# Patient Record
Sex: Male | Born: 1960 | Race: Black or African American | Hispanic: No | Marital: Single | Smoking: Never smoker
Health system: Southern US, Community
[De-identification: ages and names within clinical notes are randomized; demographics above are authoritative.]

## PROBLEM LIST (undated history)

## (undated) DIAGNOSIS — I4891 Unspecified atrial fibrillation: Secondary | ICD-10-CM

## (undated) DIAGNOSIS — I10 Essential (primary) hypertension: Secondary | ICD-10-CM

## (undated) DIAGNOSIS — I639 Cerebral infarction, unspecified: Secondary | ICD-10-CM

## (undated) DIAGNOSIS — D61818 Other pancytopenia: Secondary | ICD-10-CM

## (undated) DIAGNOSIS — E039 Hypothyroidism, unspecified: Secondary | ICD-10-CM

## (undated) DIAGNOSIS — D696 Thrombocytopenia, unspecified: Secondary | ICD-10-CM

## (undated) HISTORY — DX: Hypothyroidism, unspecified: E03.9

## (undated) HISTORY — DX: Unspecified atrial fibrillation: I48.91

## (undated) HISTORY — DX: Other pancytopenia: D61.818

## (undated) HISTORY — DX: Thrombocytopenia, unspecified: D69.6

## (undated) HISTORY — DX: Essential (primary) hypertension: I10

## (undated) HISTORY — PX: HERNIA REPAIR: SHX51

## (undated) HISTORY — DX: Cerebral infarction, unspecified: I63.9

---

## 2008-01-04 ENCOUNTER — Emergency Department (HOSPITAL_COMMUNITY): Admission: EM | Admit: 2008-01-04 | Discharge: 2008-01-04 | Payer: Self-pay | Admitting: Emergency Medicine

## 2008-01-05 ENCOUNTER — Ambulatory Visit: Payer: Self-pay | Admitting: Oncology

## 2008-01-10 LAB — COMPREHENSIVE METABOLIC PANEL
ALT: 9 U/L (ref 0–53)
AST: 14 U/L (ref 0–37)
Alkaline Phosphatase: 34 U/L — ABNORMAL LOW (ref 39–117)
CO2: 30 mEq/L (ref 19–32)
Sodium: 138 mEq/L (ref 135–145)
Total Bilirubin: 2.6 mg/dL — ABNORMAL HIGH (ref 0.3–1.2)
Total Protein: 7.4 g/dL (ref 6.0–8.3)

## 2008-01-10 LAB — CBC WITH DIFFERENTIAL/PLATELET
BASO%: 0.4 % (ref 0.0–2.0)
Basophils Absolute: 0 10*3/uL (ref 0.0–0.1)
EOS%: 4.3 % (ref 0.0–7.0)
HGB: 14.7 g/dL (ref 13.0–17.1)
MCH: 33 pg (ref 28.0–33.4)
RDW: 14.6 % (ref 11.2–14.6)
lymph#: 0.8 10*3/uL — ABNORMAL LOW (ref 0.9–3.3)

## 2008-01-10 LAB — TECHNOLOGIST REVIEW

## 2008-03-05 ENCOUNTER — Ambulatory Visit: Payer: Self-pay | Admitting: Oncology

## 2008-05-06 ENCOUNTER — Ambulatory Visit: Payer: Self-pay | Admitting: Oncology

## 2008-07-09 ENCOUNTER — Ambulatory Visit: Payer: Self-pay | Admitting: Oncology

## 2008-07-11 LAB — PROTIME-INR: INR: 2.2 (ref 2.00–3.50)

## 2008-07-11 LAB — CBC WITH DIFFERENTIAL/PLATELET
BASO%: 0.4 % (ref 0.0–2.0)
EOS%: 5.6 % (ref 0.0–7.0)
MCH: 31.4 pg (ref 27.2–33.4)
MCHC: 34.5 g/dL (ref 32.0–36.0)
RBC: 4.42 10*6/uL (ref 4.20–5.82)
RDW: 13.7 % (ref 11.0–14.6)
WBC: 2.5 10*3/uL — ABNORMAL LOW (ref 4.0–10.3)
lymph#: 1 10*3/uL (ref 0.9–3.3)
nRBC: 0 % (ref 0–0)

## 2008-07-11 LAB — CHCC SMEAR

## 2008-07-12 LAB — COMPREHENSIVE METABOLIC PANEL
ALT: 12 U/L (ref 0–53)
AST: 23 U/L (ref 0–37)
Albumin: 3.9 g/dL (ref 3.5–5.2)
Alkaline Phosphatase: 36 U/L — ABNORMAL LOW (ref 39–117)
BUN: 11 mg/dL (ref 6–23)
Potassium: 3.9 mEq/L (ref 3.5–5.3)
Sodium: 138 mEq/L (ref 135–145)
Total Protein: 7.7 g/dL (ref 6.0–8.3)

## 2008-07-12 LAB — ANTI-NUCLEAR AB-TITER (ANA TITER): ANA Titer 1: 1:40 {titer} — ABNORMAL HIGH

## 2008-07-12 LAB — ANA: Anti Nuclear Antibody(ANA): POSITIVE — AB

## 2008-10-04 ENCOUNTER — Ambulatory Visit (HOSPITAL_COMMUNITY): Admission: RE | Admit: 2008-10-04 | Discharge: 2008-10-04 | Payer: Self-pay | Admitting: Oncology

## 2008-10-08 ENCOUNTER — Ambulatory Visit: Payer: Self-pay | Admitting: Oncology

## 2008-10-10 LAB — CBC WITH DIFFERENTIAL/PLATELET
BASO%: 1.9 % (ref 0.0–2.0)
Basophils Absolute: 0 10*3/uL (ref 0.0–0.1)
EOS%: 6.6 % (ref 0.0–7.0)
HGB: 15.1 g/dL (ref 13.0–17.1)
MCH: 33.7 pg — ABNORMAL HIGH (ref 27.2–33.4)
MCHC: 35.3 g/dL (ref 32.0–36.0)
MCV: 95.5 fL (ref 79.3–98.0)
MONO%: 17.7 % — ABNORMAL HIGH (ref 0.0–14.0)
RBC: 4.47 10*6/uL (ref 4.20–5.82)
RDW: 14.9 % — ABNORMAL HIGH (ref 11.0–14.6)
lymph#: 0.9 10*3/uL (ref 0.9–3.3)

## 2008-10-14 LAB — COMPREHENSIVE METABOLIC PANEL
ALT: 12 U/L (ref 0–53)
AST: 18 U/L (ref 0–37)
Albumin: 3.9 g/dL (ref 3.5–5.2)
Alkaline Phosphatase: 35 U/L — ABNORMAL LOW (ref 39–117)
BUN: 14 mg/dL (ref 6–23)
Chloride: 103 mEq/L (ref 96–112)
Potassium: 3.7 mEq/L (ref 3.5–5.3)

## 2008-10-14 LAB — SPEP & IFE WITH QIG
Albumin ELP: 49.7 % — ABNORMAL LOW (ref 55.8–66.1)
Alpha-1-Globulin: 3.8 % (ref 2.9–4.9)
Alpha-2-Globulin: 8.9 % (ref 7.1–11.8)
IgM, Serum: 77 mg/dL (ref 60–263)

## 2008-11-22 ENCOUNTER — Ambulatory Visit: Payer: Self-pay | Admitting: Oncology

## 2008-11-25 ENCOUNTER — Encounter: Payer: Self-pay | Admitting: Oncology

## 2008-11-25 ENCOUNTER — Ambulatory Visit: Payer: Self-pay | Admitting: Oncology

## 2008-11-25 ENCOUNTER — Ambulatory Visit (HOSPITAL_COMMUNITY): Admission: RE | Admit: 2008-11-25 | Discharge: 2008-11-25 | Payer: Self-pay | Admitting: Oncology

## 2008-11-29 LAB — CBC WITH DIFFERENTIAL/PLATELET
BASO%: 1 % (ref 0.0–2.0)
Basophils Absolute: 0 10*3/uL (ref 0.0–0.1)
EOS%: 6.2 % (ref 0.0–7.0)
HCT: 42.6 % (ref 38.4–49.9)
HGB: 14.7 g/dL (ref 13.0–17.1)
LYMPH%: 32.6 % (ref 14.0–49.0)
MCH: 32.9 pg (ref 27.2–33.4)
MCHC: 34.4 g/dL (ref 32.0–36.0)
MCV: 95.7 fL (ref 79.3–98.0)
MONO%: 16.1 % — ABNORMAL HIGH (ref 0.0–14.0)
NEUT%: 44.1 % (ref 39.0–75.0)
Platelets: 56 10*3/uL — ABNORMAL LOW (ref 140–400)

## 2009-03-31 ENCOUNTER — Ambulatory Visit: Payer: Self-pay | Admitting: Oncology

## 2009-04-02 LAB — CBC WITH DIFFERENTIAL/PLATELET
BASO%: 0.4 % (ref 0.0–2.0)
EOS%: 5.5 % (ref 0.0–7.0)
LYMPH%: 37.9 % (ref 14.0–49.0)
MCH: 32.2 pg (ref 27.2–33.4)
MCHC: 33.9 g/dL (ref 32.0–36.0)
MONO#: 0.4 10*3/uL (ref 0.1–0.9)
NEUT%: 41.1 % (ref 39.0–75.0)
RBC: 4.04 10*6/uL — ABNORMAL LOW (ref 4.20–5.82)
WBC: 2.7 10*3/uL — ABNORMAL LOW (ref 4.0–10.3)
lymph#: 1 10*3/uL (ref 0.9–3.3)
nRBC: 0 % (ref 0–0)

## 2009-04-02 LAB — COMPREHENSIVE METABOLIC PANEL
ALT: 11 U/L (ref 0–53)
AST: 16 U/L (ref 0–37)
Alkaline Phosphatase: 40 U/L (ref 39–117)
BUN: 11 mg/dL (ref 6–23)
Calcium: 8.1 mg/dL — ABNORMAL LOW (ref 8.4–10.5)
Chloride: 101 mEq/L (ref 96–112)
Creatinine, Ser: 0.94 mg/dL (ref 0.40–1.50)

## 2009-07-30 ENCOUNTER — Ambulatory Visit: Payer: Self-pay | Admitting: Oncology

## 2009-09-04 ENCOUNTER — Ambulatory Visit: Payer: Self-pay | Admitting: Oncology

## 2009-09-05 LAB — CBC WITH DIFFERENTIAL/PLATELET
BASO%: 0.3 % (ref 0.0–2.0)
EOS%: 6.2 % (ref 0.0–7.0)
MCH: 31.9 pg (ref 27.2–33.4)
MCHC: 33.9 g/dL (ref 32.0–36.0)
MONO#: 0.5 10*3/uL (ref 0.1–0.9)
RBC: 4.04 10*6/uL — ABNORMAL LOW (ref 4.20–5.82)
RDW: 14.5 % (ref 11.0–14.6)
WBC: 2.9 10*3/uL — ABNORMAL LOW (ref 4.0–10.3)
lymph#: 1.1 10*3/uL (ref 0.9–3.3)
nRBC: 0 % (ref 0–0)

## 2009-09-05 LAB — COMPREHENSIVE METABOLIC PANEL
ALT: 15 U/L (ref 0–53)
AST: 22 U/L (ref 0–37)
Albumin: 3.6 g/dL (ref 3.5–5.2)
Alkaline Phosphatase: 35 U/L — ABNORMAL LOW (ref 39–117)
Calcium: 8.6 mg/dL (ref 8.4–10.5)
Chloride: 106 mEq/L (ref 96–112)
Potassium: 3.9 mEq/L (ref 3.5–5.3)

## 2010-02-03 ENCOUNTER — Ambulatory Visit: Payer: Self-pay | Admitting: Oncology

## 2010-02-05 LAB — COMPREHENSIVE METABOLIC PANEL
ALT: 12 U/L (ref 0–53)
Albumin: 3.7 g/dL (ref 3.5–5.2)
CO2: 32 mEq/L (ref 19–32)
Calcium: 8.9 mg/dL (ref 8.4–10.5)
Chloride: 103 mEq/L (ref 96–112)
Creatinine, Ser: 0.92 mg/dL (ref 0.40–1.50)
Potassium: 4 mEq/L (ref 3.5–5.3)
Sodium: 139 mEq/L (ref 135–145)
Total Protein: 8.2 g/dL (ref 6.0–8.3)

## 2010-02-05 LAB — CBC WITH DIFFERENTIAL/PLATELET
Eosinophils Absolute: 0.2 10*3/uL (ref 0.0–0.5)
HCT: 39.8 % (ref 38.4–49.9)
LYMPH%: 30.6 % (ref 14.0–49.0)
MONO#: 0.4 10*3/uL (ref 0.1–0.9)
NEUT#: 1.4 10*3/uL — ABNORMAL LOW (ref 1.5–6.5)
Platelets: 54 10*3/uL — ABNORMAL LOW (ref 140–400)
RBC: 4.24 10*6/uL (ref 4.20–5.82)
WBC: 2.8 10*3/uL — ABNORMAL LOW (ref 4.0–10.3)
lymph#: 0.9 10*3/uL (ref 0.9–3.3)
nRBC: 0 % (ref 0–0)

## 2010-05-10 ENCOUNTER — Encounter: Payer: Self-pay | Admitting: Oncology

## 2010-07-25 LAB — DIFFERENTIAL
Basophils Absolute: 0 10*3/uL (ref 0.0–0.1)
Lymphocytes Relative: 36 % (ref 12–46)
Monocytes Absolute: 0.4 10*3/uL (ref 0.1–1.0)
Neutro Abs: 1.3 10*3/uL — ABNORMAL LOW (ref 1.7–7.7)
Neutrophils Relative %: 44 % (ref 43–77)

## 2010-07-25 LAB — CBC
HCT: 41.8 % (ref 39.0–52.0)
Hemoglobin: 14 g/dL (ref 13.0–17.0)
MCHC: 33.4 g/dL (ref 30.0–36.0)
Platelets: 60 10*3/uL — ABNORMAL LOW (ref 150–400)
RDW: 14.9 % (ref 11.5–15.5)

## 2010-07-25 LAB — CHROMOSOME ANALYSIS, BONE MARROW

## 2010-07-25 LAB — BONE MARROW EXAM

## 2010-08-07 ENCOUNTER — Other Ambulatory Visit: Payer: Self-pay | Admitting: Oncology

## 2010-08-07 ENCOUNTER — Encounter (HOSPITAL_BASED_OUTPATIENT_CLINIC_OR_DEPARTMENT_OTHER): Payer: Medicaid Other | Admitting: Oncology

## 2010-08-07 DIAGNOSIS — D696 Thrombocytopenia, unspecified: Secondary | ICD-10-CM

## 2010-08-07 DIAGNOSIS — D709 Neutropenia, unspecified: Secondary | ICD-10-CM

## 2010-08-07 DIAGNOSIS — R7989 Other specified abnormal findings of blood chemistry: Secondary | ICD-10-CM

## 2010-08-07 LAB — CBC WITH DIFFERENTIAL/PLATELET
BASO%: 0.3 % (ref 0.0–2.0)
Eosinophils Absolute: 0.1 10*3/uL (ref 0.0–0.5)
MONO#: 0.4 10*3/uL (ref 0.1–0.9)
NEUT#: 1.6 10*3/uL (ref 1.5–6.5)
Platelets: 60 10*3/uL — ABNORMAL LOW (ref 140–400)
RBC: 4.12 10*6/uL — ABNORMAL LOW (ref 4.20–5.82)
RDW: 14 % (ref 11.0–14.6)
WBC: 3.1 10*3/uL — ABNORMAL LOW (ref 4.0–10.3)
lymph#: 1 10*3/uL (ref 0.9–3.3)
nRBC: 0 % (ref 0–0)

## 2010-08-07 LAB — COMPREHENSIVE METABOLIC PANEL
ALT: 13 U/L (ref 0–53)
AST: 21 U/L (ref 0–37)
CO2: 27 mEq/L (ref 19–32)
Calcium: 8.5 mg/dL (ref 8.4–10.5)
Chloride: 105 mEq/L (ref 96–112)
Potassium: 4.1 mEq/L (ref 3.5–5.3)
Sodium: 137 mEq/L (ref 135–145)
Total Protein: 7.8 g/dL (ref 6.0–8.3)

## 2010-09-01 NOTE — Op Note (Signed)
Bryce Hill, Bryce Hill             ACCOUNT NO.:  [id number]   MEDICAL RECORD NO.:  [id number]          PATIENT TYPE:  AMB   LOCATION:  OMED                         FACILITY:  [facility]   PHYSICIAN:  Firas N. Shadad        DATE OF BIRTH:  [birth year]   DATE OF PROCEDURE:  11/25/2008  DATE OF DISCHARGE:  11/25/2008                               OPERATIVE REPORT   INDICATION:  A 50 year old gentleman with neutropenia and  thrombocytopenia and bone marrow biopsy to rule out any infiltrative  bone marrow process.   DESCRIPTION OF PROCEDURE:  The risks and benefits of procedure were  described to [name].  Informed consent was obtained afterwards.  The patient was brought into short stay at [facility].  IV  access was obtained, and cardiac monitoring was established.  The  patient was placed in the decubitus position exposing his left iliac  crest.  Skin was prepped and draped in a sterile fashion using Betadine  and sterile drapes.  Conscious sedation was obtained utilizing 4 mg of  Versed.  Skin, subcutaneous tissue, and periosteum was anesthetized  using 2% lidocaine.  Using #11 blade scalpel, a small incision was made.  Using the aspirate needle, aspirate was obtained without any difficulty.  The specimen was sent for flow and cytogenetics.  Using the Jamshidi  needle, biopsy was obtained without any difficulty.  The patient  tolerated the procedure well, and there was no bleeding or any other  complications.  The patient was instructed to lie flat for the next 45  minutes.  The patient was to follow-up on August 13 to discuss the  results.           ______________________________  Blenda Nicely [facility]  Electronically Signed     FNS/MEDQ  D:  11/26/2008  T:  11/26/2008  Job:  [id number]

## 2011-01-18 LAB — CBC
Hemoglobin: 15.6
MCHC: 33.8
MCV: 98.2
RBC: 4.71
RDW: 15

## 2011-01-18 LAB — DIFFERENTIAL
Basophils Absolute: 0
Basophils Relative: 1
Eosinophils Absolute: 0.2
Eosinophils Relative: 7 — ABNORMAL HIGH
Monocytes Absolute: 0.4
Monocytes Relative: 15 — ABNORMAL HIGH

## 2011-02-10 ENCOUNTER — Encounter (HOSPITAL_BASED_OUTPATIENT_CLINIC_OR_DEPARTMENT_OTHER): Payer: Medicaid Other | Admitting: Oncology

## 2011-02-10 ENCOUNTER — Other Ambulatory Visit: Payer: Self-pay | Admitting: Oncology

## 2011-02-10 ENCOUNTER — Encounter: Payer: Self-pay | Admitting: *Deleted

## 2011-02-10 DIAGNOSIS — D709 Neutropenia, unspecified: Secondary | ICD-10-CM

## 2011-02-10 DIAGNOSIS — D696 Thrombocytopenia, unspecified: Secondary | ICD-10-CM

## 2011-02-10 DIAGNOSIS — R7989 Other specified abnormal findings of blood chemistry: Secondary | ICD-10-CM

## 2011-02-10 LAB — COMPREHENSIVE METABOLIC PANEL
Albumin: 4 g/dL (ref 3.5–5.2)
BUN: 16 mg/dL (ref 6–23)
CO2: 22 mEq/L (ref 19–32)
Calcium: 8.7 mg/dL (ref 8.4–10.5)
Chloride: 107 mEq/L (ref 96–112)
Creatinine, Ser: 0.88 mg/dL (ref 0.50–1.35)
Potassium: 3.9 mEq/L (ref 3.5–5.3)

## 2011-02-10 LAB — CBC WITH DIFFERENTIAL/PLATELET
Basophils Absolute: 0 10*3/uL (ref 0.0–0.1)
EOS%: 2.3 % (ref 0.0–7.0)
Eosinophils Absolute: 0.1 10*3/uL (ref 0.0–0.5)
HCT: 35.2 % — ABNORMAL LOW (ref 38.4–49.9)
MCH: 31.6 pg (ref 27.2–33.4)
MCHC: 33.8 g/dL (ref 32.0–36.0)
MCV: 93.4 fL (ref 79.3–98.0)
Platelets: 61 10*3/uL — ABNORMAL LOW (ref 140–400)
RBC: 3.77 10*6/uL — ABNORMAL LOW (ref 4.20–5.82)
WBC: 2.6 10*3/uL — ABNORMAL LOW (ref 4.0–10.3)
lymph#: 0.9 10*3/uL (ref 0.9–3.3)
nRBC: 0 % (ref 0–0)

## 2011-05-14 DIAGNOSIS — I4891 Unspecified atrial fibrillation: Secondary | ICD-10-CM | POA: Diagnosis not present

## 2011-05-28 DIAGNOSIS — I10 Essential (primary) hypertension: Secondary | ICD-10-CM | POA: Diagnosis not present

## 2011-05-28 DIAGNOSIS — Z6832 Body mass index (BMI) 32.0-32.9, adult: Secondary | ICD-10-CM | POA: Diagnosis not present

## 2011-05-28 DIAGNOSIS — E038 Other specified hypothyroidism: Secondary | ICD-10-CM | POA: Diagnosis not present

## 2011-06-08 DIAGNOSIS — I669 Occlusion and stenosis of unspecified cerebral artery: Secondary | ICD-10-CM | POA: Diagnosis not present

## 2011-06-08 DIAGNOSIS — E039 Hypothyroidism, unspecified: Secondary | ICD-10-CM | POA: Diagnosis not present

## 2011-06-08 DIAGNOSIS — E782 Mixed hyperlipidemia: Secondary | ICD-10-CM | POA: Diagnosis not present

## 2011-06-08 DIAGNOSIS — I6789 Other cerebrovascular disease: Secondary | ICD-10-CM | POA: Diagnosis not present

## 2011-06-08 DIAGNOSIS — D696 Thrombocytopenia, unspecified: Secondary | ICD-10-CM | POA: Diagnosis not present

## 2011-06-08 DIAGNOSIS — I10 Essential (primary) hypertension: Secondary | ICD-10-CM | POA: Diagnosis not present

## 2011-06-08 DIAGNOSIS — Z79899 Other long term (current) drug therapy: Secondary | ICD-10-CM | POA: Diagnosis not present

## 2011-06-08 DIAGNOSIS — I4891 Unspecified atrial fibrillation: Secondary | ICD-10-CM | POA: Diagnosis not present

## 2011-06-09 DIAGNOSIS — S9030XA Contusion of unspecified foot, initial encounter: Secondary | ICD-10-CM | POA: Diagnosis not present

## 2011-07-08 DIAGNOSIS — I119 Hypertensive heart disease without heart failure: Secondary | ICD-10-CM | POA: Diagnosis not present

## 2011-07-08 DIAGNOSIS — E039 Hypothyroidism, unspecified: Secondary | ICD-10-CM | POA: Diagnosis not present

## 2011-07-08 DIAGNOSIS — I669 Occlusion and stenosis of unspecified cerebral artery: Secondary | ICD-10-CM | POA: Diagnosis not present

## 2011-07-08 DIAGNOSIS — I4891 Unspecified atrial fibrillation: Secondary | ICD-10-CM | POA: Diagnosis not present

## 2011-08-05 DIAGNOSIS — I119 Hypertensive heart disease without heart failure: Secondary | ICD-10-CM | POA: Diagnosis not present

## 2011-08-05 DIAGNOSIS — I4891 Unspecified atrial fibrillation: Secondary | ICD-10-CM | POA: Diagnosis not present

## 2011-08-05 DIAGNOSIS — E039 Hypothyroidism, unspecified: Secondary | ICD-10-CM | POA: Diagnosis not present

## 2011-08-05 DIAGNOSIS — I669 Occlusion and stenosis of unspecified cerebral artery: Secondary | ICD-10-CM | POA: Diagnosis not present

## 2011-08-19 ENCOUNTER — Telehealth: Payer: Self-pay | Admitting: Oncology

## 2011-08-19 ENCOUNTER — Other Ambulatory Visit (HOSPITAL_BASED_OUTPATIENT_CLINIC_OR_DEPARTMENT_OTHER): Payer: Medicaid Other | Admitting: Lab

## 2011-08-19 ENCOUNTER — Ambulatory Visit (HOSPITAL_BASED_OUTPATIENT_CLINIC_OR_DEPARTMENT_OTHER): Payer: Medicaid Other | Admitting: Oncology

## 2011-08-19 VITALS — BP 121/67 | HR 60 | Temp 97.7°F | Ht 70.5 in | Wt 236.9 lb

## 2011-08-19 DIAGNOSIS — D696 Thrombocytopenia, unspecified: Secondary | ICD-10-CM | POA: Diagnosis not present

## 2011-08-19 DIAGNOSIS — R7989 Other specified abnormal findings of blood chemistry: Secondary | ICD-10-CM | POA: Diagnosis not present

## 2011-08-19 DIAGNOSIS — D709 Neutropenia, unspecified: Secondary | ICD-10-CM | POA: Diagnosis not present

## 2011-08-19 DIAGNOSIS — D72810 Lymphocytopenia: Secondary | ICD-10-CM | POA: Diagnosis not present

## 2011-08-19 LAB — CBC WITH DIFFERENTIAL/PLATELET
BASO%: 0.9 % (ref 0.0–2.0)
Basophils Absolute: 0 10*3/uL (ref 0.0–0.1)
EOS%: 4.7 % (ref 0.0–7.0)
HCT: 38.9 % (ref 38.4–49.9)
HGB: 13.2 g/dL (ref 13.0–17.1)
MCH: 33.1 pg (ref 27.2–33.4)
MCHC: 33.9 g/dL (ref 32.0–36.0)
MONO#: 0.4 10*3/uL (ref 0.1–0.9)
NEUT%: 49.8 % (ref 39.0–75.0)
RDW: 14.5 % (ref 11.0–14.6)
WBC: 2.8 10*3/uL — ABNORMAL LOW (ref 4.0–10.3)
lymph#: 0.9 10*3/uL (ref 0.9–3.3)

## 2011-08-19 LAB — COMPREHENSIVE METABOLIC PANEL
AST: 21 U/L (ref 0–37)
Albumin: 3.7 g/dL (ref 3.5–5.2)
Alkaline Phosphatase: 41 U/L (ref 39–117)
BUN: 15 mg/dL (ref 6–23)
Calcium: 8.8 mg/dL (ref 8.4–10.5)
Chloride: 101 mEq/L (ref 96–112)
Potassium: 3.8 mEq/L (ref 3.5–5.3)
Sodium: 136 mEq/L (ref 135–145)
Total Protein: 8.3 g/dL (ref 6.0–8.3)

## 2011-08-19 NOTE — Telephone Encounter (Signed)
Gv pt appt for nov2013 °

## 2011-08-19 NOTE — Progress Notes (Signed)
Hematology and Oncology Follow Up Visit  Bryce Hill [id number] Apr 05, 1961 51 y.o. 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  Prior Therapy:  Status post bone marrow biopsy done in August 2010, did not show any infiltrative bone marrow involvement.  Current therapy: Observation and surveillance.  Interim History:  Bryce Hill presents today for a followup visit, a very pleasant gentleman, continued to reside in a rest home and has not really had any recent illnesses or hospitalization.  He has continued to perform activities of daily living without any hindrance or decline.  He had recent teeth extraction and dental work and was not really complicated with any excessive bleeding.  Other than that, had not had any hospitalization, had not had any illnesses, had not any constitutional symptoms. No other bleeding episods noted.   Medications: I have reviewed the patient's current medications. Current outpatient prescriptions:amLODipine (NORVASC) 5 MG tablet, Take 5 mg by mouth daily.  , Disp: , Rfl: ;  carvedilol (COREG) 12.5 MG tablet, Take 12.5 mg by mouth 2 (two) times daily with a meal.  , Disp: , Rfl: ;  levothyroxine (SYNTHROID, LEVOTHROID) 100 MCG tablet, Take 100 mcg by mouth daily.  , Disp: , Rfl: ;  potassium chloride SA (K-DUR,KLOR-CON) 20 MEQ tablet, Take 20 mEq by mouth 2 (two) times daily.  , Disp: , Rfl:  Psyllium (QC FIBER LAXATIVE PO), Take 625 mg by mouth daily. 2 tabs , Disp: , Rfl: ;  warfarin (COUMADIN) 5 MG tablet, Take 5 mg by mouth daily.  , Disp: , Rfl:   Allergies: Allergies no known allergies  Past Medical History, Surgical history, Social history, and Family History were reviewed and updated.  Review of Systems: Constitutional:  Negative for fever, chills, night sweats, anorexia, weight loss, pain. Cardiovascular: no  chest pain or dyspnea on exertion Respiratory: negative Neurological: negative Dermatological: negative ENT: negative Skin: Negative. Gastrointestinal: no abdominal pain, change in bowel habits, or black or bloody stools Genito-Urinary: negative Hematological and Lymphatic: negative Breast: negative Musculoskeletal: negative Remaining ROS negative. Physical Exam: Blood pressure 121/67, pulse 60, temperature 97.7 F (36.5 C), temperature source Oral, height 5' 10.5" (1.791 m), weight 236 lb 14.4 oz (107.457 kg). ECOG: 1 General appearance: alert Head: Normocephalic, without obvious abnormality, atraumatic Neck: no adenopathy, no carotid bruit, no JVD, supple, symmetrical, trachea midline and thyroid not enlarged, symmetric, no tenderness/mass/nodules Lymph nodes: Cervical, supraclavicular, and axillary nodes normal. Heart:regular rate and rhythm, S1, S2 normal, no murmur, click, rub or gallop Lung:chest clear, no wheezing, rales, normal symmetric air entry Abdomin: soft, non-tender, without masses or organomegaly EXT:no erythema, induration, or nodules   Lab Results: Lab Results  Component Value Date   WBC 2.8* 08/19/2011   HGB 13.2 08/19/2011   HCT 38.9 08/19/2011   MCV 97.6 08/19/2011   PLT 71* 08/19/2011     Chemistry      Component Value Date/Time   NA 140 02/10/2011 1438   K 3.9 02/10/2011 1438   CL 107 02/10/2011 1438   CO2 22 02/10/2011 1438   BUN 16 02/10/2011 1438   CREATININE 0.88 02/10/2011 1438      Component Value Date/Time   CALCIUM 8.7 02/10/2011 1438   ALKPHOS 36* 02/10/2011 1438   AST 18 02/10/2011 1438   ALT 9 02/10/2011 1438   BILITOT 1.4* 02/10/2011 1438      Impression and Plan:  A 51 year old gentleman with the following issues: 1. Thrombocytopenia.  Differential diagnosis includes myelodysplasia versus autoimmune disorder or possibly also sequestration due to hypersplenism.  For the time being, his counts have been adequate for the last 2.5 years,  no need for any intervention at this time.  He does not really require any transfusion support at this time. 2. Followup will be in 6 months' time.  Eli Hose, MD 5/2/20133:49 PM

## 2011-08-23 DIAGNOSIS — S8000XA Contusion of unspecified knee, initial encounter: Secondary | ICD-10-CM | POA: Diagnosis not present

## 2011-08-23 DIAGNOSIS — I10 Essential (primary) hypertension: Secondary | ICD-10-CM | POA: Diagnosis not present

## 2011-08-23 DIAGNOSIS — E038 Other specified hypothyroidism: Secondary | ICD-10-CM | POA: Diagnosis not present

## 2011-08-23 DIAGNOSIS — M2550 Pain in unspecified joint: Secondary | ICD-10-CM | POA: Diagnosis not present

## 2011-08-26 DIAGNOSIS — S8000XA Contusion of unspecified knee, initial encounter: Secondary | ICD-10-CM | POA: Diagnosis not present

## 2011-08-26 DIAGNOSIS — M25569 Pain in unspecified knee: Secondary | ICD-10-CM | POA: Diagnosis not present

## 2011-09-08 DIAGNOSIS — I4891 Unspecified atrial fibrillation: Secondary | ICD-10-CM | POA: Diagnosis not present

## 2011-09-08 DIAGNOSIS — E039 Hypothyroidism, unspecified: Secondary | ICD-10-CM | POA: Diagnosis not present

## 2011-09-08 DIAGNOSIS — I119 Hypertensive heart disease without heart failure: Secondary | ICD-10-CM | POA: Diagnosis not present

## 2011-09-08 DIAGNOSIS — I669 Occlusion and stenosis of unspecified cerebral artery: Secondary | ICD-10-CM | POA: Diagnosis not present

## 2011-09-17 DIAGNOSIS — E039 Hypothyroidism, unspecified: Secondary | ICD-10-CM | POA: Diagnosis not present

## 2011-09-17 DIAGNOSIS — I10 Essential (primary) hypertension: Secondary | ICD-10-CM | POA: Diagnosis not present

## 2011-09-17 DIAGNOSIS — K5901 Slow transit constipation: Secondary | ICD-10-CM | POA: Diagnosis not present

## 2011-09-17 DIAGNOSIS — K5900 Constipation, unspecified: Secondary | ICD-10-CM | POA: Diagnosis not present

## 2011-09-17 DIAGNOSIS — I6789 Other cerebrovascular disease: Secondary | ICD-10-CM | POA: Diagnosis not present

## 2011-09-17 DIAGNOSIS — R7309 Other abnormal glucose: Secondary | ICD-10-CM | POA: Diagnosis not present

## 2011-09-29 DIAGNOSIS — I6789 Other cerebrovascular disease: Secondary | ICD-10-CM | POA: Diagnosis not present

## 2011-09-29 DIAGNOSIS — E039 Hypothyroidism, unspecified: Secondary | ICD-10-CM | POA: Diagnosis not present

## 2011-09-29 DIAGNOSIS — I10 Essential (primary) hypertension: Secondary | ICD-10-CM | POA: Diagnosis not present

## 2011-10-04 DIAGNOSIS — E039 Hypothyroidism, unspecified: Secondary | ICD-10-CM | POA: Diagnosis not present

## 2011-10-04 DIAGNOSIS — Z79899 Other long term (current) drug therapy: Secondary | ICD-10-CM | POA: Diagnosis not present

## 2011-10-04 DIAGNOSIS — I4891 Unspecified atrial fibrillation: Secondary | ICD-10-CM | POA: Diagnosis not present

## 2011-10-04 DIAGNOSIS — I10 Essential (primary) hypertension: Secondary | ICD-10-CM | POA: Diagnosis not present

## 2011-10-04 DIAGNOSIS — I619 Nontraumatic intracerebral hemorrhage, unspecified: Secondary | ICD-10-CM | POA: Diagnosis not present

## 2011-10-04 DIAGNOSIS — D696 Thrombocytopenia, unspecified: Secondary | ICD-10-CM | POA: Diagnosis not present

## 2011-10-07 DIAGNOSIS — I119 Hypertensive heart disease without heart failure: Secondary | ICD-10-CM | POA: Diagnosis not present

## 2011-10-22 DIAGNOSIS — I4891 Unspecified atrial fibrillation: Secondary | ICD-10-CM | POA: Diagnosis not present

## 2011-11-02 DIAGNOSIS — E039 Hypothyroidism, unspecified: Secondary | ICD-10-CM | POA: Diagnosis not present

## 2011-11-09 DIAGNOSIS — I119 Hypertensive heart disease without heart failure: Secondary | ICD-10-CM | POA: Diagnosis not present

## 2011-12-10 DIAGNOSIS — I4891 Unspecified atrial fibrillation: Secondary | ICD-10-CM | POA: Diagnosis not present

## 2011-12-10 DIAGNOSIS — I119 Hypertensive heart disease without heart failure: Secondary | ICD-10-CM | POA: Diagnosis not present

## 2011-12-14 ENCOUNTER — Encounter (HOSPITAL_COMMUNITY): Payer: Self-pay | Admitting: Oncology

## 2011-12-14 ENCOUNTER — Encounter (HOSPITAL_COMMUNITY): Payer: Medicare Other | Attending: Oncology | Admitting: Oncology

## 2011-12-14 VITALS — BP 109/69 | HR 62 | Temp 97.3°F | Resp 18 | Ht 69.0 in | Wt 231.8 lb

## 2011-12-14 DIAGNOSIS — I10 Essential (primary) hypertension: Secondary | ICD-10-CM | POA: Diagnosis not present

## 2011-12-14 DIAGNOSIS — I69998 Other sequelae following unspecified cerebrovascular disease: Secondary | ICD-10-CM | POA: Insufficient documentation

## 2011-12-14 DIAGNOSIS — D696 Thrombocytopenia, unspecified: Secondary | ICD-10-CM

## 2011-12-14 DIAGNOSIS — R5381 Other malaise: Secondary | ICD-10-CM | POA: Diagnosis not present

## 2011-12-14 DIAGNOSIS — E669 Obesity, unspecified: Secondary | ICD-10-CM | POA: Diagnosis not present

## 2011-12-14 DIAGNOSIS — I69928 Other speech and language deficits following unspecified cerebrovascular disease: Secondary | ICD-10-CM | POA: Diagnosis not present

## 2011-12-14 DIAGNOSIS — R5383 Other fatigue: Secondary | ICD-10-CM | POA: Insufficient documentation

## 2011-12-14 DIAGNOSIS — D72819 Decreased white blood cell count, unspecified: Secondary | ICD-10-CM | POA: Diagnosis not present

## 2011-12-14 LAB — CBC WITH DIFFERENTIAL/PLATELET
Basophils Relative: 1 % (ref 0–1)
Eosinophils Relative: 5 % (ref 0–5)
Hemoglobin: 12.3 g/dL — ABNORMAL LOW (ref 13.0–17.0)
Lymphs Abs: 0.8 10*3/uL (ref 0.7–4.0)
MCH: 31.8 pg (ref 26.0–34.0)
MCV: 92.8 fL (ref 78.0–100.0)
Monocytes Absolute: 0.3 10*3/uL (ref 0.1–1.0)
Monocytes Relative: 16 % — ABNORMAL HIGH (ref 3–12)
Neutrophils Relative %: 41 % — ABNORMAL LOW (ref 43–77)
RBC: 3.87 MIL/uL — ABNORMAL LOW (ref 4.22–5.81)

## 2011-12-14 LAB — COMPREHENSIVE METABOLIC PANEL
AST: 19 U/L (ref 0–37)
Alkaline Phosphatase: 41 U/L (ref 39–117)
CO2: 26 mEq/L (ref 19–32)
Chloride: 101 mEq/L (ref 96–112)
Creatinine, Ser: 0.95 mg/dL (ref 0.50–1.35)
GFR calc non Af Amer: >90 mL/min (ref >90)
Potassium: 3.9 mEq/L (ref 3.5–5.1)
Total Bilirubin: 1.4 mg/dL — ABNORMAL HIGH (ref 0.3–1.2)

## 2011-12-14 NOTE — Progress Notes (Signed)
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 His speech is halting and very difficult to understand. Yes and no answers are much easier for this gentleman. He is accompanied by one of the individuals who works at the home. He does not have bleeding that's obvious no increase in infections etc. He did have a bone marrow biopsy 3 years ago which did not reveal distinct changes as a cause for his blood count abnormalities. His Dr. in [place] has just been watching him. He does not smoke presently nor does he drink presently. He is not working. His vital signs are recorded. He is 5 feet 9 inches tall and weighs 231 pounds. His BMI is 34.3. He has no palpable lymphadenopathy in any location. He has clear lungs. His right hand is weaker than his left and his right leg is weaker than his left. He has chronic edema of both legs slightly worse on the right than the left. He is pigmented changes to his skin consistent with chronic edema. Pulses in his feet are difficult to feel perfectly well. His abdomen is soft obese without distinct organomegaly but there is fullness in the left upper quadrant. Bowel sounds are normal. He has prominent breast tissue but no true gynecomastia. His heart shows a regular rhythm and rate without murmur rub or gallop. At one point I thought I heard a soft systolic murmur but could not reproduce. He has no CVA tenderness. He has poor dental hygiene with multiple missing teeth. Tongue is unremarkable. He has minimal right lower facial weakness. He also has slight flattening of his nails.  I do think he needs a CAT scan of his abdomen to assess his spleen and liver size and contour. He also needs a few repeat blood tests. We will then see him  back

## 2011-12-14 NOTE — Progress Notes (Signed)
Bryce Hill presented for labwork. Labs per MD order drawn via Peripheral Line 23 gauge needle inserted in right AC   Good blood return present. Procedure without incident.  Needle removed intact. Patient tolerated procedure well.

## 2011-12-14 NOTE — Patient Instructions (Addendum)
Shaune Westfall  DOB April 21, 1960 CSN [id number]  MRN [id number] Dr. Glenford Peers   [facility]  Discharge Instructions  RECOMMENDATIONS MADE BY THE CONSULTANT AND ANY TEST RESULTS WILL BE SENT TO YOUR REFERRING DOCTOR.   EXAM FINDINGS BY MD TODAY AND SIGNS AND SYMPTOMS TO REPORT TO CLINIC OR PRIMARY MD: Exam and discussion by Dr. Mariel Sleet.  We need to check some blood work and do a CT scan of your abdomen.  Once we get all of the results, we'll get you back to discuss any treatment options.  MEDICATIONS PRESCRIBED: none   INSTRUCTIONS GIVEN AND DISCUSSED: Other :  Prep for CT scan reviewed and oral contrast given.  SPECIAL INSTRUCTIONS/FOLLOW-UP: Lab work Needed today, Xray Studies Needed :  CT of your abdomen and Return to Clinic in 1 month to see our Surveyor, mining.   I acknowledge that I have been informed and understand all the instructions given to me and received a copy. I do not have any more questions at this time, but understand that I may call the Specialty Clinic at [facility] at [phone] during business hours should I have any further questions or need assistance in obtaining follow-up care.    __________________________________________  _____________  __________ Signature of Patient or Authorized Representative            Date                   Time    __________________________________________ Nurse's Signature

## 2011-12-15 LAB — IRON AND TIBC: Iron: 82 ug/dL (ref 42–135)

## 2011-12-16 ENCOUNTER — Ambulatory Visit (HOSPITAL_COMMUNITY): Payer: Medicare Other

## 2011-12-17 ENCOUNTER — Ambulatory Visit (HOSPITAL_COMMUNITY): Payer: Medicare Other | Admitting: Oncology

## 2011-12-21 DIAGNOSIS — I4891 Unspecified atrial fibrillation: Secondary | ICD-10-CM | POA: Diagnosis not present

## 2011-12-24 ENCOUNTER — Ambulatory Visit (HOSPITAL_COMMUNITY)
Admission: RE | Admit: 2011-12-24 | Discharge: 2011-12-24 | Disposition: A | Discharge status: Home / Self Care | Payer: Medicare Other | Source: Ambulatory Visit | Attending: Oncology | Admitting: Oncology

## 2011-12-24 DIAGNOSIS — D696 Thrombocytopenia, unspecified: Secondary | ICD-10-CM | POA: Diagnosis not present

## 2011-12-30 DIAGNOSIS — I6789 Other cerebrovascular disease: Secondary | ICD-10-CM | POA: Diagnosis not present

## 2011-12-30 DIAGNOSIS — I499 Cardiac arrhythmia, unspecified: Secondary | ICD-10-CM | POA: Diagnosis not present

## 2011-12-30 DIAGNOSIS — Z23 Encounter for immunization: Secondary | ICD-10-CM | POA: Diagnosis not present

## 2011-12-30 DIAGNOSIS — E039 Hypothyroidism, unspecified: Secondary | ICD-10-CM | POA: Diagnosis not present

## 2012-01-04 DIAGNOSIS — I10 Essential (primary) hypertension: Secondary | ICD-10-CM | POA: Diagnosis not present

## 2012-01-04 DIAGNOSIS — E039 Hypothyroidism, unspecified: Secondary | ICD-10-CM | POA: Diagnosis not present

## 2012-01-04 DIAGNOSIS — D696 Thrombocytopenia, unspecified: Secondary | ICD-10-CM | POA: Diagnosis not present

## 2012-01-04 DIAGNOSIS — Z79899 Other long term (current) drug therapy: Secondary | ICD-10-CM | POA: Diagnosis not present

## 2012-01-11 ENCOUNTER — Encounter (HOSPITAL_COMMUNITY): Payer: Medicare Other | Attending: Oncology | Admitting: Oncology

## 2012-01-11 ENCOUNTER — Encounter (HOSPITAL_COMMUNITY): Payer: Self-pay | Admitting: Oncology

## 2012-01-11 VITALS — BP 104/78 | HR 62 | Temp 97.3°F | Resp 16 | Wt 227.4 lb

## 2012-01-11 DIAGNOSIS — D61818 Other pancytopenia: Secondary | ICD-10-CM

## 2012-01-11 HISTORY — DX: Other pancytopenia: D61.818

## 2012-01-11 LAB — CBC WITH DIFFERENTIAL/PLATELET
Basophils Relative: 1 % (ref 0–1)
Eosinophils Absolute: 0.2 10*3/uL (ref 0.0–0.7)
HCT: 37.8 % — ABNORMAL LOW (ref 39.0–52.0)
Hemoglobin: 12.9 g/dL — ABNORMAL LOW (ref 13.0–17.0)
Lymphs Abs: 0.9 10*3/uL (ref 0.7–4.0)
MCH: 32.1 pg (ref 26.0–34.0)
MCHC: 34.1 g/dL (ref 30.0–36.0)
Monocytes Absolute: 0.4 10*3/uL (ref 0.1–1.0)
Monocytes Relative: 17 % — ABNORMAL HIGH (ref 3–12)
RBC: 4.02 MIL/uL — ABNORMAL LOW (ref 4.22–5.81)

## 2012-01-11 LAB — COMPREHENSIVE METABOLIC PANEL
Albumin: 3.6 g/dL (ref 3.5–5.2)
Alkaline Phosphatase: 42 U/L (ref 39–117)
BUN: 16 mg/dL (ref 6–23)
Chloride: 102 mEq/L (ref 96–112)
Creatinine, Ser: 0.94 mg/dL (ref 0.50–1.35)
GFR calc Af Amer: >90 mL/min (ref >90)
Glucose, Bld: 111 mg/dL — ABNORMAL HIGH (ref 70–99)
Total Bilirubin: 1.4 mg/dL — ABNORMAL HIGH (ref 0.3–1.2)
Total Protein: 8.1 g/dL (ref 6.0–8.3)

## 2012-01-11 NOTE — Patient Instructions (Addendum)
[facility]  Discharge Instructions  RECOMMENDATIONS MADE BY THE CONSULTANT AND ANY TEST RESULTS WILL BE SENT TO YOUR REFERRING DOCTOR.   Lab work today. We will call if there are any abnormal results. Return to clinic in 3 months to see MD.   I acknowledge that I have been informed and understand all the instructions given to me and received a copy. I do not have any more questions at this time, but understand that I may call the Specialty Clinic at [facility] at [phone] during business hours should I have any further questions or need assistance in obtaining follow-up care.    __________________________________________  _____________  __________ Signature of Patient or Authorized Representative            Date                   Time    __________________________________________ Nurse's Signature

## 2012-01-11 NOTE — Progress Notes (Signed)
[name], MD [address] [place] [address]  1. Pancytopenia     CURRENT THERAPY: Observation  INTERVAL HISTORY: Bryce Hill 51 y.o. male returns for  regular  visit for followup of pancytopenia, unknown etiology, but stable.   Bryce Hill is here to review his lab work and CT scan.  His lab work reveals pancytopenia which is stable.  He has not been infected or  Required antibiotics.  His other lab work is unimpressive.  His iron stores are adequate.  I personally reviewed and went over laboratory results with the patient.  I personally reviewed and went over radiographic studies with the patient.  CT of Abd/pelvis did not show any liver disease or splenomegaly.    So we reviewed this information and I printed out copies of his lab work and CT report to go home with.  He is accompanied by an assistant from the rest home.   Complete ROS questioning is negative.  Past Medical History  Diagnosis Date  . Thrombocytopenia   . Hypertension   . Hypothyroidism   . Atrial fibrillation   . CVA (cerebral vascular accident)   . Pancytopenia 01/11/2012    Stable    has Pancytopenia on his problem list.      has no known allergies.  Bryce Hill does not currently have medications on file.  No past surgical history on file.  Denies any headaches, dizziness, double vision, fevers, chills, night sweats, nausea, vomiting, diarrhea, constipation, chest pain, heart palpitations, shortness of breath, blood in stool, black tarry stool, urinary pain, urinary burning, urinary frequency, hematuria.   PHYSICAL EXAMINATION  ECOG PERFORMANCE STATUS: 1 - Symptomatic but completely ambulatory  Filed Vitals:   01/11/12 1500  BP: 104/78  Pulse: 62  Temp: 97.3 F (36.3 C)  Resp: 16    GENERAL:alert, no distress, well nourished, well developed, comfortable, cooperative and smiling, slow mentation. SKIN: skin color, texture, turgor are normal, no rashes or significant  lesions HEAD: Normocephalic, No masses, lesions, tenderness or abnormalities EYES: normal, Conjunctiva are pink and non-injected EARS: External ears normal OROPHARYNX:lips, buccal mucosa, and tongue normal, mucous membranes are moist and poor dentition  NECK: supple, no adenopathy, thyroid normal size, non-tender, without nodularity, no stridor, non-tender, trachea midline LYMPH:  no palpable lymphadenopathy BREAST:not examined LUNGS: clear to auscultation and percussion HEART: regular rate & rhythm, no murmurs, no gallops, S1 normal and S2 normal ABDOMEN:abdomen soft, non-tender and normal bowel sounds BACK: Back symmetric, no curvature., No CVA tenderness EXTREMITIES:less then 2 second capillary refill, no joint deformities, effusion, or inflammation, no edema, no skin discoloration, no clubbing, no cyanosis  NEURO: alert & oriented x 3 with fluent speech, no focal motor/sensory deficits, gait normal   LABORATORY DATA: CBC    Component Value Date/Time   WBC 2.1* 12/14/2011 1517   WBC 2.8* 08/19/2011 1514   RBC 3.87* 12/14/2011 1517   RBC 3.99* 08/19/2011 1514   HGB 12.3* 12/14/2011 1517   HGB 13.2 08/19/2011 1514   HCT 35.9* 12/14/2011 1517   HCT 38.9 08/19/2011 1514   PLT 61* 12/14/2011 1517   PLT 71* 08/19/2011 1514   MCV 92.8 12/14/2011 1517   MCV 97.6 08/19/2011 1514   MCH 31.8 12/14/2011 1517   MCH 33.1 08/19/2011 1514   MCHC 34.3 12/14/2011 1517   MCHC 33.9 08/19/2011 1514   RDW 14.4 12/14/2011 1517   RDW 14.5 08/19/2011 1514   LYMPHSABS 0.8 12/14/2011 1517   LYMPHSABS 0.9 08/19/2011 1514  MONOABS 0.3 12/14/2011 1517   MONOABS 0.4 08/19/2011 1514   EOSABS 0.1 12/14/2011 1517   EOSABS 0.1 08/19/2011 1514   BASOSABS 0.0 12/14/2011 1517   BASOSABS 0.0 08/19/2011 1514      Chemistry      Component Value Date/Time   NA 135 12/14/2011 1517   K 3.9 12/14/2011 1517   CL 101 12/14/2011 1517   CO2 26 12/14/2011 1517   BUN 13 12/14/2011 1517   CREATININE 0.95 12/14/2011 1517      Component Value Date/Time    CALCIUM 8.9 12/14/2011 1517   ALKPHOS 41 12/14/2011 1517   AST 19 12/14/2011 1517   ALT 12 12/14/2011 1517   BILITOT 1.4* 12/14/2011 1517     Lab Results  Component Value Date   IRON 82 12/14/2011   TIBC 225 12/14/2011   FERRITIN 360* 12/14/2011     RADIOGRAPHIC STUDIES:  12/24/2011  *RADIOLOGY REPORT*  Clinical Data: Thrombocytopenia. Evaluate spleen.  CT ABDOMEN WITHOUT CONTRAST  Technique: Multidetector CT imaging of the abdomen was performed  following the standard protocol without IV contrast.  Comparison: Ultrasound abdomen 10/04/2008.  Findings: The lung bases are clear. No pulmonary nodules or  pleural effusions.  The liver is unremarkable without contrast. No focal lesions or  biliary dilatation to the gallbladder is normal. No common bile  duct dilatation. The pancreas appears normal. The adrenal glands  and kidneys are unremarkable.  The spleen is normal in size measuring 9.4 x 7.9 x 6.9 cm. Splenic  volume is 250 ml.  The stomach, duodenum, visualized small bowel and visualized colon  are unremarkable. No mesenteric or retroperitoneal mass or  adenopathy. The aorta is normal in caliber.  The bony structures are unremarkable. Small lucency in the L1  vertebral body is likely a benign hemangioma.  IMPRESSION:  1. Unremarkable noncontrast CT examination of the abdomen.  2. The spleen is normal in size.  Original Report Authenticated By: P. Loralie Champagne, M.D.    PATHOLOGY:  11/25/2008  BONE MARROW REPORT  Case #: [id number] Patient Name: DISHAWN, BHARGAVA Office Chart Number: N/A  MRN: [id number] Pathologist: Beulah Gandy. Luisa Hart, MD DOB/Age 12-14-1960 (Age: 10) Gender: M Date Taken: 11/25/2008 Date Received: 11/25/2008  FINAL DIAGNOSIS  MICROSCOPIC EXAMINATION AND DIAGNOSIS  BONE MARROW, LEFT ILIAC CREST BIOPSY AND ASPIRATE: - TRILINEAGE HEMATOPOIESIS WITH ADEQUATE MEGAKARYOCYTES.  PERIPHERAL BLOOD: - THROMBOCYTOPENIA AND NEUTROPENIA.  mw Date Reported:  11/26/2008 Beulah Gandy. Luisa Hart, MD  Electronically Signed Out By JDP      ASSESSMENT:  1. Thrombocytopenia and leukopenia  2. Obesity  3. Hypertension  4. 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 I personally reviewed and went over laboratory results with the patient. 2. I personally reviewed and went over radiographic studies with the patient. 3. Labs today: CBC diff, CMET, B12, folate, MMA, Sed Rate, ANA, RF 4. No abnormality of liver enzymes so will no perform Hepatitis panel at this time 5. Return in 3 months for follow-up.   All questions were answered. The patient knows to call the clinic with any problems, questions or concerns. We can certainly see the patient much sooner if necessary.  The patient and plan discussed with Glenford Peers, MD and he is in agreement with the aforementioned.  Aleila Syverson

## 2012-01-11 NOTE — Progress Notes (Signed)
Bryce Hill presented for labwork. Labs per MD order drawn via Peripheral Line 23 gauge needle inserted in right antecubital.  Good blood return present. Procedure without incident.  Needle removed intact. Patient tolerated procedure well.

## 2012-01-12 LAB — ANA: Anti Nuclear Antibody(ANA): NEGATIVE

## 2012-01-12 LAB — RHEUMATOID FACTOR: Rhuematoid fact SerPl-aCnc: 25 IU/mL — ABNORMAL HIGH (ref <=14)

## 2012-01-12 LAB — VITAMIN B12: Vitamin B-12: 463 pg/mL (ref 211–911)

## 2012-01-13 DIAGNOSIS — I119 Hypertensive heart disease without heart failure: Secondary | ICD-10-CM | POA: Diagnosis not present

## 2012-01-13 DIAGNOSIS — I4891 Unspecified atrial fibrillation: Secondary | ICD-10-CM | POA: Diagnosis not present

## 2012-02-08 DIAGNOSIS — E039 Hypothyroidism, unspecified: Secondary | ICD-10-CM | POA: Diagnosis not present

## 2012-02-08 DIAGNOSIS — I119 Hypertensive heart disease without heart failure: Secondary | ICD-10-CM | POA: Diagnosis not present

## 2012-02-08 DIAGNOSIS — I669 Occlusion and stenosis of unspecified cerebral artery: Secondary | ICD-10-CM | POA: Diagnosis not present

## 2012-02-08 DIAGNOSIS — I4891 Unspecified atrial fibrillation: Secondary | ICD-10-CM | POA: Diagnosis not present

## 2012-02-09 DIAGNOSIS — I4891 Unspecified atrial fibrillation: Secondary | ICD-10-CM | POA: Diagnosis not present

## 2012-02-18 ENCOUNTER — Other Ambulatory Visit: Payer: Self-pay | Admitting: Oncology

## 2012-02-18 ENCOUNTER — Other Ambulatory Visit (HOSPITAL_BASED_OUTPATIENT_CLINIC_OR_DEPARTMENT_OTHER): Payer: Medicare Other

## 2012-02-18 ENCOUNTER — Ambulatory Visit (HOSPITAL_BASED_OUTPATIENT_CLINIC_OR_DEPARTMENT_OTHER): Payer: Medicare Other | Admitting: Oncology

## 2012-02-18 VITALS — BP 106/67 | HR 73 | Temp 97.5°F | Resp 20 | Ht 69.0 in | Wt 227.0 lb

## 2012-02-18 DIAGNOSIS — D696 Thrombocytopenia, unspecified: Secondary | ICD-10-CM

## 2012-02-18 DIAGNOSIS — D61818 Other pancytopenia: Secondary | ICD-10-CM

## 2012-02-18 DIAGNOSIS — D72819 Decreased white blood cell count, unspecified: Secondary | ICD-10-CM

## 2012-02-18 LAB — CBC WITH DIFFERENTIAL/PLATELET
Basophils Absolute: 0 10*3/uL (ref 0.0–0.1)
Eosinophils Absolute: 0.1 10*3/uL (ref 0.0–0.5)
HCT: 38.7 % (ref 38.4–49.9)
HGB: 13.1 g/dL (ref 13.0–17.1)
MCV: 96.4 fL (ref 79.3–98.0)
NEUT#: 0.9 10*3/uL — ABNORMAL LOW (ref 1.5–6.5)
RDW: 15.1 % — ABNORMAL HIGH (ref 11.0–14.6)
lymph#: 0.8 10*3/uL — ABNORMAL LOW (ref 0.9–3.3)

## 2012-02-18 NOTE — Progress Notes (Signed)
Hematology and Oncology Follow Up Visit  Bryce Hill [id number] [birth year] 51 y.o. 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  Prior Therapy:  Status post bone marrow biopsy done in August 2010, did not show any infiltrative bone marrow involvement.  Current therapy: Observation and surveillance.  Interim History:  Bryce Hill presents today for a followup visit, a very pleasant gentleman, continued to reside in a rest home and has not really had any recent illnesses or hospitalization.  He has continued to perform activities of daily living without any hindrance or decline.  He had not had any hospitalization, had not had any illnesses, had not any constitutional symptoms. No other bleeding episods noted.   Medications: I have reviewed the patient's current medications. Current outpatient prescriptions:acetaminophen (TYLENOL) 325 MG tablet, Take 650 mg by mouth every 6 (six) hours as needed., Disp: , Rfl: ;  amLODipine (NORVASC) 5 MG tablet, Take 5 mg by mouth daily.  , Disp: , Rfl: ;  carvedilol (COREG) 12.5 MG tablet, Take 12.5 mg by mouth 2 (two) times daily with a meal.  , Disp: , Rfl: ;  levothyroxine (SYNTHROID, LEVOTHROID) 100 MCG tablet, Take 100 mcg by mouth daily.  , Disp: , Rfl:  potassium chloride SA (K-DUR,KLOR-CON) 20 MEQ tablet, Take 20 mEq by mouth 2 (two) times daily.  , Disp: , Rfl: ;  Psyllium (QC FIBER LAXATIVE PO), Take 625 mg by mouth daily. 2 tabs , Disp: , Rfl: ;  warfarin (COUMADIN) 5 MG tablet, Take 5 mg by mouth daily. 5 mg daily except 7.5 mg on Monday and Friday, Disp: , Rfl:   Allergies: No Known Allergies  Past Medical History, Surgical history, Social history, and Family History were reviewed and updated.  Review of Systems: Constitutional:  Negative for fever, chills, night sweats, anorexia, weight loss,  pain. Cardiovascular: no chest pain or dyspnea on exertion Respiratory: negative Neurological: negative Dermatological: negative ENT: negative Skin: Negative. Gastrointestinal: no abdominal pain, change in bowel habits, or black or bloody stools Genito-Urinary: negative Hematological and Lymphatic: negative Breast: negative Musculoskeletal: negative Remaining ROS negative. Physical Exam: Blood pressure 106/67, pulse 73, temperature 97.5 F (36.4 C), temperature source Oral, resp. rate 20, height 5\' 9"  (1.753 m), weight 227 lb (102.967 kg). ECOG: 1 General appearance: alert Head: Normocephalic, without obvious abnormality, atraumatic Neck: no adenopathy, no carotid bruit, no JVD, supple, symmetrical, trachea midline and thyroid not enlarged, symmetric, no tenderness/mass/nodules Lymph nodes: Cervical, supraclavicular, and axillary nodes normal. Heart:regular rate and rhythm, S1, S2 normal, no murmur, click, rub or gallop Lung:chest clear, no wheezing, rales, normal symmetric air entry Abdomin: soft, non-tender, without masses or organomegaly EXT:no erythema, induration, or nodules   Lab Results: Lab Results  Component Value Date   WBC 2.3* 02/18/2012   HGB 13.1 02/18/2012   HCT 38.7 02/18/2012   MCV 96.4 02/18/2012   PLT 64* 02/18/2012     Chemistry      Component Value Date/Time   NA 135 01/11/2012 1530   K 3.8 01/11/2012 1530   CL 102 01/11/2012 1530   CO2 26 01/11/2012 1530   BUN 16 01/11/2012 1530   CREATININE 0.94 01/11/2012 1530      Component Value Date/Time   CALCIUM 9.0 01/11/2012 1530   ALKPHOS 42 01/11/2012 1530   AST 19 01/11/2012 1530   ALT 10 01/11/2012 1530   BILITOT 1.4* 01/11/2012 1530  Impression and Plan:  A 51 year old gentleman with the: 1. Thrombocytopenia and mild leukopenia.  Differential diagnosis includes myelodysplasia versus autoimmune disorder or possibly also sequestration due to hypersplenism (less likely) . For the time being, his counts  have been adequate for the last 3 years, no need for any intervention at this time.  He does not really require any transfusion support at this time. Patient was seen recently at [facility] and has a follow up there in 12/23. I will not schedule him any follow up at this time. I will be happy to see him in the future as needed.   Ella Guillotte, MD 11/1/20134:39 PM

## 2012-03-10 DIAGNOSIS — E039 Hypothyroidism, unspecified: Secondary | ICD-10-CM | POA: Diagnosis not present

## 2012-03-10 DIAGNOSIS — I4891 Unspecified atrial fibrillation: Secondary | ICD-10-CM | POA: Diagnosis not present

## 2012-03-10 DIAGNOSIS — I119 Hypertensive heart disease without heart failure: Secondary | ICD-10-CM | POA: Diagnosis not present

## 2012-03-10 DIAGNOSIS — I669 Occlusion and stenosis of unspecified cerebral artery: Secondary | ICD-10-CM | POA: Diagnosis not present

## 2012-03-21 DIAGNOSIS — I119 Hypertensive heart disease without heart failure: Secondary | ICD-10-CM | POA: Diagnosis not present

## 2012-03-21 DIAGNOSIS — E782 Mixed hyperlipidemia: Secondary | ICD-10-CM | POA: Diagnosis not present

## 2012-03-21 DIAGNOSIS — I2510 Atherosclerotic heart disease of native coronary artery without angina pectoris: Secondary | ICD-10-CM | POA: Diagnosis not present

## 2012-03-21 DIAGNOSIS — E039 Hypothyroidism, unspecified: Secondary | ICD-10-CM | POA: Diagnosis not present

## 2012-03-21 DIAGNOSIS — I4891 Unspecified atrial fibrillation: Secondary | ICD-10-CM | POA: Diagnosis not present

## 2012-03-21 DIAGNOSIS — I669 Occlusion and stenosis of unspecified cerebral artery: Secondary | ICD-10-CM | POA: Diagnosis not present

## 2012-03-29 DIAGNOSIS — E039 Hypothyroidism, unspecified: Secondary | ICD-10-CM | POA: Diagnosis not present

## 2012-03-29 DIAGNOSIS — I10 Essential (primary) hypertension: Secondary | ICD-10-CM | POA: Diagnosis not present

## 2012-03-29 DIAGNOSIS — I619 Nontraumatic intracerebral hemorrhage, unspecified: Secondary | ICD-10-CM | POA: Diagnosis not present

## 2012-03-29 DIAGNOSIS — D696 Thrombocytopenia, unspecified: Secondary | ICD-10-CM | POA: Diagnosis not present

## 2012-03-29 DIAGNOSIS — I4891 Unspecified atrial fibrillation: Secondary | ICD-10-CM | POA: Diagnosis not present

## 2012-03-30 DIAGNOSIS — I6789 Other cerebrovascular disease: Secondary | ICD-10-CM | POA: Diagnosis not present

## 2012-03-30 DIAGNOSIS — I10 Essential (primary) hypertension: Secondary | ICD-10-CM | POA: Diagnosis not present

## 2012-03-30 DIAGNOSIS — E039 Hypothyroidism, unspecified: Secondary | ICD-10-CM | POA: Diagnosis not present

## 2012-04-10 ENCOUNTER — Ambulatory Visit (HOSPITAL_COMMUNITY): Payer: Medicare Other | Admitting: Oncology

## 2012-04-13 DIAGNOSIS — I4891 Unspecified atrial fibrillation: Secondary | ICD-10-CM | POA: Diagnosis not present

## 2012-04-14 ENCOUNTER — Ambulatory Visit (HOSPITAL_COMMUNITY): Payer: Medicare Other | Admitting: Oncology

## 2012-04-27 ENCOUNTER — Ambulatory Visit (HOSPITAL_COMMUNITY): Payer: Medicare Other | Admitting: Oncology

## 2012-05-03 ENCOUNTER — Encounter (HOSPITAL_COMMUNITY): Payer: Medicare Other | Attending: Oncology | Admitting: Oncology

## 2012-05-03 ENCOUNTER — Encounter (HOSPITAL_COMMUNITY): Payer: Self-pay | Admitting: Oncology

## 2012-05-03 VITALS — BP 102/69 | HR 76 | Temp 97.4°F | Resp 16 | Wt 234.0 lb

## 2012-05-03 DIAGNOSIS — D61818 Other pancytopenia: Secondary | ICD-10-CM | POA: Diagnosis not present

## 2012-05-03 NOTE — Patient Instructions (Addendum)
[facility] Discharge Instructions  RECOMMENDATIONS MADE BY THE CONSULTANT AND ANY TEST RESULTS WILL BE SENT TO YOUR REFERRING PHYSICIAN.  Return to clinic in 6 months for lab work and then to see MD. Report any issues/concerns to clinic as needed prior to appointments.  Thank you for choosing [facility] to provide your oncology and hematology care.  To afford each patient quality time with our providers, please arrive at least 15 minutes before your scheduled appointment time.  With your help, our goal is to use those 15 minutes to complete the necessary work-up to ensure our physicians have the information they need to help with your evaluation and healthcare recommendations.    Effective January 1st, 2014, we ask that you re-schedule your appointment with our physicians should you arrive 10 or more minutes late for your appointment.  We strive to give you quality time with our providers, and arriving late affects you and other patients whose appointments are after yours.    Again, thank you for choosing [facility].  Our hope is that these requests will decrease the amount of time that you wait before being seen by our physicians.       _____________________________________________________________  Should you have questions after your visit to [facility], please contact our office at [phone] between the hours of 8:30 a.m. and 5:00 p.m.  Voicemails left after 4:30 p.m. will not be returned until the following business day.  For prescription refill requests, have your pharmacy contact our office with your prescription refill request.

## 2012-05-03 NOTE — Progress Notes (Signed)
Bryce John, MD [address] [place] [address]  1. Pancytopenia  CBC with Differential, Comprehensive metabolic panel    CURRENT THERAPY: Observation  INTERVAL HISTORY: Bryce Hill 52 y.o. male returns for  regular  visit for followup of pancytopenia.   On chart review, the patient had a leukopenia and thrombocytopenia in 2009.  He started having anemia in 2010.    He was seen by Dr. 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  His labs are very stable and not changing.   He reports that last summer he had a cold, but it did not require antibiotics.  No other infections.  He denies any bleeding, blood in stool, black tarry stool, hematuria, and gingival bleeding.    Hematologically he is doing well.  He denies any complaints.  Hematologically he denies any complaints.    Past Medical History  Diagnosis Date  . Thrombocytopenia   . Hypertension   . Hypothyroidism   . Atrial fibrillation   . CVA (cerebral vascular accident)   . Pancytopenia 01/11/2012    Stable    has Pancytopenia on his problem list.      has no known allergies.  Bryce Hill does not currently have medications on file.  History reviewed. No pertinent past surgical history.  Denies any headaches, dizziness, double vision, fevers, chills, night sweats, nausea, vomiting, diarrhea, constipation, chest pain, heart palpitations, shortness of breath, blood in stool, black tarry stool, urinary pain, urinary burning, urinary frequency, hematuria.   PHYSICAL EXAMINATION  ECOG PERFORMANCE STATUS: 1 - Symptomatic but completely ambulatory  Filed Vitals:   05/03/12 1300  BP: 102/69  Pulse: 76  Temp: 97.4 F (36.3 C)  Resp: 16    GENERAL:alert, no distress, well nourished, well  developed, comfortable, cooperative, smiling and mentally slow SKIN: skin color, texture, turgor are normal, no rashes or significant lesions HEAD: Normocephalic, No masses, lesions, tenderness or abnormalities EYES: normal, Conjunctiva are pink and non-injected EARS: External ears normal OROPHARYNX:mucous membranes are moist  NECK: supple, no adenopathy, thyroid normal size, non-tender, without nodularity, no stridor, non-tender, trachea midline LYMPH:  no palpable lymphadenopathy, no hepatosplenomegaly BREAST:not examined LUNGS: clear to auscultation and percussion HEART: regular rate & rhythm, no murmurs, no gallops, S1 normal and S2 normal ABDOMEN:abdomen soft, non-tender, obese, normal bowel sounds, no masses or organomegaly and no hepatosplenomegaly BACK: Back symmetric, no curvature., No CVA tenderness EXTREMITIES:less then 2 second capillary refill, no joint deformities, effusion, or inflammation, no edema, no skin discoloration, no clubbing, no cyanosis  NEURO: alert & oriented x 3 with fluent speech, no focal motor/sensory deficits, gait normal    LABORATORY DATA: CBC    Component Value Date/Time   WBC 2.3* 02/18/2012 1619   WBC 2.2* 01/11/2012 1530   RBC 4.02* 02/18/2012 1619   RBC 4.02* 01/11/2012 1530   HGB 13.1 02/18/2012 1619   HGB 12.9* 01/11/2012 1530   HCT 38.7 02/18/2012 1619   HCT 37.8* 01/11/2012 1530   PLT 64* 02/18/2012 1619   PLT 62* 01/11/2012 1530   MCV 96.4 02/18/2012 1619   MCV 94.0 01/11/2012 1530   MCH 32.7 02/18/2012 1619   MCH 32.1 01/11/2012 1530   MCHC 33.9 02/18/2012 1619   MCHC 34.1 01/11/2012 1530   RDW 15.1* 02/18/2012 1619  RDW 14.6 01/11/2012 1530   LYMPHSABS 0.8* 02/18/2012 1619   LYMPHSABS 0.9 01/11/2012 1530   MONOABS 0.4 02/18/2012 1619   MONOABS 0.4 01/11/2012 1530   EOSABS 0.1 02/18/2012 1619   EOSABS 0.2 01/11/2012 1530   BASOSABS 0.0 02/18/2012 1619   BASOSABS 0.0 01/11/2012 1530      Chemistry      Component Value Date/Time   NA 135  01/11/2012 1530   K 3.8 01/11/2012 1530   CL 102 01/11/2012 1530   CO2 26 01/11/2012 1530   BUN 16 01/11/2012 1530   CREATININE 0.94 01/11/2012 1530      Component Value Date/Time   CALCIUM 9.0 01/11/2012 1530   ALKPHOS 42 01/11/2012 1530   AST 19 01/11/2012 1530   ALT 10 01/11/2012 1530   BILITOT 1.4* 01/11/2012 1530       RADIOGRAPHIC STUDIES:  12/24/2011  *RADIOLOGY REPORT*  Clinical Data: Thrombocytopenia. Evaluate spleen.  CT ABDOMEN WITHOUT CONTRAST  Technique: Multidetector CT imaging of the abdomen was performed  following the standard protocol without IV contrast.  Comparison: Ultrasound abdomen 10/04/2008.  Findings: The lung bases are clear. No pulmonary nodules or  pleural effusions.  The liver is unremarkable without contrast. No focal lesions or  biliary dilatation to the gallbladder is normal. No common bile  duct dilatation. The pancreas appears normal. The adrenal glands  and kidneys are unremarkable.  The spleen is normal in size measuring 9.4 x 7.9 x 6.9 cm. Splenic  volume is 250 ml.  The stomach, duodenum, visualized small bowel and visualized colon  are unremarkable. No mesenteric or retroperitoneal mass or  adenopathy. The aorta is normal in caliber.  The bony structures are unremarkable. Small lucency in the L1  vertebral body is likely a benign hemangioma.  IMPRESSION:  1. Unremarkable noncontrast CT examination of the abdomen.  2. The spleen is normal in size.  Original Report Authenticated By: P. Loralie Champagne, M.D.    ASSESSMENT:  1. Thrombocytopenia and leukopenia, with occasions of anemia, negative bone marrow aspiration and biopsy in 2010 by Dr. Clelia Hill.  2. Obesity  3. Hypertension  4. 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 I personally reviewed and went over laboratory results with the patient. 2. I personally reviewed and went over radiographic studies with the  patient. 3. Labs in 6 months: CBC diff, CMET 4. Return in 6 months for follow-up   All questions were answered. The patient knows to call the clinic with any problems, questions or concerns. We can certainly see the patient much sooner if necessary.  The patient and plan discussed with Glenford Peers, MD and he is in agreement with the aforementioned.  Joshiah Traynham

## 2012-05-04 DIAGNOSIS — E039 Hypothyroidism, unspecified: Secondary | ICD-10-CM | POA: Diagnosis not present

## 2012-05-04 DIAGNOSIS — I4891 Unspecified atrial fibrillation: Secondary | ICD-10-CM | POA: Diagnosis not present

## 2012-05-04 DIAGNOSIS — I119 Hypertensive heart disease without heart failure: Secondary | ICD-10-CM | POA: Diagnosis not present

## 2012-05-04 DIAGNOSIS — I669 Occlusion and stenosis of unspecified cerebral artery: Secondary | ICD-10-CM | POA: Diagnosis not present

## 2012-06-09 DIAGNOSIS — I498 Other specified cardiac arrhythmias: Secondary | ICD-10-CM | POA: Diagnosis not present

## 2012-06-14 DIAGNOSIS — E039 Hypothyroidism, unspecified: Secondary | ICD-10-CM | POA: Diagnosis not present

## 2012-06-14 DIAGNOSIS — I119 Hypertensive heart disease without heart failure: Secondary | ICD-10-CM | POA: Diagnosis not present

## 2012-06-19 DIAGNOSIS — E039 Hypothyroidism, unspecified: Secondary | ICD-10-CM | POA: Diagnosis not present

## 2012-06-19 DIAGNOSIS — I69923 Fluency disorder following unspecified cerebrovascular disease: Secondary | ICD-10-CM | POA: Diagnosis not present

## 2012-06-19 DIAGNOSIS — Z79899 Other long term (current) drug therapy: Secondary | ICD-10-CM | POA: Diagnosis not present

## 2012-07-13 DIAGNOSIS — I4891 Unspecified atrial fibrillation: Secondary | ICD-10-CM | POA: Diagnosis not present

## 2012-08-14 DIAGNOSIS — I119 Hypertensive heart disease without heart failure: Secondary | ICD-10-CM | POA: Diagnosis not present

## 2012-08-14 DIAGNOSIS — I4891 Unspecified atrial fibrillation: Secondary | ICD-10-CM | POA: Diagnosis not present

## 2012-08-14 DIAGNOSIS — I669 Occlusion and stenosis of unspecified cerebral artery: Secondary | ICD-10-CM | POA: Diagnosis not present

## 2012-08-14 DIAGNOSIS — E039 Hypothyroidism, unspecified: Secondary | ICD-10-CM | POA: Diagnosis not present

## 2012-09-12 DIAGNOSIS — E039 Hypothyroidism, unspecified: Secondary | ICD-10-CM | POA: Diagnosis not present

## 2012-09-12 DIAGNOSIS — I119 Hypertensive heart disease without heart failure: Secondary | ICD-10-CM | POA: Diagnosis not present

## 2012-09-12 DIAGNOSIS — I4891 Unspecified atrial fibrillation: Secondary | ICD-10-CM | POA: Diagnosis not present

## 2012-09-12 DIAGNOSIS — I669 Occlusion and stenosis of unspecified cerebral artery: Secondary | ICD-10-CM | POA: Diagnosis not present

## 2012-09-25 DIAGNOSIS — I10 Essential (primary) hypertension: Secondary | ICD-10-CM | POA: Diagnosis not present

## 2012-09-25 DIAGNOSIS — I4891 Unspecified atrial fibrillation: Secondary | ICD-10-CM | POA: Diagnosis not present

## 2012-09-25 DIAGNOSIS — I6990 Unspecified sequelae of unspecified cerebrovascular disease: Secondary | ICD-10-CM | POA: Diagnosis not present

## 2012-09-25 DIAGNOSIS — Z79899 Other long term (current) drug therapy: Secondary | ICD-10-CM | POA: Diagnosis not present

## 2012-09-25 DIAGNOSIS — E039 Hypothyroidism, unspecified: Secondary | ICD-10-CM | POA: Diagnosis not present

## 2012-09-25 DIAGNOSIS — D696 Thrombocytopenia, unspecified: Secondary | ICD-10-CM | POA: Diagnosis not present

## 2012-09-27 DIAGNOSIS — I4891 Unspecified atrial fibrillation: Secondary | ICD-10-CM | POA: Diagnosis not present

## 2012-09-28 DIAGNOSIS — I499 Cardiac arrhythmia, unspecified: Secondary | ICD-10-CM | POA: Diagnosis not present

## 2012-09-28 DIAGNOSIS — I10 Essential (primary) hypertension: Secondary | ICD-10-CM | POA: Diagnosis not present

## 2012-09-28 DIAGNOSIS — I6789 Other cerebrovascular disease: Secondary | ICD-10-CM | POA: Diagnosis not present

## 2012-10-10 DIAGNOSIS — I119 Hypertensive heart disease without heart failure: Secondary | ICD-10-CM | POA: Diagnosis not present

## 2012-10-10 DIAGNOSIS — I669 Occlusion and stenosis of unspecified cerebral artery: Secondary | ICD-10-CM | POA: Diagnosis not present

## 2012-10-10 DIAGNOSIS — Z125 Encounter for screening for malignant neoplasm of prostate: Secondary | ICD-10-CM | POA: Diagnosis not present

## 2012-10-10 DIAGNOSIS — I4891 Unspecified atrial fibrillation: Secondary | ICD-10-CM | POA: Diagnosis not present

## 2012-10-10 DIAGNOSIS — E119 Type 2 diabetes mellitus without complications: Secondary | ICD-10-CM | POA: Diagnosis not present

## 2012-10-10 DIAGNOSIS — E782 Mixed hyperlipidemia: Secondary | ICD-10-CM | POA: Diagnosis not present

## 2012-10-10 DIAGNOSIS — E039 Hypothyroidism, unspecified: Secondary | ICD-10-CM | POA: Diagnosis not present

## 2012-10-31 ENCOUNTER — Encounter (HOSPITAL_COMMUNITY): Payer: Medicare Other | Attending: Hematology and Oncology

## 2012-10-31 DIAGNOSIS — D61818 Other pancytopenia: Secondary | ICD-10-CM

## 2012-10-31 LAB — CBC WITH DIFFERENTIAL/PLATELET
Basophils Absolute: 0 10*3/uL (ref 0.0–0.1)
Basophils Relative: 0 % (ref 0–1)
Eosinophils Absolute: 0.2 10*3/uL (ref 0.0–0.7)
MCH: 32.3 pg (ref 26.0–34.0)
MCHC: 33.5 g/dL (ref 30.0–36.0)
Monocytes Relative: 15 % — ABNORMAL HIGH (ref 3–12)
Neutrophils Relative %: 48 % (ref 43–77)
Platelets: 52 10*3/uL — ABNORMAL LOW (ref 150–400)
RDW: 15.1 % (ref 11.5–15.5)
Smear Review: DECREASED

## 2012-10-31 LAB — COMPREHENSIVE METABOLIC PANEL
ALT: 21 U/L (ref 0–53)
Albumin: 3.6 g/dL (ref 3.5–5.2)
Calcium: 8.7 mg/dL (ref 8.4–10.5)
GFR calc Af Amer: >90 mL/min (ref >90)
Glucose, Bld: 90 mg/dL (ref 70–99)
Sodium: 137 mEq/L (ref 135–145)
Total Protein: 8 g/dL (ref 6.0–8.3)

## 2012-10-31 NOTE — Progress Notes (Signed)
[name] reason for visit today are for labs as scheduled per MD orders.  Venipuncture performed with a 23 gauge butterfly needle to R Antecubital.  Bryce Hill tolerated venipuncture well and without incident; questions were answered and patient was discharged.

## 2012-11-02 ENCOUNTER — Encounter (HOSPITAL_BASED_OUTPATIENT_CLINIC_OR_DEPARTMENT_OTHER): Payer: Medicare Other | Admitting: Oncology

## 2012-11-02 VITALS — BP 124/76 | HR 91 | Temp 97.6°F | Resp 20 | Wt 233.4 lb

## 2012-11-02 DIAGNOSIS — D61818 Other pancytopenia: Secondary | ICD-10-CM | POA: Diagnosis not present

## 2012-11-02 NOTE — Progress Notes (Signed)
[name], MD [address] [place] [address]  Pancytopenia - Plan: CBC with Differential, Comprehensive metabolic panel, CBC with Differential, Comprehensive metabolic panel  CURRENT THERAPY: Observation   INTERVAL HISTORY: Bryce Hill 52 y.o. male returns for  regular  visit for followup of pancytopenia.   On chart review, the patient had a leukopenia and thrombocytopenia in 2009. He started having anemia in 2010.   He was seen by Dr. 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  I personally reviewed and went over laboratory results with the patient.  The patient will have remained very stable. His white blood cell test 2.3, hemoglobin 11.5 g/dL, platelet count 60,454, ANC is 1.1.  He reports that his left ear is bothering him and he actually has a cotton ball in place. He denies any pain or ear discharge.  He denies any fevers, chills, or night sweats.    Hematologically, the patient is stable and ROS questioning is negative. He denies any bleeding, blood in stool, black tarry stool, hematuria, easy bruisability, and rashes.  His appetite is strong, and his weight is very stable.  He denies any antibiotic requirements over the past 6 months. He denies any hospitalizations. He denies any recurrent, or frequent infections    Past Medical History  Diagnosis Date  . Thrombocytopenia   . Hypertension   . Hypothyroidism   . Atrial fibrillation   . CVA (cerebral vascular accident)   . Pancytopenia 01/11/2012    Stable    has Pancytopenia on his problem list.     has No Known Allergies.  Mr. Cartlidge does not currently have medications on file.  No past surgical history on file.  Denies any headaches, dizziness, double vision, fevers,  chills, night sweats, nausea, vomiting, diarrhea, constipation, chest pain, heart palpitations, shortness of breath, blood in stool, black tarry stool, urinary pain, urinary burning, urinary frequency, hematuria.   PHYSICAL EXAMINATION  ECOG PERFORMANCE STATUS: 0 - Asymptomatic  Filed Vitals:   11/02/12 1342  BP: 124/76  Pulse: 91  Temp: 97.6 F (36.4 C)  Resp: 20    GENERAL:alert, no distress, well nourished, well developed, comfortable, cooperative, obese and smiling SKIN: skin color, texture, turgor are normal, no rashes or significant lesions HEAD: Normocephalic, No masses, lesions, tenderness or abnormalities EYES: normal, PERRLA, EOMI, Conjunctiva are pink and non-injected EARS: R TM normal, shiny and non-erythematous and with minimal cerumen, L TM  Obstructed secondary to cerumen, will defer to ENT OROPHARYNX:no exudate, no erythema and mucous membranes are moist  NECK: supple, no adenopathy, thyroid normal size, non-tender, without nodularity, no stridor, non-tender, trachea midline LYMPH:  no palpable lymphadenopathy, no hepatosplenomegaly BREAST:not examined LUNGS: clear to auscultation and percussion HEART: regular rate & rhythm, no murmurs, no gallops, S1 normal and S2 normal ABDOMEN:abdomen soft, non-tender, obese, normal bowel sounds, no masses or organomegaly and no hepatosplenomegaly BACK: Back symmetric, no curvature., No CVA tenderness EXTREMITIES:less then 2 second capillary refill, no joint deformities, effusion, or inflammation, no skin discoloration, positive findings:  edema B/L with hyperpigmentation changes, will defer to attending physician.  NEURO: alert & oriented x 3 with fluent speech    LABORATORY DATA: CBC    Component Value Date/Time   WBC 2.3* 10/31/2012 1156   WBC  2.3* 02/18/2012 1619   RBC 3.56* 10/31/2012 1156   RBC 4.02* 02/18/2012 1619   HGB 11.5* 10/31/2012 1156   HGB 13.1 02/18/2012 1619   HCT 34.3* 10/31/2012 1156   HCT 38.7 02/18/2012  1619   PLT 52* 10/31/2012 1156   PLT 64* 02/18/2012 1619   MCV 96.3 10/31/2012 1156   MCV 96.4 02/18/2012 1619   MCH 32.3 10/31/2012 1156   MCH 32.7 02/18/2012 1619   MCHC 33.5 10/31/2012 1156   MCHC 33.9 02/18/2012 1619   RDW 15.1 10/31/2012 1156   RDW 15.1* 02/18/2012 1619   LYMPHSABS 0.7 10/31/2012 1156   LYMPHSABS 0.8* 02/18/2012 1619   MONOABS 0.4 10/31/2012 1156   MONOABS 0.4 02/18/2012 1619   EOSABS 0.2 10/31/2012 1156   EOSABS 0.1 02/18/2012 1619   BASOSABS 0.0 10/31/2012 1156   BASOSABS 0.0 02/18/2012 1619      Chemistry      Component Value Date/Time   NA 137 10/31/2012 1156   K 4.2 10/31/2012 1156   CL 105 10/31/2012 1156   CO2 26 10/31/2012 1156   BUN 14 10/31/2012 1156   CREATININE 0.81 10/31/2012 1156      Component Value Date/Time   CALCIUM 8.7 10/31/2012 1156   ALKPHOS 44 10/31/2012 1156   AST 26 10/31/2012 1156   ALT 21 10/31/2012 1156   BILITOT 1.3* 10/31/2012 1156       ASSESSMENT:  1. Thrombocytopenia and leukopenia, with occasions of anemia, negative bone marrow aspiration and biopsy in 2010 by Dr. Clelia Croft.  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 Obesity  3. Hypertension  4. 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 I personally reviewed and went over laboratory results with the patient. 2. Labs in 6 months: CBC diff, CMET 3. Recommend follow-up with ENT as directed 4. Continue follow-up with PCP 5. Return in 6 months for follow-up   THERAPY PLAN:  Labs are stable and we'll repeat laboratory work in 6 months. We'll see him back in 6 months for followup.  All questions were answered. The patient knows to call the clinic with any problems, questions or concerns. We can certainly see the patient much sooner if necessary.  Patient and plan discussed with Dr. Erline Hau and he is in agreement with the aforementioned.   [name]

## 2012-11-02 NOTE — Patient Instructions (Addendum)
[facility] Discharge Instructions  RECOMMENDATIONS MADE BY THE CONSULTANT AND ANY TEST RESULTS WILL BE SENT TO YOUR REFERRING PHYSICIAN.  EXAM FINDINGS BY THE PHYSICIAN TODAY AND SIGNS OR SYMPTOMS TO REPORT TO CLINIC OR PRIMARY PHYSICIAN:   Return in 6 months to see the doctor and for labs  Labs look great from our standpoint.    Thank you for choosing [facility] to provide your oncology and hematology care.  To afford each patient quality time with our providers, please arrive at least 15 minutes before your scheduled appointment time.  With your help, our goal is to use those 15 minutes to complete the necessary work-up to ensure our physicians have the information they need to help with your evaluation and healthcare recommendations.    Effective January 1st, 2014, we ask that you re-schedule your appointment with our physicians should you arrive 10 or more minutes late for your appointment.  We strive to give you quality time with our providers, and arriving late affects you and other patients whose appointments are after yours.    Again, thank you for choosing [facility].  Our hope is that these requests will decrease the amount of time that you wait before being seen by our physicians.       _____________________________________________________________  Should you have questions after your visit to [facility] [place], please contact our office at [phone] between the hours of 8:30 a.m. and 5:00 p.m.  Voicemails left after 4:30 p.m. will not be returned until the following business day.  For prescription refill requests, have your pharmacy contact our office with your prescription refill request.

## 2012-11-07 DIAGNOSIS — I4891 Unspecified atrial fibrillation: Secondary | ICD-10-CM | POA: Diagnosis not present

## 2012-11-07 DIAGNOSIS — I669 Occlusion and stenosis of unspecified cerebral artery: Secondary | ICD-10-CM | POA: Diagnosis not present

## 2012-11-07 DIAGNOSIS — I119 Hypertensive heart disease without heart failure: Secondary | ICD-10-CM | POA: Diagnosis not present

## 2012-11-07 DIAGNOSIS — E039 Hypothyroidism, unspecified: Secondary | ICD-10-CM | POA: Diagnosis not present

## 2012-12-08 DIAGNOSIS — I119 Hypertensive heart disease without heart failure: Secondary | ICD-10-CM | POA: Diagnosis not present

## 2012-12-08 DIAGNOSIS — I4891 Unspecified atrial fibrillation: Secondary | ICD-10-CM | POA: Diagnosis not present

## 2012-12-08 DIAGNOSIS — E039 Hypothyroidism, unspecified: Secondary | ICD-10-CM | POA: Diagnosis not present

## 2012-12-08 DIAGNOSIS — I669 Occlusion and stenosis of unspecified cerebral artery: Secondary | ICD-10-CM | POA: Diagnosis not present

## 2012-12-15 DIAGNOSIS — J410 Simple chronic bronchitis: Secondary | ICD-10-CM | POA: Diagnosis not present

## 2012-12-21 DIAGNOSIS — E039 Hypothyroidism, unspecified: Secondary | ICD-10-CM | POA: Diagnosis not present

## 2012-12-21 DIAGNOSIS — Z79899 Other long term (current) drug therapy: Secondary | ICD-10-CM | POA: Diagnosis not present

## 2012-12-21 DIAGNOSIS — I4891 Unspecified atrial fibrillation: Secondary | ICD-10-CM | POA: Diagnosis not present

## 2012-12-21 DIAGNOSIS — I10 Essential (primary) hypertension: Secondary | ICD-10-CM | POA: Diagnosis not present

## 2012-12-21 DIAGNOSIS — D696 Thrombocytopenia, unspecified: Secondary | ICD-10-CM | POA: Diagnosis not present

## 2012-12-21 DIAGNOSIS — I69923 Fluency disorder following unspecified cerebrovascular disease: Secondary | ICD-10-CM | POA: Diagnosis not present

## 2012-12-26 DIAGNOSIS — I4891 Unspecified atrial fibrillation: Secondary | ICD-10-CM | POA: Diagnosis not present

## 2012-12-28 DIAGNOSIS — E039 Hypothyroidism, unspecified: Secondary | ICD-10-CM | POA: Diagnosis not present

## 2012-12-28 DIAGNOSIS — I669 Occlusion and stenosis of unspecified cerebral artery: Secondary | ICD-10-CM | POA: Diagnosis not present

## 2012-12-28 DIAGNOSIS — I119 Hypertensive heart disease without heart failure: Secondary | ICD-10-CM | POA: Diagnosis not present

## 2012-12-28 DIAGNOSIS — I4891 Unspecified atrial fibrillation: Secondary | ICD-10-CM | POA: Diagnosis not present

## 2012-12-29 DIAGNOSIS — J309 Allergic rhinitis, unspecified: Secondary | ICD-10-CM | POA: Diagnosis not present

## 2012-12-29 DIAGNOSIS — J410 Simple chronic bronchitis: Secondary | ICD-10-CM | POA: Diagnosis not present

## 2013-01-12 ENCOUNTER — Other Ambulatory Visit (HOSPITAL_COMMUNITY): Payer: Self-pay | Admitting: Internal Medicine

## 2013-01-12 ENCOUNTER — Ambulatory Visit (HOSPITAL_COMMUNITY)
Admission: RE | Admit: 2013-01-12 | Discharge: 2013-01-12 | Disposition: A | Discharge status: Home / Self Care | Payer: Medicare Other | Source: Ambulatory Visit | Attending: Internal Medicine | Admitting: Internal Medicine

## 2013-01-12 DIAGNOSIS — J40 Bronchitis, not specified as acute or chronic: Secondary | ICD-10-CM | POA: Diagnosis not present

## 2013-01-12 DIAGNOSIS — J9819 Other pulmonary collapse: Secondary | ICD-10-CM | POA: Diagnosis not present

## 2013-01-25 DIAGNOSIS — I4891 Unspecified atrial fibrillation: Secondary | ICD-10-CM | POA: Diagnosis not present

## 2013-01-25 DIAGNOSIS — I669 Occlusion and stenosis of unspecified cerebral artery: Secondary | ICD-10-CM | POA: Diagnosis not present

## 2013-01-25 DIAGNOSIS — E039 Hypothyroidism, unspecified: Secondary | ICD-10-CM | POA: Diagnosis not present

## 2013-01-25 DIAGNOSIS — I119 Hypertensive heart disease without heart failure: Secondary | ICD-10-CM | POA: Diagnosis not present

## 2013-01-29 DIAGNOSIS — I4891 Unspecified atrial fibrillation: Secondary | ICD-10-CM | POA: Diagnosis not present

## 2013-02-20 DIAGNOSIS — H6120 Impacted cerumen, unspecified ear: Secondary | ICD-10-CM | POA: Diagnosis not present

## 2013-02-20 DIAGNOSIS — I10 Essential (primary) hypertension: Secondary | ICD-10-CM | POA: Diagnosis not present

## 2013-02-27 DIAGNOSIS — I4891 Unspecified atrial fibrillation: Secondary | ICD-10-CM | POA: Diagnosis not present

## 2013-03-06 DIAGNOSIS — E039 Hypothyroidism, unspecified: Secondary | ICD-10-CM | POA: Diagnosis not present

## 2013-03-06 DIAGNOSIS — I10 Essential (primary) hypertension: Secondary | ICD-10-CM | POA: Diagnosis not present

## 2013-03-28 DIAGNOSIS — Z79899 Other long term (current) drug therapy: Secondary | ICD-10-CM | POA: Diagnosis not present

## 2013-03-28 DIAGNOSIS — D699 Hemorrhagic condition, unspecified: Secondary | ICD-10-CM | POA: Diagnosis not present

## 2013-03-28 DIAGNOSIS — E039 Hypothyroidism, unspecified: Secondary | ICD-10-CM | POA: Diagnosis not present

## 2013-03-28 DIAGNOSIS — I4891 Unspecified atrial fibrillation: Secondary | ICD-10-CM | POA: Diagnosis not present

## 2013-04-02 DIAGNOSIS — I4891 Unspecified atrial fibrillation: Secondary | ICD-10-CM | POA: Diagnosis not present

## 2013-04-03 DIAGNOSIS — E039 Hypothyroidism, unspecified: Secondary | ICD-10-CM | POA: Diagnosis not present

## 2013-04-03 DIAGNOSIS — I10 Essential (primary) hypertension: Secondary | ICD-10-CM | POA: Diagnosis not present

## 2013-04-03 DIAGNOSIS — I4891 Unspecified atrial fibrillation: Secondary | ICD-10-CM | POA: Diagnosis not present

## 2013-04-10 DIAGNOSIS — I669 Occlusion and stenosis of unspecified cerebral artery: Secondary | ICD-10-CM | POA: Diagnosis not present

## 2013-04-10 DIAGNOSIS — I4891 Unspecified atrial fibrillation: Secondary | ICD-10-CM | POA: Diagnosis not present

## 2013-04-10 DIAGNOSIS — I119 Hypertensive heart disease without heart failure: Secondary | ICD-10-CM | POA: Diagnosis not present

## 2013-05-04 ENCOUNTER — Ambulatory Visit (HOSPITAL_COMMUNITY): Payer: Medicare Other

## 2013-05-04 ENCOUNTER — Encounter (HOSPITAL_COMMUNITY): Payer: Medicare Other | Attending: Hematology and Oncology

## 2013-05-04 DIAGNOSIS — D61818 Other pancytopenia: Secondary | ICD-10-CM

## 2013-05-04 LAB — CBC WITH DIFFERENTIAL/PLATELET
Basophils Absolute: 0 10*3/uL (ref 0.0–0.1)
Basophils Relative: 1 % (ref 0–1)
EOS ABS: 0.3 10*3/uL (ref 0.0–0.7)
Eosinophils Relative: 8 % — ABNORMAL HIGH (ref 0–5)
HEMATOCRIT: 37.2 % — AB (ref 39.0–52.0)
HEMOGLOBIN: 12.7 g/dL — AB (ref 13.0–17.0)
Lymphocytes Relative: 28 % (ref 12–46)
Lymphs Abs: 1 10*3/uL (ref 0.7–4.0)
MCH: 33.5 pg (ref 26.0–34.0)
MCHC: 34.1 g/dL (ref 30.0–36.0)
MCV: 98.2 fL (ref 78.0–100.0)
MONO ABS: 0.6 10*3/uL (ref 0.1–1.0)
MONOS PCT: 16 % — AB (ref 3–12)
NEUTROS PCT: 48 % (ref 43–77)
Neutro Abs: 1.6 10*3/uL — ABNORMAL LOW (ref 1.7–7.7)
Platelets: 60 10*3/uL — ABNORMAL LOW (ref 150–400)
RBC: 3.79 MIL/uL — ABNORMAL LOW (ref 4.22–5.81)
RDW: 15 % (ref 11.5–15.5)
WBC: 3.4 10*3/uL — ABNORMAL LOW (ref 4.0–10.5)

## 2013-05-04 LAB — COMPREHENSIVE METABOLIC PANEL
ALBUMIN: 3.6 g/dL (ref 3.5–5.2)
ALT: 15 U/L (ref 0–53)
AST: 21 U/L (ref 0–37)
Alkaline Phosphatase: 52 U/L (ref 39–117)
BUN: 14 mg/dL (ref 6–23)
CO2: 27 mEq/L (ref 19–32)
CREATININE: 0.84 mg/dL (ref 0.50–1.35)
Calcium: 8.9 mg/dL (ref 8.4–10.5)
Chloride: 106 mEq/L (ref 96–112)
GFR calc Af Amer: >90 mL/min (ref >90)
GFR calc non Af Amer: >90 mL/min (ref >90)
Glucose, Bld: 96 mg/dL (ref 70–99)
Potassium: 4.2 mEq/L (ref 3.7–5.3)
Sodium: 143 mEq/L (ref 137–147)
Total Bilirubin: 1.4 mg/dL — ABNORMAL HIGH (ref 0.3–1.2)
Total Protein: 8.5 g/dL — ABNORMAL HIGH (ref 6.0–8.3)

## 2013-05-04 LAB — LACTATE DEHYDROGENASE: LDH: 206 U/L (ref 94–250)

## 2013-05-04 NOTE — Progress Notes (Signed)
Labs drawn today for cbc/diff,cmp,ldh,ana

## 2013-05-05 NOTE — Progress Notes (Signed)
This encounter was created in error - please disregard.

## 2013-05-07 LAB — ANA: ANA: NEGATIVE

## 2013-05-07 NOTE — Progress Notes (Signed)
-   No show, letter sent- Bryce Hill   

## 2013-05-08 ENCOUNTER — Ambulatory Visit (HOSPITAL_COMMUNITY): Payer: Medicare Other | Admitting: Oncology

## 2013-05-10 DIAGNOSIS — I4891 Unspecified atrial fibrillation: Secondary | ICD-10-CM | POA: Diagnosis not present

## 2013-05-10 DIAGNOSIS — I669 Occlusion and stenosis of unspecified cerebral artery: Secondary | ICD-10-CM | POA: Diagnosis not present

## 2013-05-25 DIAGNOSIS — I4891 Unspecified atrial fibrillation: Secondary | ICD-10-CM | POA: Diagnosis not present

## 2013-06-08 ENCOUNTER — Encounter (HOSPITAL_COMMUNITY): Payer: Self-pay | Admitting: Oncology

## 2013-06-27 NOTE — Progress Notes (Signed)
Chiquita Loth, MD [address] [place] [address]  Pancytopenia - Plan: loratadine (CLARITIN) 10 MG tablet, CBC with Differential, Comprehensive metabolic panel, Cardiolipin antibodies, IgG, IgM, IgA, Platelet antibodies pnl(drct+indrct), Multiple myeloma panel, serum, Kappa/lambda light chains  CURRENT THERAPY:Observation  INTERVAL HISTORY: Bryce Hill 53 y.o. male returns for  regular  visit for followup of pancytopenia.   On chart review, the patient had a leukopenia and thrombocytopenia in 2009. He started having anemia in 2010.   He was seen by Dr. 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   He does not have splenomegaly on most recent CT scan but in years past he did have some splenomegaly in 2010/2011.  I personally reviewed and went over laboratory results with the patient.  The results are noted within this dictation.  He denies any complaints today other than shortness of breat at [facility].  He denies a history of tobacco abuse.  He admits to a cough that was non-productive.  It sounds like the cough has resolved.  He denies any fevers or chills.  He denies any B symptoms including fevers, chills, night sweats, unintentional weight loss.   Hematologically, he denies any complaints and ROS questioning is negative.   Past Medical History  Diagnosis Date  . Thrombocytopenia   . Hypertension   . Hypothyroidism   . Atrial fibrillation   . CVA (cerebral vascular accident)   . Pancytopenia 01/11/2012    Stable    has Pancytopenia on his problem list.     has No Known Allergies.  Mr. [name] does not currently have medications on file.  History reviewed. No pertinent past surgical history.  Denies any headaches, dizziness, double vision, fevers, chills, night sweats, nausea, vomiting, diarrhea, constipation, chest pain, heart palpitations, shortness of  breath, blood in stool, black tarry stool, urinary pain, urinary burning, urinary frequency, hematuria.   PHYSICAL EXAMINATION  ECOG PERFORMANCE STATUS: 1 - Symptomatic but completely ambulatory  Filed Vitals:   06/28/13 1206  BP: 127/70  Temp: 97.6 F (36.4 C)  Resp: 20    GENERAL:alert, no distress, well nourished, well developed, comfortable, cooperative and smiling SKIN: skin color, texture, turgor are normal, no rashes or significant lesions HEAD: Normocephalic, No masses, lesions, tenderness or abnormalities EYES: normal, PERRLA, EOMI, Conjunctiva are pink and non-injected EARS: External ears normal OROPHARYNX:mucous membranes are moist  NECK: supple, no adenopathy, thyroid normal size, non-tender, without nodularity, no stridor, non-tender, trachea midline LYMPH:  no palpable lymphadenopathy BREAST:not examined LUNGS: clear to auscultation  HEART: regular rate & rhythm, no murmurs and no gallops ABDOMEN:obese and normal bowel sounds BACK: Back symmetric, no curvature. EXTREMITIES:less then 2 second capillary refill, no joint deformities, effusion, or inflammation, no skin discoloration, no clubbing, no cyanosis  NEURO: alert & oriented x 3 with fluent speech, residual unilateral weakness.   LABORATORY DATA: CBC    Component Value Date/Time   WBC 3.4* 05/04/2013 1105   WBC 2.3* 02/18/2012 1619   RBC 3.79* 05/04/2013 1105   RBC 4.02* 02/18/2012 1619   HGB 12.7* 05/04/2013 1105   HGB 13.1 02/18/2012 1619   HCT 37.2* 05/04/2013 1105   HCT 38.7 02/18/2012 1619   PLT 60* 05/04/2013 1105   PLT 64* 02/18/2012 1619   MCV 98.2 05/04/2013 1105   MCV 96.4 02/18/2012 1619   MCH 33.5 05/04/2013 1105   MCH 32.7 02/18/2012 1619   MCHC  34.1 05/04/2013 1105   MCHC 33.9 02/18/2012 1619   RDW 15.0 05/04/2013 1105   RDW 15.1* 02/18/2012 1619   LYMPHSABS 1.0 05/04/2013 1105   LYMPHSABS 0.8* 02/18/2012 1619   MONOABS 0.6 05/04/2013 1105   MONOABS 0.4 02/18/2012 1619   EOSABS 0.3 05/04/2013 1105    EOSABS 0.1 02/18/2012 1619   BASOSABS 0.0 05/04/2013 1105   BASOSABS 0.0 02/18/2012 1619     ASSESSMENT:  1. Thrombocytopenia and leukopenia, with occasions of anemia, negative bone marrow aspiration and biopsy in 2010 by Dr. Alen Blew. 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  Stable. 2. Obesity  3. Hypertension  4. 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 Shortness of breat at [facility], will defer to PCP, Dr. Legrand Rams.  Patient Active Problem List   Diagnosis Date Noted  . Pancytopenia 01/11/2012     PLAN:  1. I personally reviewed and went over laboratory results with the patient.  The results are noted within this dictation. 2. Labs today: CBC diff, CMET, MM panel 3. Labs in 6 months: CBC diff, CMET, MM panel, platelet antibodies, and cardiolipin antibodies 4. Continue follow-up with PCP 5. Return in 6 months for follow-up   THERAPY PLAN:  Labs are stable and we'll repeat laboratory work in 6 months. We'll see him back in 6 months for followup.  If any clinical or laboratory changes are noted, we would not hesitate to perform a repeat bone marrow aspiration and biopsy.  I have order platelet antibodies to evaluate for chronic ITP and cardiolipin antibodies (he has a history of stroke, unknown whether this was cardiogenic).   All questions were answered. The patient knows to call the clinic with any problems, questions or concerns. We can certainly see the patient much sooner if necessary.  Patient and plan discussed with Dr. Farrel Gobble and he is in agreement with the aforementioned.   Maygen Sirico

## 2013-06-28 ENCOUNTER — Encounter (HOSPITAL_COMMUNITY): Payer: Self-pay | Admitting: Oncology

## 2013-06-28 ENCOUNTER — Encounter (HOSPITAL_COMMUNITY): Payer: Medicare Other | Attending: Hematology and Oncology | Admitting: Oncology

## 2013-06-28 ENCOUNTER — Encounter (HOSPITAL_BASED_OUTPATIENT_CLINIC_OR_DEPARTMENT_OTHER): Payer: Medicare Other

## 2013-06-28 VITALS — BP 127/70 | Temp 97.6°F | Resp 20 | Wt 248.0 lb

## 2013-06-28 DIAGNOSIS — E669 Obesity, unspecified: Secondary | ICD-10-CM

## 2013-06-28 DIAGNOSIS — D61818 Other pancytopenia: Secondary | ICD-10-CM

## 2013-06-28 DIAGNOSIS — I10 Essential (primary) hypertension: Secondary | ICD-10-CM

## 2013-06-28 DIAGNOSIS — I4891 Unspecified atrial fibrillation: Secondary | ICD-10-CM | POA: Diagnosis not present

## 2013-06-28 DIAGNOSIS — I669 Occlusion and stenosis of unspecified cerebral artery: Secondary | ICD-10-CM | POA: Diagnosis not present

## 2013-06-28 LAB — CBC WITH DIFFERENTIAL/PLATELET
BASOS ABS: 0 10*3/uL (ref 0.0–0.1)
BASOS PCT: 0 % (ref 0–1)
EOS ABS: 0.2 10*3/uL (ref 0.0–0.7)
Eosinophils Relative: 7 % — ABNORMAL HIGH (ref 0–5)
HCT: 37.7 % — ABNORMAL LOW (ref 39.0–52.0)
Hemoglobin: 12.8 g/dL — ABNORMAL LOW (ref 13.0–17.0)
Lymphocytes Relative: 29 % (ref 12–46)
Lymphs Abs: 0.7 10*3/uL (ref 0.7–4.0)
MCH: 32.6 pg (ref 26.0–34.0)
MCHC: 34 g/dL (ref 30.0–36.0)
MCV: 95.9 fL (ref 78.0–100.0)
MONOS PCT: 15 % — AB (ref 3–12)
Monocytes Absolute: 0.4 10*3/uL (ref 0.1–1.0)
NEUTROS PCT: 49 % (ref 43–77)
Neutro Abs: 1.2 10*3/uL — ABNORMAL LOW (ref 1.7–7.7)
PLATELETS: 58 10*3/uL — AB (ref 150–400)
RBC: 3.93 MIL/uL — ABNORMAL LOW (ref 4.22–5.81)
RDW: 13.8 % (ref 11.5–15.5)
WBC: 2.4 10*3/uL — ABNORMAL LOW (ref 4.0–10.5)

## 2013-06-28 LAB — COMPREHENSIVE METABOLIC PANEL
ALT: 16 U/L (ref 0–53)
AST: 22 U/L (ref 0–37)
Albumin: 3.5 g/dL (ref 3.5–5.2)
Alkaline Phosphatase: 44 U/L (ref 39–117)
BUN: 13 mg/dL (ref 6–23)
CO2: 27 mEq/L (ref 19–32)
Calcium: 8.7 mg/dL (ref 8.4–10.5)
Chloride: 104 mEq/L (ref 96–112)
Creatinine, Ser: 0.77 mg/dL (ref 0.50–1.35)
GFR calc Af Amer: >90 mL/min (ref >90)
GFR calc non Af Amer: >90 mL/min (ref >90)
Glucose, Bld: 103 mg/dL — ABNORMAL HIGH (ref 70–99)
POTASSIUM: 4.1 meq/L (ref 3.7–5.3)
SODIUM: 139 meq/L (ref 137–147)
TOTAL PROTEIN: 8.4 g/dL — AB (ref 6.0–8.3)
Total Bilirubin: 1.1 mg/dL (ref 0.3–1.2)

## 2013-06-28 NOTE — Progress Notes (Signed)
Labs drawn today for cbc/diff,cmp,kllc,mm

## 2013-06-28 NOTE — Patient Instructions (Signed)
[facility] Discharge Instructions  RECOMMENDATIONS MADE BY THE CONSULTANT AND ANY TEST RESULTS WILL BE SENT TO YOUR REFERRING PHYSICIAN.  EXAM FINDINGS BY THE PHYSICIAN TODAY AND SIGNS OR SYMPTOMS TO REPORT TO CLINIC OR PRIMARY PHYSICIAN: Exam and findings as discussed by Robynn Pane, PA-C.  Report fevers, night sweats, recurring infections, unusual bruising or bleeding or other concerns.   MEDICATIONS PRESCRIBED:  none  INSTRUCTIONS/FOLLOW-UP: Follow-up with lab work and office visit in 6 months.  Thank you for choosing [facility] to provide your oncology and hematology care.  To afford each patient quality time with our providers, please arrive at least 15 minutes before your scheduled appointment time.  With your help, our goal is to use those 15 minutes to complete the necessary work-up to ensure our physicians have the information they need to help with your evaluation and healthcare recommendations.    Effective January 1st, 2014, we ask that you re-schedule your appointment with our physicians should you arrive 10 or more minutes late for your appointment.  We strive to give you quality time with our providers, and arriving late affects you and other patients whose appointments are after yours.    Again, thank you for choosing [facility]  Our hope is that these requests will decrease the amount of time that you wait before being seen by our physicians.       _____________________________________________________________  Should you have questions after your visit to [facility], please contact our office at [phone] between the hours of 8:30 a.m. and 5:00 p.m.  Voicemails left after 4:30 p.m. will not be returned until the following business day.  For prescription refill requests, have your pharmacy contact our office with your prescription refill request.

## 2013-06-29 LAB — KAPPA/LAMBDA LIGHT CHAINS
Kappa free light chain: 3.47 mg/dL — ABNORMAL HIGH (ref 0.33–1.94)
Kappa, lambda light chain ratio: 0.95 (ref 0.26–1.65)
LAMDA FREE LIGHT CHAINS: 3.66 mg/dL — AB (ref 0.57–2.63)

## 2013-07-02 LAB — MULTIPLE MYELOMA PANEL, SERUM
ALBUMIN ELP: 47 % — AB (ref 55.8–66.1)
Alpha-1-Globulin: 3.6 % (ref 2.9–4.9)
Alpha-2-Globulin: 8.6 % (ref 7.1–11.8)
BETA 2: 7.2 % — AB (ref 3.2–6.5)
BETA GLOBULIN: 5.5 % (ref 4.7–7.2)
Gamma Globulin: 28.1 % — ABNORMAL HIGH (ref 11.1–18.8)
IGA: 582 mg/dL — AB (ref 68–379)
IGM, SERUM: 66 mg/dL (ref 41–251)
IgG (Immunoglobin G), Serum: 2190 mg/dL — ABNORMAL HIGH (ref 650–1600)
M-Spike, %: NOT DETECTED g/dL
TOTAL PROTEIN: 7.7 g/dL (ref 6.0–8.3)

## 2013-07-05 DIAGNOSIS — Z79899 Other long term (current) drug therapy: Secondary | ICD-10-CM | POA: Diagnosis not present

## 2013-07-05 DIAGNOSIS — I10 Essential (primary) hypertension: Secondary | ICD-10-CM | POA: Diagnosis not present

## 2013-07-05 DIAGNOSIS — E039 Hypothyroidism, unspecified: Secondary | ICD-10-CM | POA: Diagnosis not present

## 2013-07-05 DIAGNOSIS — I4891 Unspecified atrial fibrillation: Secondary | ICD-10-CM | POA: Diagnosis not present

## 2013-07-05 DIAGNOSIS — D696 Thrombocytopenia, unspecified: Secondary | ICD-10-CM | POA: Diagnosis not present

## 2013-07-05 DIAGNOSIS — I69923 Fluency disorder following unspecified cerebrovascular disease: Secondary | ICD-10-CM | POA: Diagnosis not present

## 2013-07-24 DIAGNOSIS — I6789 Other cerebrovascular disease: Secondary | ICD-10-CM | POA: Diagnosis not present

## 2013-07-24 DIAGNOSIS — I499 Cardiac arrhythmia, unspecified: Secondary | ICD-10-CM | POA: Diagnosis not present

## 2013-07-24 DIAGNOSIS — I10 Essential (primary) hypertension: Secondary | ICD-10-CM | POA: Diagnosis not present

## 2013-07-30 DIAGNOSIS — I4891 Unspecified atrial fibrillation: Secondary | ICD-10-CM | POA: Diagnosis not present

## 2013-07-30 DIAGNOSIS — I669 Occlusion and stenosis of unspecified cerebral artery: Secondary | ICD-10-CM | POA: Diagnosis not present

## 2013-07-31 DIAGNOSIS — I4891 Unspecified atrial fibrillation: Secondary | ICD-10-CM | POA: Diagnosis not present

## 2013-08-30 DIAGNOSIS — I509 Heart failure, unspecified: Secondary | ICD-10-CM | POA: Diagnosis not present

## 2013-08-30 DIAGNOSIS — I119 Hypertensive heart disease without heart failure: Secondary | ICD-10-CM | POA: Diagnosis not present

## 2013-08-30 DIAGNOSIS — I4891 Unspecified atrial fibrillation: Secondary | ICD-10-CM | POA: Diagnosis not present

## 2013-08-30 DIAGNOSIS — J209 Acute bronchitis, unspecified: Secondary | ICD-10-CM | POA: Diagnosis not present

## 2013-08-30 DIAGNOSIS — J309 Allergic rhinitis, unspecified: Secondary | ICD-10-CM | POA: Diagnosis present

## 2013-08-30 DIAGNOSIS — I5020 Unspecified systolic (congestive) heart failure: Secondary | ICD-10-CM | POA: Diagnosis not present

## 2013-08-30 DIAGNOSIS — Z86711 Personal history of pulmonary embolism: Secondary | ICD-10-CM | POA: Diagnosis not present

## 2013-08-30 DIAGNOSIS — R0602 Shortness of breath: Secondary | ICD-10-CM | POA: Diagnosis not present

## 2013-08-30 DIAGNOSIS — I69998 Other sequelae following unspecified cerebrovascular disease: Secondary | ICD-10-CM | POA: Diagnosis not present

## 2013-08-30 DIAGNOSIS — I5031 Acute diastolic (congestive) heart failure: Secondary | ICD-10-CM | POA: Diagnosis not present

## 2013-08-30 DIAGNOSIS — R5381 Other malaise: Secondary | ICD-10-CM | POA: Diagnosis not present

## 2013-08-30 DIAGNOSIS — I69920 Aphasia following unspecified cerebrovascular disease: Secondary | ICD-10-CM | POA: Diagnosis not present

## 2013-08-30 DIAGNOSIS — R0989 Other specified symptoms and signs involving the circulatory and respiratory systems: Secondary | ICD-10-CM | POA: Diagnosis not present

## 2013-08-30 DIAGNOSIS — R5383 Other fatigue: Secondary | ICD-10-CM | POA: Diagnosis present

## 2013-08-30 DIAGNOSIS — J811 Chronic pulmonary edema: Secondary | ICD-10-CM | POA: Diagnosis not present

## 2013-08-30 DIAGNOSIS — I10 Essential (primary) hypertension: Secondary | ICD-10-CM | POA: Diagnosis not present

## 2013-08-30 DIAGNOSIS — D696 Thrombocytopenia, unspecified: Secondary | ICD-10-CM | POA: Diagnosis present

## 2013-08-30 DIAGNOSIS — Z7901 Long term (current) use of anticoagulants: Secondary | ICD-10-CM | POA: Diagnosis not present

## 2013-08-30 DIAGNOSIS — R0609 Other forms of dyspnea: Secondary | ICD-10-CM | POA: Diagnosis not present

## 2013-08-30 DIAGNOSIS — E039 Hypothyroidism, unspecified: Secondary | ICD-10-CM | POA: Diagnosis not present

## 2013-08-30 DIAGNOSIS — E785 Hyperlipidemia, unspecified: Secondary | ICD-10-CM | POA: Diagnosis present

## 2013-09-03 DIAGNOSIS — J069 Acute upper respiratory infection, unspecified: Secondary | ICD-10-CM | POA: Diagnosis not present

## 2013-09-03 DIAGNOSIS — J309 Allergic rhinitis, unspecified: Secondary | ICD-10-CM | POA: Diagnosis not present

## 2013-09-20 DIAGNOSIS — Z79899 Other long term (current) drug therapy: Secondary | ICD-10-CM | POA: Diagnosis not present

## 2013-09-20 DIAGNOSIS — E039 Hypothyroidism, unspecified: Secondary | ICD-10-CM | POA: Diagnosis not present

## 2013-09-20 DIAGNOSIS — D696 Thrombocytopenia, unspecified: Secondary | ICD-10-CM | POA: Diagnosis not present

## 2013-09-20 DIAGNOSIS — I10 Essential (primary) hypertension: Secondary | ICD-10-CM | POA: Diagnosis not present

## 2013-09-20 DIAGNOSIS — I4891 Unspecified atrial fibrillation: Secondary | ICD-10-CM | POA: Diagnosis not present

## 2013-09-20 DIAGNOSIS — I69923 Fluency disorder following unspecified cerebrovascular disease: Secondary | ICD-10-CM | POA: Diagnosis not present

## 2013-10-02 DIAGNOSIS — I119 Hypertensive heart disease without heart failure: Secondary | ICD-10-CM | POA: Diagnosis not present

## 2013-10-02 DIAGNOSIS — I4891 Unspecified atrial fibrillation: Secondary | ICD-10-CM | POA: Diagnosis not present

## 2013-10-02 DIAGNOSIS — I509 Heart failure, unspecified: Secondary | ICD-10-CM | POA: Diagnosis not present

## 2013-10-23 DIAGNOSIS — I10 Essential (primary) hypertension: Secondary | ICD-10-CM | POA: Diagnosis not present

## 2013-10-23 DIAGNOSIS — I6350 Cerebral infarction due to unspecified occlusion or stenosis of unspecified cerebral artery: Secondary | ICD-10-CM | POA: Diagnosis not present

## 2013-10-23 DIAGNOSIS — E7800 Pure hypercholesterolemia, unspecified: Secondary | ICD-10-CM | POA: Diagnosis not present

## 2013-10-23 DIAGNOSIS — I498 Other specified cardiac arrhythmias: Secondary | ICD-10-CM | POA: Diagnosis not present

## 2013-11-01 DIAGNOSIS — I4891 Unspecified atrial fibrillation: Secondary | ICD-10-CM | POA: Diagnosis not present

## 2013-11-27 DIAGNOSIS — I4891 Unspecified atrial fibrillation: Secondary | ICD-10-CM | POA: Diagnosis not present

## 2013-11-30 DIAGNOSIS — I4891 Unspecified atrial fibrillation: Secondary | ICD-10-CM | POA: Diagnosis not present

## 2013-12-24 NOTE — Progress Notes (Signed)
-  No show, letter sent-  KEFALAS,THOMAS 12/27/2013

## 2013-12-26 DIAGNOSIS — I4891 Unspecified atrial fibrillation: Secondary | ICD-10-CM | POA: Diagnosis not present

## 2013-12-27 ENCOUNTER — Ambulatory Visit (HOSPITAL_COMMUNITY): Payer: Medicare Other | Admitting: Oncology

## 2013-12-27 ENCOUNTER — Other Ambulatory Visit (HOSPITAL_COMMUNITY): Payer: Medicare Other

## 2014-01-06 NOTE — Progress Notes (Signed)
[place], MD [place] [address]  Pancytopenia - Plan: CBC with Differential, Comprehensive metabolic panel, Cardiolipin antibodies, IgG, IgM, IgA, Multiple myeloma panel, serum, Kappa/lambda light chains, CANCELED: Platelet antibodies pnl(drct+indrct)  Left-sided low back pain without sciatica - Plan: DG Thoracolumabar Spine  CURRENT THERAPY: Observation  INTERVAL HISTORY: Shuan Statzer 53 y.o. male returns for  regular  visit for followup of pancytopenia  On chart review, the patient had a leukopenia and thrombocytopenia in 2009. He started having anemia in 2010.   He was seen by Dr. 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   He does not have splenomegaly on most recent CT scan but in years past he did have some splenomegaly in 2010/2011.  I personally reviewed and went over laboratory results with the patient.  The results are noted within this dictation.  He denies any B symptoms including fevers, chills, night sweats, and unintentional weight loss.  He notes a low back pain that favors the left side x 2-3 weeks.  He reports that movements increase his back pain, particularly flexion and extension of spine.   Hematologically, he denies any complaints and ROS questioning is negative.  Past Medical History  Diagnosis Date  . Thrombocytopenia   . Hypertension   . Hypothyroidism   . Atrial fibrillation   . CVA (cerebral vascular accident)   . Pancytopenia 01/11/2012    Stable    has Pancytopenia and Left-sided low back pain without sciatica on his problem list.     has No Known Allergies.  Mr. Wainwright does not currently have medications on file.  No past surgical history on file.  Denies any headaches, dizziness, double vision, fevers, chills, night sweats, nausea, vomiting, diarrhea, constipation, chest pain, heart palpitations,  shortness of breath, blood in stool, black tarry stool, urinary pain, urinary burning, urinary frequency, hematuria.   PHYSICAL EXAMINATION  ECOG PERFORMANCE STATUS: 0 - Asymptomatic  Filed Vitals:   01/07/14 0900  BP: 104/56  Pulse: 69  Temp: 98.4 F (36.9 C)  Resp: 18    GENERAL:alert, no distress, well nourished, well developed, comfortable, cooperative and smiling SKIN: skin color, texture, turgor are normal, no rashes or significant lesions HEAD: Normocephalic, No masses, lesions, tenderness or abnormalities EYES: normal, PERRLA, EOMI, Conjunctiva are pink and non-injected EARS: External ears normal OROPHARYNX:mucous membranes are moist  NECK: supple, no adenopathy, thyroid normal size, non-tender, without nodularity, no stridor, non-tender, trachea midline LYMPH:  no palpable lymphadenopathy BREAST:not examined LUNGS: clear to auscultation and percussion HEART: regular rate & rhythm, no murmurs, no gallops, S1 normal and S2 normal ABDOMEN:abdomen soft, non-tender, normal bowel sounds and no masses or organomegaly BACK: left low back muscle tightness compared to right.  ROM WNL. EXTREMITIES:less then 2 second capillary refill, no joint deformities, effusion, or inflammation, no skin discoloration, no clubbing, no cyanosis  NEURO: alert & oriented x 3, gait normal   LABORATORY DATA: CBC    Component Value Date/Time   WBC 2.4* 06/28/2013 1218   WBC 2.3* 02/18/2012 1619   RBC 3.93* 06/28/2013 1218   RBC 4.02* 02/18/2012 1619   HGB 12.8* 06/28/2013 1218   HGB 13.1 02/18/2012 1619   HCT 37.7* 06/28/2013 1218   HCT 38.7 02/18/2012 1619   PLT 58* 06/28/2013 1218   PLT 64* 02/18/2012 1619   MCV 95.9 06/28/2013 1218   MCV 96.4 02/18/2012 1619   MCH 32.6 06/28/2013 1218  MCH 32.7 02/18/2012 1619   MCHC 34.0 06/28/2013 1218   MCHC 33.9 02/18/2012 1619   RDW 13.8 06/28/2013 1218   RDW 15.1* 02/18/2012 1619   LYMPHSABS 0.7 06/28/2013 1218   LYMPHSABS 0.8* 02/18/2012 1619   MONOABS 0.4  06/28/2013 1218   MONOABS 0.4 02/18/2012 1619   EOSABS 0.2 06/28/2013 1218   EOSABS 0.1 02/18/2012 1619   BASOSABS 0.0 06/28/2013 1218   BASOSABS 0.0 02/18/2012 1619      Chemistry      Component Value Date/Time   NA 139 06/28/2013 1218   K 4.1 06/28/2013 1218   CL 104 06/28/2013 1218   CO2 27 06/28/2013 1218   BUN 13 06/28/2013 1218   CREATININE 0.77 06/28/2013 1218      Component Value Date/Time   CALCIUM 8.7 06/28/2013 1218   ALKPHOS 44 06/28/2013 1218   AST 22 06/28/2013 1218   ALT 16 06/28/2013 1218   BILITOT 1.1 06/28/2013 1218     Results for DARIUS, LUNDBERG (MRN [id number]) as of 01/06/2014 12:49  Ref. Range 06/28/2013 12:18 06/28/2013 12:18  Albumin ELP Latest Range: 55.8-66.1 %  47.0 (L)  Alpha-1-Globulin Latest Range: 2.9-4.9 %  3.6  Alpha-2-Globulin Latest Range: 7.1-11.8 %  8.6  Beta Globulin Latest Range: 4.7-7.2 %  5.5  Beta 2 Latest Range: 3.2-6.5 %  7.2 (H)  Gamma Globulin Latest Range: 11.1-18.8 %  28.1 (H)  M-SPIKE, % No range found  NOT DETECTED  SPE Interp. No range found  (NOTE)  Comment No range found  (NOTE)  IgG (Immunoglobin G), Serum Latest Range: 3065628855 mg/dL  2190 (H)  IgA Latest Range: 68-379 mg/dL  582 (H)  IgM, Serum Latest Range: 41-251 mg/dL  66  Kappa free light chain Latest Range: 0.33-1.94 mg/dL 3.47 (H)   Lamda free light chains Latest Range: 0.57-2.63 mg/dL 3.66 (H)   Kappa, lamda light chain ratio Latest Range: 0.26-1.65  0.95    Results for KONRAD, HOAK (MRN [id number]) as of 01/06/2014 12:49  Ref. Range 06/28/2013 12:18  IgG (Immunoglobin G), Serum Latest Range: 3065628855 mg/dL 2190 (H)  IgA Latest Range: 68-379 mg/dL 582 (H)  Alpha-1-Globulin Latest Range: 2.9-4.9 % 3.6  Alpha-2-Globulin Latest Range: 7.1-11.8 % 8.6      ASSESSMENT:  1. Thrombocytopenia and leukopenia, with occasions of anemia, negative bone marrow aspiration and biopsy in 2010 by Dr. Alen Blew. 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 Stable.  2. Obesity  3.  Hypertension  4. 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 Low back pain x 2-3 weeks.  Patient Active Problem List   Diagnosis Date Noted  . Left-sided low back pain without sciatica 01/07/2014  . Pancytopenia 01/11/2012     PLAN:  1. I personally reviewed and went over laboratory results with the patient.  The results are noted within this dictation. 2. Labs today: CBC diff, CMET, MM panel, platelet antibodies, anticardiolipin antibodies. 3. Labs in 6 months: CBC diff, CMET, LDH, MM panel 4. Continue to follow-up with primary care provider 5. Plain film of lumbar spine for new back pain to rule out any findings worrisome for osseous malignancy.  If negative, will defer management to primary care provider since that patient did not provide a medication list from the group home he goes to.   6. Return in 6 months for follow-up   THERAPY PLAN:  Labs are stable and we'll repeat laboratory work in 6 months. We'll  see him back in 6 months for followup. If any clinical or laboratory changes are noted, we would not hesitate to perform a repeat bone marrow aspiration and biopsy. I have order platelet antibodies to evaluate for chronic ITP and cardiolipin antibodies (he has a history of stroke, unknown whether this was cardiogenic).   All questions were answered. The patient knows to call the clinic with any problems, questions or concerns. We can certainly see the patient much sooner if necessary.  Patient and plan discussed with Dr. Farrel Gobble and he is in agreement with the aforementioned.   Adetokunbo Mccadden 01/07/2014

## 2014-01-07 ENCOUNTER — Encounter (HOSPITAL_BASED_OUTPATIENT_CLINIC_OR_DEPARTMENT_OTHER): Payer: Medicare Other

## 2014-01-07 ENCOUNTER — Ambulatory Visit (HOSPITAL_COMMUNITY)
Admission: RE | Admit: 2014-01-07 | Discharge: 2014-01-07 | Disposition: A | Discharge status: Home / Self Care | Payer: Medicare Other | Source: Ambulatory Visit | Attending: Oncology | Admitting: Oncology

## 2014-01-07 ENCOUNTER — Encounter (HOSPITAL_COMMUNITY): Payer: Medicare Other | Attending: Oncology | Admitting: Oncology

## 2014-01-07 VITALS — BP 104/56 | HR 69 | Temp 98.4°F | Resp 18 | Wt 248.5 lb

## 2014-01-07 DIAGNOSIS — I10 Essential (primary) hypertension: Secondary | ICD-10-CM | POA: Diagnosis not present

## 2014-01-07 DIAGNOSIS — D61818 Other pancytopenia: Secondary | ICD-10-CM

## 2014-01-07 DIAGNOSIS — M47817 Spondylosis without myelopathy or radiculopathy, lumbosacral region: Secondary | ICD-10-CM | POA: Insufficient documentation

## 2014-01-07 DIAGNOSIS — E039 Hypothyroidism, unspecified: Secondary | ICD-10-CM | POA: Diagnosis not present

## 2014-01-07 DIAGNOSIS — E669 Obesity, unspecified: Secondary | ICD-10-CM | POA: Insufficient documentation

## 2014-01-07 DIAGNOSIS — M545 Low back pain, unspecified: Secondary | ICD-10-CM | POA: Insufficient documentation

## 2014-01-07 DIAGNOSIS — Z8673 Personal history of transient ischemic attack (TIA), and cerebral infarction without residual deficits: Secondary | ICD-10-CM | POA: Insufficient documentation

## 2014-01-07 LAB — COMPREHENSIVE METABOLIC PANEL
ALT: 17 U/L (ref 0–53)
AST: 24 U/L (ref 0–37)
Albumin: 3.6 g/dL (ref 3.5–5.2)
Alkaline Phosphatase: 43 U/L (ref 39–117)
Anion gap: 11 (ref 5–15)
BILIRUBIN TOTAL: 1.3 mg/dL — AB (ref 0.3–1.2)
BUN: 26 mg/dL — ABNORMAL HIGH (ref 6–23)
CO2: 25 mEq/L (ref 19–32)
CREATININE: 1.28 mg/dL (ref 0.50–1.35)
Calcium: 8.9 mg/dL (ref 8.4–10.5)
Chloride: 102 mEq/L (ref 96–112)
GFR calc Af Amer: 72 mL/min — ABNORMAL LOW (ref >90)
GFR calc non Af Amer: 62 mL/min — ABNORMAL LOW (ref >90)
Glucose, Bld: 100 mg/dL — ABNORMAL HIGH (ref 70–99)
Potassium: 4.8 mEq/L (ref 3.7–5.3)
Sodium: 138 mEq/L (ref 137–147)
Total Protein: 8.3 g/dL (ref 6.0–8.3)

## 2014-01-07 LAB — CBC WITH DIFFERENTIAL/PLATELET
BASOS ABS: 0 10*3/uL (ref 0.0–0.1)
Basophils Relative: 0 % (ref 0–1)
Eosinophils Absolute: 0.3 10*3/uL (ref 0.0–0.7)
Eosinophils Relative: 9 % — ABNORMAL HIGH (ref 0–5)
HCT: 38 % — ABNORMAL LOW (ref 39.0–52.0)
HEMOGLOBIN: 13 g/dL (ref 13.0–17.0)
LYMPHS PCT: 29 % (ref 12–46)
Lymphs Abs: 0.9 10*3/uL (ref 0.7–4.0)
MCH: 33.1 pg (ref 26.0–34.0)
MCHC: 34.2 g/dL (ref 30.0–36.0)
MCV: 96.7 fL (ref 78.0–100.0)
Monocytes Absolute: 0.5 10*3/uL (ref 0.1–1.0)
Monocytes Relative: 15 % — ABNORMAL HIGH (ref 3–12)
Neutro Abs: 1.5 10*3/uL — ABNORMAL LOW (ref 1.7–7.7)
Neutrophils Relative %: 47 % (ref 43–77)
Platelets: 74 10*3/uL — ABNORMAL LOW (ref 150–400)
RBC: 3.93 MIL/uL — AB (ref 4.22–5.81)
RDW: 14.9 % (ref 11.5–15.5)
WBC: 3.3 10*3/uL — ABNORMAL LOW (ref 4.0–10.5)

## 2014-01-07 NOTE — Progress Notes (Signed)
Discharged ambulatory to radiology

## 2014-01-07 NOTE — Patient Instructions (Signed)
[facility] Discharge Instructions  RECOMMENDATIONS MADE BY THE CONSULTANT AND ANY TEST RESULTS WILL BE SENT TO YOUR REFERRING PHYSICIAN.  EXAM FINDINGS BY THE PHYSICIAN TODAY AND SIGNS OR SYMPTOMS TO REPORT TO CLINIC OR PRIMARY PHYSICIAN: You saw Bryce Hill Today  Follow up in 6 months with lab appt.  You will have a x-ray of your lower back today  Thank you for choosing [facility] to provide your oncology and hematology care.  To afford each patient quality time with our providers, please arrive at least 15 minutes before your scheduled appointment time.  With your help, our goal is to use those 15 minutes to complete the necessary work-up to ensure our physicians have the information they need to help with your evaluation and healthcare recommendations.    Effective January 1st, 2014, we ask that you re-schedule your appointment with our physicians should you arrive 10 or more minutes late for your appointment.  We strive to give you quality time with our providers, and arriving late affects you and other patients whose appointments are after yours.    Again, thank you for choosing [facility].  Our hope is that these requests will decrease the amount of time that you wait before being seen by our physicians.       _____________________________________________________________  Should you have questions after your visit to [facility], please contact our office at [phone] between the hours of 8:30 a.m. and 5:00 p.m.  Voicemails left after 4:30 p.m. will not be returned until the following business day.  For prescription refill requests, have your pharmacy contact our office with your prescription refill request.

## 2014-01-07 NOTE — Progress Notes (Signed)
LABS FOR CMP,CBCD,ACARD,PLADM,PLABIG,MM,KLLC

## 2014-01-08 LAB — KAPPA/LAMBDA LIGHT CHAINS
KAPPA FREE LGHT CHN: 4.3 mg/dL — AB (ref 0.33–1.94)
KAPPA, LAMDA LIGHT CHAIN RATIO: 1.26 (ref 0.26–1.65)
Lambda free light chains: 3.4 mg/dL — ABNORMAL HIGH (ref 0.57–2.63)

## 2014-01-08 LAB — CARDIOLIPIN ANTIBODIES, IGG, IGM, IGA
ANTICARDIOLIPIN IGG: 12 GPL U/mL (ref <23)
Anticardiolipin IgA: 8 APL U/mL — ABNORMAL LOW (ref <22)
Anticardiolipin IgM: 2 MPL U/mL — ABNORMAL LOW (ref <11)

## 2014-01-09 LAB — MULTIPLE MYELOMA PANEL, SERUM
ALBUMIN ELP: 50.6 % — AB (ref 55.8–66.1)
ALPHA-1-GLOBULIN: 3.8 % (ref 2.9–4.9)
ALPHA-2-GLOBULIN: 8.7 % (ref 7.1–11.8)
BETA 2: 7.1 % — AB (ref 3.2–6.5)
BETA GLOBULIN: 5.4 % (ref 4.7–7.2)
Gamma Globulin: 24.4 % — ABNORMAL HIGH (ref 11.1–18.8)
IgA: 568 mg/dL — ABNORMAL HIGH (ref 68–379)
IgG (Immunoglobin G), Serum: 1860 mg/dL — ABNORMAL HIGH (ref 650–1600)
IgM, Serum: 66 mg/dL (ref 41–251)
M-Spike, %: NOT DETECTED g/dL
Total Protein: 8.1 g/dL (ref 6.0–8.3)

## 2014-01-17 LAB — PLATELET AB, INDIRECT, IGG

## 2014-01-17 LAB — PLATELET ANTIBODIES, DIRECT

## 2014-01-24 DIAGNOSIS — Z23 Encounter for immunization: Secondary | ICD-10-CM | POA: Diagnosis not present

## 2014-01-24 DIAGNOSIS — I10 Essential (primary) hypertension: Secondary | ICD-10-CM | POA: Diagnosis not present

## 2014-01-24 DIAGNOSIS — E039 Hypothyroidism, unspecified: Secondary | ICD-10-CM | POA: Diagnosis not present

## 2014-01-24 DIAGNOSIS — I480 Paroxysmal atrial fibrillation: Secondary | ICD-10-CM | POA: Diagnosis not present

## 2014-01-25 DIAGNOSIS — I482 Chronic atrial fibrillation: Secondary | ICD-10-CM | POA: Diagnosis not present

## 2014-01-25 DIAGNOSIS — I639 Cerebral infarction, unspecified: Secondary | ICD-10-CM | POA: Diagnosis not present

## 2014-01-25 DIAGNOSIS — I4891 Unspecified atrial fibrillation: Secondary | ICD-10-CM | POA: Diagnosis not present

## 2014-02-11 DIAGNOSIS — E039 Hypothyroidism, unspecified: Secondary | ICD-10-CM | POA: Diagnosis not present

## 2014-02-11 DIAGNOSIS — I10 Essential (primary) hypertension: Secondary | ICD-10-CM | POA: Diagnosis not present

## 2014-03-01 DIAGNOSIS — I4891 Unspecified atrial fibrillation: Secondary | ICD-10-CM | POA: Diagnosis not present

## 2014-03-01 DIAGNOSIS — I482 Chronic atrial fibrillation: Secondary | ICD-10-CM | POA: Diagnosis not present

## 2014-03-22 DIAGNOSIS — I10 Essential (primary) hypertension: Secondary | ICD-10-CM | POA: Diagnosis not present

## 2014-03-22 DIAGNOSIS — I4891 Unspecified atrial fibrillation: Secondary | ICD-10-CM | POA: Diagnosis not present

## 2014-03-22 DIAGNOSIS — Z79899 Other long term (current) drug therapy: Secondary | ICD-10-CM | POA: Diagnosis not present

## 2014-03-22 DIAGNOSIS — Z7901 Long term (current) use of anticoagulants: Secondary | ICD-10-CM | POA: Diagnosis not present

## 2014-03-22 DIAGNOSIS — E039 Hypothyroidism, unspecified: Secondary | ICD-10-CM | POA: Diagnosis not present

## 2014-03-22 DIAGNOSIS — D696 Thrombocytopenia, unspecified: Secondary | ICD-10-CM | POA: Diagnosis not present

## 2014-04-05 DIAGNOSIS — I10 Essential (primary) hypertension: Secondary | ICD-10-CM | POA: Diagnosis not present

## 2014-04-05 DIAGNOSIS — I4891 Unspecified atrial fibrillation: Secondary | ICD-10-CM | POA: Diagnosis not present

## 2014-04-05 DIAGNOSIS — I482 Chronic atrial fibrillation: Secondary | ICD-10-CM | POA: Diagnosis not present

## 2014-04-05 DIAGNOSIS — I639 Cerebral infarction, unspecified: Secondary | ICD-10-CM | POA: Diagnosis not present

## 2014-04-23 DIAGNOSIS — J40 Bronchitis, not specified as acute or chronic: Secondary | ICD-10-CM | POA: Diagnosis not present

## 2014-04-23 DIAGNOSIS — E039 Hypothyroidism, unspecified: Secondary | ICD-10-CM | POA: Diagnosis not present

## 2014-04-23 DIAGNOSIS — I10 Essential (primary) hypertension: Secondary | ICD-10-CM | POA: Diagnosis not present

## 2014-05-09 DIAGNOSIS — I482 Chronic atrial fibrillation: Secondary | ICD-10-CM | POA: Diagnosis not present

## 2014-05-09 DIAGNOSIS — I4891 Unspecified atrial fibrillation: Secondary | ICD-10-CM | POA: Diagnosis not present

## 2014-06-05 DIAGNOSIS — I10 Essential (primary) hypertension: Secondary | ICD-10-CM | POA: Diagnosis not present

## 2014-06-05 DIAGNOSIS — J40 Bronchitis, not specified as acute or chronic: Secondary | ICD-10-CM | POA: Diagnosis not present

## 2014-06-05 DIAGNOSIS — E039 Hypothyroidism, unspecified: Secondary | ICD-10-CM | POA: Diagnosis not present

## 2014-06-14 DIAGNOSIS — I482 Chronic atrial fibrillation: Secondary | ICD-10-CM | POA: Diagnosis not present

## 2014-06-14 DIAGNOSIS — I4891 Unspecified atrial fibrillation: Secondary | ICD-10-CM | POA: Diagnosis not present

## 2014-07-05 DIAGNOSIS — I4891 Unspecified atrial fibrillation: Secondary | ICD-10-CM | POA: Diagnosis not present

## 2014-07-08 ENCOUNTER — Encounter (HOSPITAL_COMMUNITY): Payer: Medicare Other | Attending: Oncology | Admitting: Oncology

## 2014-07-08 ENCOUNTER — Encounter (HOSPITAL_BASED_OUTPATIENT_CLINIC_OR_DEPARTMENT_OTHER): Payer: Medicare Other

## 2014-07-08 VITALS — BP 105/71 | HR 69 | Temp 97.9°F | Resp 20 | Wt 262.4 lb

## 2014-07-08 DIAGNOSIS — D61818 Other pancytopenia: Secondary | ICD-10-CM | POA: Insufficient documentation

## 2014-07-08 DIAGNOSIS — D649 Anemia, unspecified: Secondary | ICD-10-CM

## 2014-07-08 LAB — CBC WITH DIFFERENTIAL/PLATELET
BASOS ABS: 0 10*3/uL (ref 0.0–0.1)
Basophils Relative: 1 % (ref 0–1)
EOS ABS: 0.4 10*3/uL (ref 0.0–0.7)
EOS PCT: 11 % — AB (ref 0–5)
HCT: 37.1 % — ABNORMAL LOW (ref 39.0–52.0)
Hemoglobin: 12.2 g/dL — ABNORMAL LOW (ref 13.0–17.0)
LYMPHS ABS: 0.9 10*3/uL (ref 0.7–4.0)
Lymphocytes Relative: 27 % (ref 12–46)
MCH: 32.5 pg (ref 26.0–34.0)
MCHC: 32.9 g/dL (ref 30.0–36.0)
MCV: 98.9 fL (ref 78.0–100.0)
MONO ABS: 0.4 10*3/uL (ref 0.1–1.0)
Monocytes Relative: 12 % (ref 3–12)
NEUTROS PCT: 49 % (ref 43–77)
Neutro Abs: 1.6 10*3/uL — ABNORMAL LOW (ref 1.7–7.7)
Platelets: 63 10*3/uL — ABNORMAL LOW (ref 150–400)
RBC: 3.75 MIL/uL — ABNORMAL LOW (ref 4.22–5.81)
RDW: 14.8 % (ref 11.5–15.5)
WBC: 3.3 10*3/uL — ABNORMAL LOW (ref 4.0–10.5)

## 2014-07-08 LAB — IRON AND TIBC
Iron: 67 ug/dL (ref 42–165)
Saturation Ratios: 29 % (ref 20–55)
TIBC: 233 ug/dL (ref 215–435)
UIBC: 166 ug/dL (ref 125–400)

## 2014-07-08 LAB — LACTATE DEHYDROGENASE: LDH: 207 U/L (ref 94–250)

## 2014-07-08 LAB — COMPREHENSIVE METABOLIC PANEL
ALT: 22 U/L (ref 0–53)
AST: 24 U/L (ref 0–37)
Albumin: 3.7 g/dL (ref 3.5–5.2)
Alkaline Phosphatase: 37 U/L — ABNORMAL LOW (ref 39–117)
Anion gap: 6 (ref 5–15)
BILIRUBIN TOTAL: 1.2 mg/dL (ref 0.3–1.2)
BUN: 25 mg/dL — AB (ref 6–23)
CHLORIDE: 104 mmol/L (ref 96–112)
CO2: 25 mmol/L (ref 19–32)
CREATININE: 1.09 mg/dL (ref 0.50–1.35)
Calcium: 8.6 mg/dL (ref 8.4–10.5)
GFR calc Af Amer: 87 mL/min — ABNORMAL LOW (ref >90)
GFR calc non Af Amer: 75 mL/min — ABNORMAL LOW (ref >90)
Glucose, Bld: 87 mg/dL (ref 70–99)
Potassium: 4.3 mmol/L (ref 3.5–5.1)
Sodium: 135 mmol/L (ref 135–145)
Total Protein: 7.8 g/dL (ref 6.0–8.3)

## 2014-07-08 LAB — FERRITIN: Ferritin: 504 ng/mL — ABNORMAL HIGH (ref 22–322)

## 2014-07-08 LAB — FOLATE

## 2014-07-08 LAB — VITAMIN B12: VITAMIN B 12: 568 pg/mL (ref 211–911)

## 2014-07-08 NOTE — Assessment & Plan Note (Signed)
Stable x years with more recent labs showing intermittent anemia.  Most recent labs 6 months ago demonstrates a normal Hgb.  Bone marrow aspiration and biopsy was negative on 11/25/2008 without any abnormal cytogentics.  Labs today as ordered, including a MM panel and anemia panel.  Return in 6 months with labs: CBC diff and follow-up appointment.  If bicytopenia or pancytopenia worsens, I would not hesitate to order a bone marrow aspiration and biopsy.

## 2014-07-08 NOTE — Patient Instructions (Signed)
[place] at [facility] Discharge Instructions  RECOMMENDATIONS MADE BY THE CONSULTANT AND ANY TEST RESULTS WILL BE SENT TO YOUR REFERRING PHYSICIAN.  Lab work done today. We will call if there are any abnormal results. Lab work again in 6 months. MD appointment again in 6 months. Return as scheduled.  Thank you for choosing [place] at [facility] to provide your oncology and hematology care.  To afford each patient quality time with our provider, please arrive at least 15 minutes before your scheduled appointment time.    You need to re-schedule your appointment should you arrive 10 or more minutes late.  We strive to give you quality time with our providers, and arriving late affects you and other patients whose appointments are after yours.  Also, if you no show three or more times for appointments you may be dismissed from the clinic at the providers discretion.     Again, thank you for choosing [facility].  Our hope is that these requests will decrease the amount of time that you wait before being seen by our physicians.       _____________________________________________________________  Should you have questions after your visit to [facility], please contact our office at [phone] between the hours of 8:30 a.m. and 4:30 p.m.  Voicemails left after 4:30 p.m. will not be returned until the following business day.  For prescription refill requests, have your pharmacy contact our office.

## 2014-07-08 NOTE — Progress Notes (Signed)
[place], MD [place] [address]  Pancytopenia - Plan: Vitamin B12, Folate, Iron and TIBC, Ferritin  Anemia, unspecified anemia type - Plan: Vitamin B12, Folate, Iron and TIBC, Ferritin  CURRENT THERAPY: Observation  INTERVAL HISTORY: Bryce Hill 54 y.o. male returns for followup of pancytopenia with negative bone marrow aspiration and biopsy on 11/25/2008.  On chart review, the patient had a leukopenia and thrombocytopenia in 2009. He started having anemia in 2010.      I personally reviewed and went over laboratory results with the patient.  The results are noted within this dictation.  He denies any B symptoms.  He feels good.  He denies any hospitalizations or recurrent antibiotic requirements.    Hematologically, he denies any complaints and ROS questioning is negative.   Past Medical History  Diagnosis Date  . Thrombocytopenia   . Hypertension   . Hypothyroidism   . Atrial fibrillation   . CVA (cerebral vascular accident)   . Pancytopenia 01/11/2012    Stable    has Pancytopenia and Left-sided low back pain without sciatica on his problem list.     has No Known Allergies.  Mr. Alldredge does not currently have medications on file.  No past surgical history on file.  Denies any headaches, dizziness, double vision, fevers, chills, night sweats, nausea, vomiting, diarrhea, constipation, chest pain, heart palpitations, shortness of breath, blood in stool, black tarry stool, urinary pain, urinary burning, urinary frequency, hematuria.   PHYSICAL EXAMINATION  ECOG PERFORMANCE STATUS: 0 - Asymptomatic  There were no vitals filed for this visit.  GENERAL:alert, no distress, well nourished, well developed, comfortable, cooperative, obese and smiling SKIN: skin color, texture, turgor are normal, no rashes or significant lesions HEAD: Normocephalic, No masses, lesions, tenderness or abnormalities EYES: normal, PERRLA, EOMI,  Conjunctiva are pink and non-injected EARS: External ears normal OROPHARYNX:lips, buccal mucosa, and tongue normal and mucous membranes are moist  NECK: supple, trachea midline LYMPH:  no palpable lymphadenopathy, no hepatosplenomegaly BREAST:not examined LUNGS: clear to auscultation and percussion HEART: regular rate & rhythm, no murmurs, no gallops, S1 normal and S2 normal ABDOMEN:abdomen soft and normal bowel sounds BACK: Back symmetric, no curvature., No CVA tenderness EXTREMITIES:less then 2 second capillary refill, no joint deformities, effusion, or inflammation, no skin discoloration, no clubbing, no cyanosis  NEURO: no focal motor/sensory deficits, gait normal, mentally handicapped    LABORATORY DATA: CBC    Component Value Date/Time   WBC 3.3* 01/07/2014 1047   WBC 2.3* 02/18/2012 1619   RBC 3.93* 01/07/2014 1047   RBC 4.02* 02/18/2012 1619   HGB 13.0 01/07/2014 1047   HGB 13.1 02/18/2012 1619   HCT 38.0* 01/07/2014 1047   HCT 38.7 02/18/2012 1619   PLT 74* 01/07/2014 1047   PLT 64* 02/18/2012 1619   MCV 96.7 01/07/2014 1047   MCV 96.4 02/18/2012 1619   MCH 33.1 01/07/2014 1047   MCH 32.7 02/18/2012 1619   MCHC 34.2 01/07/2014 1047   MCHC 33.9 02/18/2012 1619   RDW 14.9 01/07/2014 1047   RDW 15.1* 02/18/2012 1619   LYMPHSABS 0.9 01/07/2014 1047   LYMPHSABS 0.8* 02/18/2012 1619   MONOABS 0.5 01/07/2014 1047   MONOABS 0.4 02/18/2012 1619   EOSABS 0.3 01/07/2014 1047   EOSABS 0.1 02/18/2012 1619   BASOSABS 0.0 01/07/2014 1047   BASOSABS 0.0 02/18/2012 1619      Chemistry      Component Value Date/Time   NA 138 01/07/2014 1047  K 4.8 01/07/2014 1047   CL 102 01/07/2014 1047   CO2 25 01/07/2014 1047   BUN 26* 01/07/2014 1047   CREATININE 1.28 01/07/2014 1047      Component Value Date/Time   CALCIUM 8.9 01/07/2014 1047   ALKPHOS 43 01/07/2014 1047   AST 24 01/07/2014 1047   ALT 17 01/07/2014 1047   BILITOT 1.3* 01/07/2014 1047         RADIOGRAPHIC STUDIES:  01/07/2014  CLINICAL DATA: Left lower back pain.  EXAM: LUMBAR SPINE - COMPLETE 4+ VIEW  COMPARISON: [facility] 09[facility]2011 CT  FINDINGS: Mild degenerative facet disease and in the lower lumbar spine. This is most pronounced at the lumbosacral junction. Transitional anatomy at the lumbosacral junction. Slight anterolisthesis of L4 on L5 due to facet disease. No fracture. SI joints are symmetric and unremarkable. Mild symmetric degenerative changes in the hips bilaterally with joint space narrowing and early spurring.  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 No acute findings.   Electronically Signed  By: Rolm Baptise M.D.  On: 01/07/2014 11:53     ASSESSMENT AND PLAN:  Pancytopenia Stable x years with more recent labs showing intermittent anemia.  Most recent labs 6 months ago demonstrates a normal Hgb.  Bone marrow aspiration and biopsy was negative on 11/25/2008 without any abnormal cytogentics.  Labs today as ordered, including a MM panel and anemia panel.  Return in 6 months with labs: CBC diff and follow-up appointment.  If bicytopenia or pancytopenia worsens, I would not hesitate to order a bone marrow aspiration and biopsy.   THERAPY PLAN:  Will continue to monitor counts.  All questions were answered. The patient knows to call the clinic with any problems, questions or concerns. We can certainly see the patient much sooner if necessary.  Patient and plan discussed with Dr. Ancil Linsey and she is in agreement with the aforementioned.   This note is electronically signed by: Robynn Pane 07/08/2014 9:36 AM

## 2014-07-08 NOTE — Progress Notes (Signed)
Labs drawn

## 2014-07-09 LAB — KAPPA/LAMBDA LIGHT CHAINS
KAPPA, LAMDA LIGHT CHAIN RATIO: 1.28 (ref 0.26–1.65)
Kappa free light chain: 38.88 mg/L — ABNORMAL HIGH (ref 3.30–19.40)
LAMDA FREE LIGHT CHAINS: 30.34 mg/L — AB (ref 5.71–26.30)

## 2014-07-09 LAB — MULTIPLE MYELOMA PANEL, SERUM
IGA: 536 mg/dL — AB (ref 90–386)
IgG (Immunoglobin G), Serum: 2037 mg/dL — ABNORMAL HIGH (ref 700–1600)
IgM, Serum: 84 mg/dL (ref 20–172)

## 2014-07-23 ENCOUNTER — Encounter: Payer: Self-pay | Admitting: Oncology

## 2014-07-24 DIAGNOSIS — E039 Hypothyroidism, unspecified: Secondary | ICD-10-CM | POA: Diagnosis not present

## 2014-07-24 DIAGNOSIS — I480 Paroxysmal atrial fibrillation: Secondary | ICD-10-CM | POA: Diagnosis not present

## 2014-07-24 DIAGNOSIS — I10 Essential (primary) hypertension: Secondary | ICD-10-CM | POA: Diagnosis not present

## 2014-07-24 DIAGNOSIS — I6350 Cerebral infarction due to unspecified occlusion or stenosis of unspecified cerebral artery: Secondary | ICD-10-CM | POA: Diagnosis not present

## 2014-08-14 DIAGNOSIS — I482 Chronic atrial fibrillation: Secondary | ICD-10-CM | POA: Diagnosis not present

## 2014-08-15 DIAGNOSIS — L84 Corns and callosities: Secondary | ICD-10-CM | POA: Diagnosis not present

## 2014-08-15 DIAGNOSIS — E039 Hypothyroidism, unspecified: Secondary | ICD-10-CM | POA: Diagnosis not present

## 2014-08-15 DIAGNOSIS — I10 Essential (primary) hypertension: Secondary | ICD-10-CM | POA: Diagnosis not present

## 2014-08-23 DIAGNOSIS — M79672 Pain in left foot: Secondary | ICD-10-CM | POA: Diagnosis not present

## 2014-08-23 DIAGNOSIS — M25571 Pain in right ankle and joints of right foot: Secondary | ICD-10-CM | POA: Diagnosis not present

## 2014-08-23 DIAGNOSIS — M7751 Other enthesopathy of right foot: Secondary | ICD-10-CM | POA: Diagnosis not present

## 2014-08-23 DIAGNOSIS — B079 Viral wart, unspecified: Secondary | ICD-10-CM | POA: Diagnosis not present

## 2014-08-23 DIAGNOSIS — M79671 Pain in right foot: Secondary | ICD-10-CM | POA: Diagnosis not present

## 2014-09-06 DIAGNOSIS — D2372 Other benign neoplasm of skin of left lower limb, including hip: Secondary | ICD-10-CM | POA: Diagnosis not present

## 2014-09-06 DIAGNOSIS — D2371 Other benign neoplasm of skin of right lower limb, including hip: Secondary | ICD-10-CM | POA: Diagnosis not present

## 2014-09-12 DIAGNOSIS — I4891 Unspecified atrial fibrillation: Secondary | ICD-10-CM | POA: Diagnosis not present

## 2014-09-18 DIAGNOSIS — I4891 Unspecified atrial fibrillation: Secondary | ICD-10-CM | POA: Diagnosis not present

## 2014-09-18 DIAGNOSIS — I10 Essential (primary) hypertension: Secondary | ICD-10-CM | POA: Diagnosis not present

## 2014-09-18 DIAGNOSIS — Z79899 Other long term (current) drug therapy: Secondary | ICD-10-CM | POA: Diagnosis not present

## 2014-09-18 DIAGNOSIS — E039 Hypothyroidism, unspecified: Secondary | ICD-10-CM | POA: Diagnosis not present

## 2014-09-18 DIAGNOSIS — Z7901 Long term (current) use of anticoagulants: Secondary | ICD-10-CM | POA: Diagnosis not present

## 2014-09-18 DIAGNOSIS — D696 Thrombocytopenia, unspecified: Secondary | ICD-10-CM | POA: Diagnosis not present

## 2014-10-11 DIAGNOSIS — I482 Chronic atrial fibrillation: Secondary | ICD-10-CM | POA: Diagnosis not present

## 2014-10-11 DIAGNOSIS — I639 Cerebral infarction, unspecified: Secondary | ICD-10-CM | POA: Diagnosis not present

## 2014-10-17 DIAGNOSIS — I480 Paroxysmal atrial fibrillation: Secondary | ICD-10-CM | POA: Diagnosis not present

## 2014-10-17 DIAGNOSIS — E039 Hypothyroidism, unspecified: Secondary | ICD-10-CM | POA: Diagnosis not present

## 2014-10-17 DIAGNOSIS — I10 Essential (primary) hypertension: Secondary | ICD-10-CM | POA: Diagnosis not present

## 2014-10-23 DIAGNOSIS — I10 Essential (primary) hypertension: Secondary | ICD-10-CM | POA: Diagnosis not present

## 2014-10-23 DIAGNOSIS — E039 Hypothyroidism, unspecified: Secondary | ICD-10-CM | POA: Diagnosis not present

## 2014-10-23 DIAGNOSIS — I480 Paroxysmal atrial fibrillation: Secondary | ICD-10-CM | POA: Diagnosis not present

## 2014-11-05 IMAGING — CR DG LUMBAR SPINE COMPLETE 4+V
5 series · 5 of 5 positions shown · non-contrast
Comparison: [facility] [DATE] CT

CLINICAL DATA: Left lower back pain.

EXAM:
LUMBAR SPINE - COMPLETE 4+ VIEW

[view not recorded (1 of 5)]
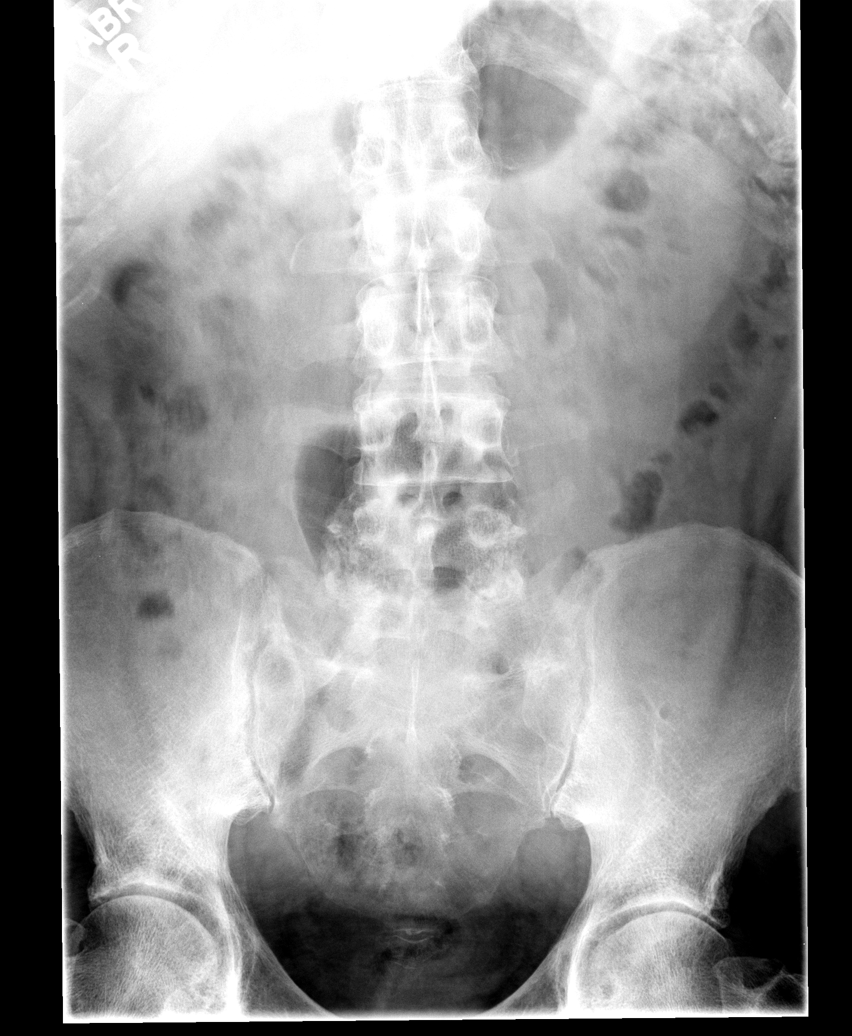

[view not recorded (2 of 5)]
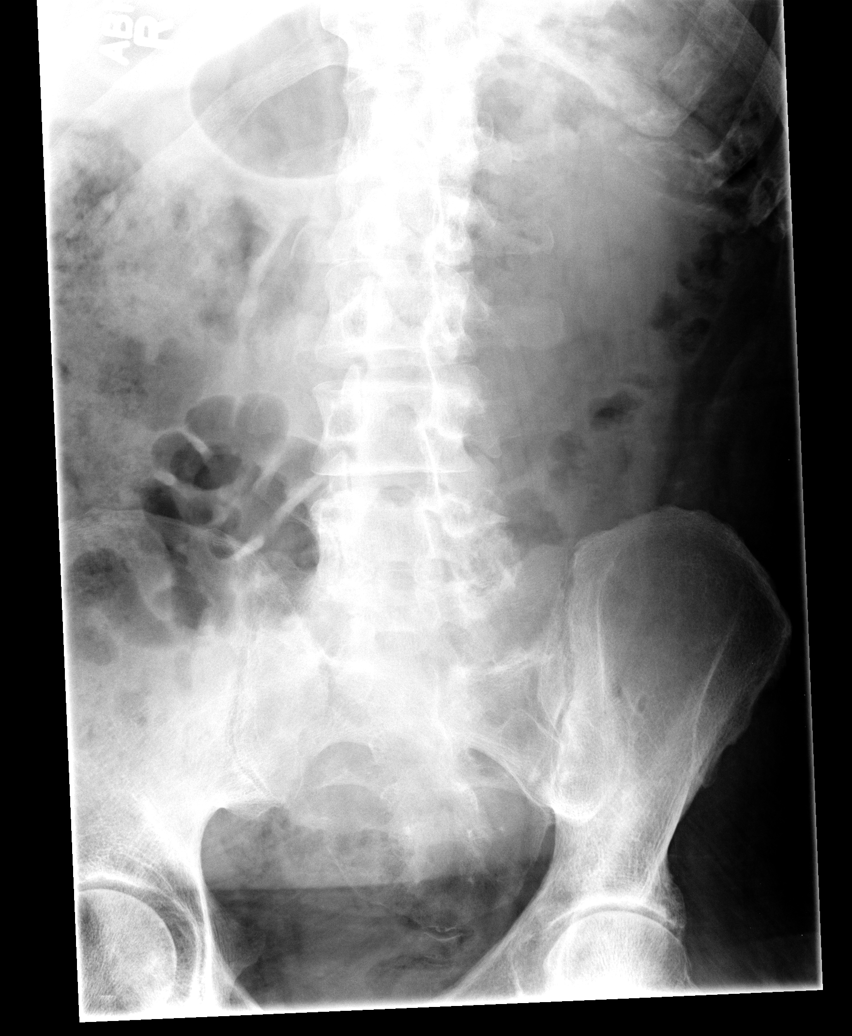

[view not recorded (3 of 5)]
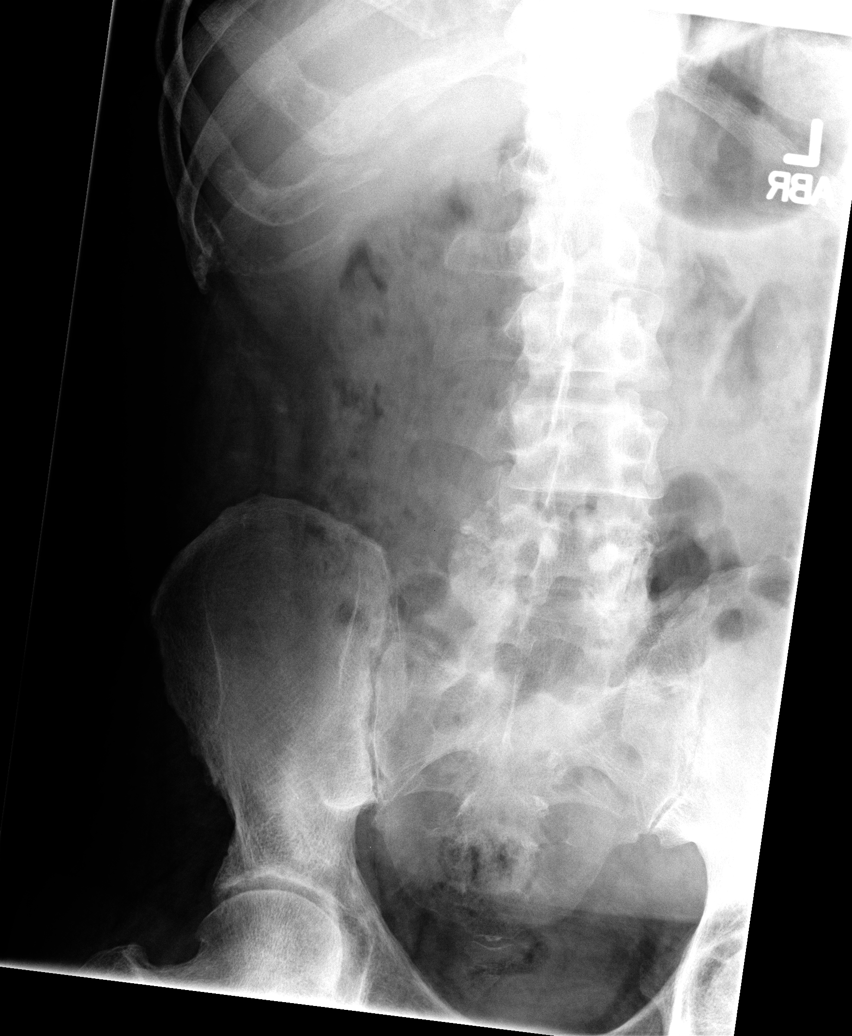

[view not recorded (4 of 5)]
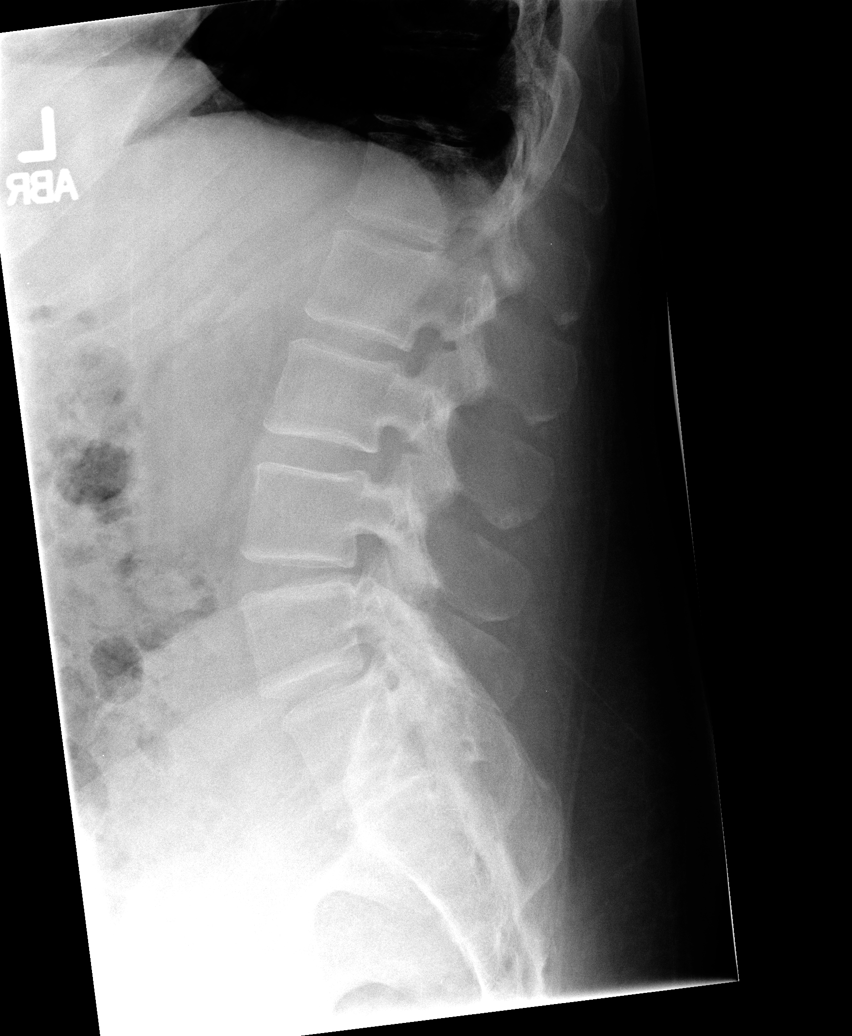

[view not recorded (5 of 5)]
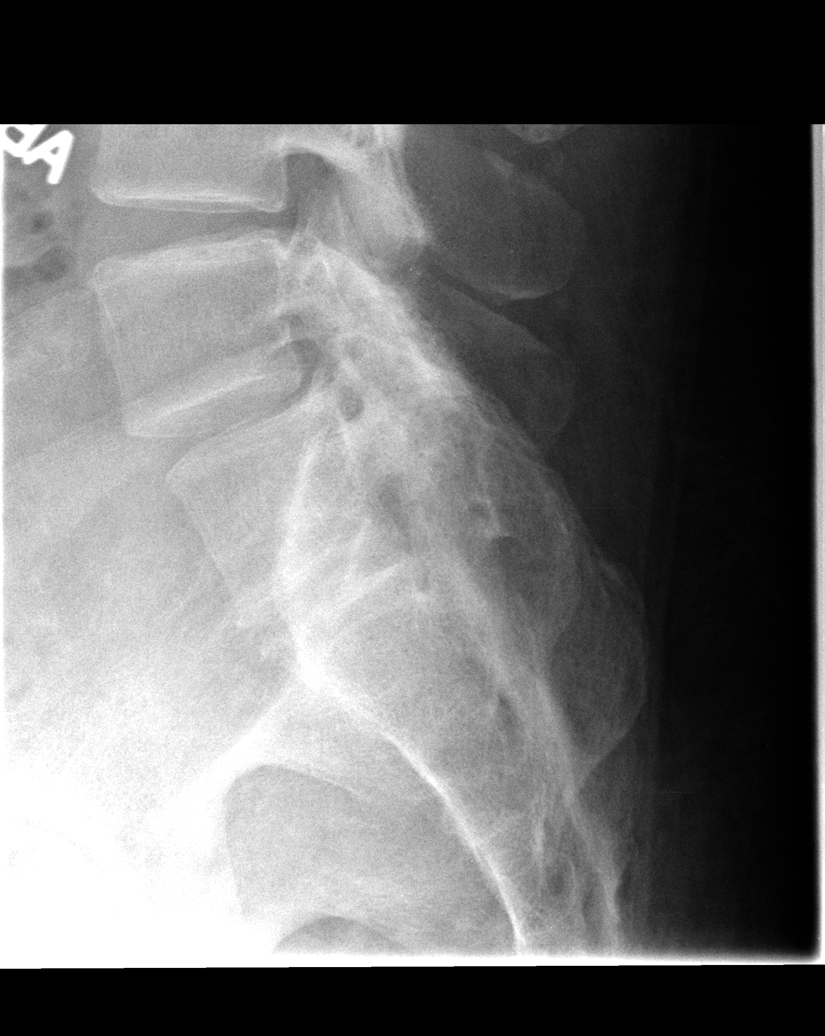

[5 of 5 positions shown; findings below may reference images not displayed]

FINDINGS: Mild degenerative facet disease and in the lower lumbar spine. This
is most pronounced at the lumbosacral junction. Transitional anatomy
at the lumbosacral junction. Slight anterolisthesis of L4 on L5 due
to facet disease. No fracture. SI joints are symmetric and
unremarkable. Mild symmetric degenerative changes in the hips
bilaterally with joint space narrowing and early spurring.
IMPRESSION: Degenerative changes in the lower lumbar spine, predominantly facet
disease. No acute findings.

## 2014-11-07 DIAGNOSIS — D2371 Other benign neoplasm of skin of right lower limb, including hip: Secondary | ICD-10-CM | POA: Diagnosis not present

## 2014-11-08 DIAGNOSIS — I4891 Unspecified atrial fibrillation: Secondary | ICD-10-CM | POA: Diagnosis not present

## 2014-11-21 DIAGNOSIS — I739 Peripheral vascular disease, unspecified: Secondary | ICD-10-CM | POA: Diagnosis not present

## 2014-11-21 DIAGNOSIS — L603 Nail dystrophy: Secondary | ICD-10-CM | POA: Diagnosis not present

## 2014-11-21 DIAGNOSIS — L84 Corns and callosities: Secondary | ICD-10-CM | POA: Diagnosis not present

## 2014-12-13 DIAGNOSIS — I4891 Unspecified atrial fibrillation: Secondary | ICD-10-CM | POA: Diagnosis not present

## 2014-12-13 DIAGNOSIS — E785 Hyperlipidemia, unspecified: Secondary | ICD-10-CM | POA: Diagnosis not present

## 2014-12-13 DIAGNOSIS — I119 Hypertensive heart disease without heart failure: Secondary | ICD-10-CM | POA: Diagnosis not present

## 2015-01-08 ENCOUNTER — Encounter (HOSPITAL_BASED_OUTPATIENT_CLINIC_OR_DEPARTMENT_OTHER): Payer: Medicare Other

## 2015-01-08 ENCOUNTER — Encounter (HOSPITAL_COMMUNITY): Payer: Self-pay | Admitting: Hematology & Oncology

## 2015-01-08 ENCOUNTER — Encounter (HOSPITAL_COMMUNITY): Payer: Medicare Other | Attending: Hematology & Oncology | Admitting: Hematology & Oncology

## 2015-01-08 ENCOUNTER — Telehealth: Payer: Self-pay

## 2015-01-08 VITALS — BP 109/78 | HR 69 | Temp 97.7°F | Resp 18 | Wt 265.8 lb

## 2015-01-08 DIAGNOSIS — D61818 Other pancytopenia: Secondary | ICD-10-CM

## 2015-01-08 LAB — CBC WITH DIFFERENTIAL/PLATELET
BASOS ABS: 0 10*3/uL (ref 0.0–0.1)
BASOS PCT: 0 %
Eosinophils Absolute: 0.3 10*3/uL (ref 0.0–0.7)
Eosinophils Relative: 10 %
HCT: 36.5 % — ABNORMAL LOW (ref 39.0–52.0)
HEMOGLOBIN: 12.2 g/dL — AB (ref 13.0–17.0)
Lymphocytes Relative: 26 %
Lymphs Abs: 0.9 10*3/uL (ref 0.7–4.0)
MCH: 32.2 pg (ref 26.0–34.0)
MCHC: 33.4 g/dL (ref 30.0–36.0)
MCV: 96.3 fL (ref 78.0–100.0)
MONO ABS: 0.6 10*3/uL (ref 0.1–1.0)
Monocytes Relative: 18 %
NEUTROS ABS: 1.6 10*3/uL — AB (ref 1.7–7.7)
NEUTROS PCT: 46 %
RBC: 3.79 MIL/uL — ABNORMAL LOW (ref 4.22–5.81)
RDW: 14.6 % (ref 11.5–15.5)
WBC: 3.5 10*3/uL — AB (ref 4.0–10.5)

## 2015-01-08 NOTE — Telephone Encounter (Signed)
Pt was referred by Dr. Legrand Rams for screening colonoscopy.  I called the phone number on the referral and got [facility]. Could not leave a message. Mailing a letter for pt to call.

## 2015-01-08 NOTE — Progress Notes (Signed)
[place], MD [place] [address]  Pancytopenia - Plan: CBC with Differential, Comprehensive metabolic panel, Sedimentation rate, Lactate dehydrogenase  CURRENT THERAPY: Observation  INTERVAL HISTORY: Bryce Hill 54 y.o. male returns for followup of pancytopenia with negative bone marrow aspiration and biopsy on 11/25/2008.  On chart review, the patient had a leukopenia and thrombocytopenia in 2009. He started having anemia in 2010.      He denies any B symptoms.  He feels good.  He denies any hospitalizations or recurrent antibiotic requirements.    Bryce Hill is here alone today. He says he's been good, eating well, sleeping well. He denies any new pain anywhere, and denies any falls.   Patient has his right shoe off and his leg elevated. His right lower leg is swollen. He says it "goes down at nighttime." He has been seeing a podiatrist for his R foot.  He confirms that he likes to fish, but he hasn't gone fishing for a long time. 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 Thrombocytopenia   . Hypertension   . Hypothyroidism   . Atrial fibrillation   . CVA (cerebral vascular accident)   . Pancytopenia 01/11/2012    Stable    has Pancytopenia and Left-sided low back pain without sciatica on his problem list.     has No Known Allergies.  Bryce Hill does not currently have medications on file.  History reviewed. No pertinent past surgical history.  Denies any headaches, dizziness, double vision, fevers, chills, night sweats, nausea, vomiting, diarrhea, constipation, chest pain, heart palpitations, shortness of breath, blood in stool, black tarry stool, urinary pain, urinary burning, urinary frequency, hematuria.  14 point review of systems was performed and is negative except as detailed under history of present illness and above     PHYSICAL EXAMINATION  ECOG PERFORMANCE  STATUS: 0 - Asymptomatic  Filed Vitals:   01/08/15 1115  BP: 109/78  Pulse: 69  Temp: 97.7 F (36.5 C)  Resp: 18    GENERAL:alert, no distress, well nourished, well developed, comfortable, cooperative, obese and smiling SKIN: skin color, texture, turgor are normal, no rashes or significant lesions HEAD: Normocephalic, No masses, lesions, tenderness or abnormalities  He is missing one of his front teeth. EYES: normal, PERRLA, EOMI, Conjunctiva are pink and non-injected EARS: External ears normal OROPHARYNX:lips, buccal mucosa, and tongue normal and mucous membranes are moist  NECK: supple, trachea midline LYMPH:  no palpable lymphadenopathy, no hepatosplenomegaly BREAST:not examined LUNGS: clear to auscultation and percussion HEART: regular rate & rhythm, no murmurs, no gallops, S1 normal and S2 normal  He has occasional ectopy. ABDOMEN:abdomen soft and normal bowel sounds BACK: Back symmetric, no curvature., No CVA tenderness EXTREMITIES:less then 2 second capillary refill, no joint deformities, effusion, or inflammation, no skin discoloration, no clubbing, no cyanosis   Chronic lower extremity venous stasis changes. NEURO: no focal motor/sensory deficits, gait normal, mentally handicapped   LABORATORY DATA: I have reviewed the data as listed.        ASSESSMENT AND PLAN:  Pancytopenia, NOS Negative BMBX in 2010  His blood counts are stable. He had a negative bone marrow biopsy back in 2010. Currently I see no indication to repeat his bone marrow. If he has any significant change in his counts moving forward we will discuss repeating a bone marrow. For now we will continue with observation. I have scheduled  him for 6 month follow-up.  Orders Placed This Encounter  Procedures  . CBC with Differential    Standing Status: Future     Number of Occurrences:      Standing Expiration Date: 01/08/2016  . Comprehensive metabolic panel    Standing Status: Future     Number  of Occurrences:      Standing Expiration Date: 01/08/2016  . Sedimentation rate    Standing Status: Future     Number of Occurrences:      Standing Expiration Date: 01/08/2016  . Lactate dehydrogenase    Standing Status: Future     Number of Occurrences:      Standing Expiration Date: 01/08/2016    All questions were answered. The patient knows to call the clinic with any problems, questions or concerns. We can certainly see the patient much sooner if necessary.  This document serves as a record of services personally performed by Ancil Linsey, MD. It was created on her behalf by Toni Amend, a trained medical scribe. The creation of this record is based on the scribe's personal observations and the provider's statements to them. This document has been checked and approved by the attending provider.  I have reviewed the above documentation for accuracy and completeness, and I agree with the above.  This note is electronically signed by: Molli Hazard, MD  01/08/2015 12:12 PM

## 2015-01-08 NOTE — Patient Instructions (Signed)
..  [place] [facility] Discharge Instructions  RECOMMENDATIONS MADE BY THE CONSULTANT AND ANY TEST RESULTS WILL BE SENT TO YOUR REFERRING PHYSICIAN.  You will return in 6 months for labs and follow up with the MD.  Please see schedule for appointment list.    Thank you for choosing [place] at [facility] to provide your oncology and hematology care.  To afford each patient quality time with our provider, please arrive at least 15 minutes before your scheduled appointment time.    You need to re-schedule your appointment should you arrive 10 or more minutes late.  We strive to give you quality time with our providers, and arriving late affects you and other patients whose appointments are after yours.  Also, if you no show three or more times for appointments you may be dismissed from the clinic at the providers discretion.     Again, thank you for choosing [facility].  Our hope is that these requests will decrease the amount of time that you wait before being seen by our physicians.       _____________________________________________________________  Should you have questions after your visit to [facility], please contact our office at [phone] between the hours of 8:30 a.m. and 4:30 p.m.  Voicemails left after 4:30 p.m. will not be returned until the following business day.  For prescription refill requests, have your pharmacy contact our office.

## 2015-01-09 NOTE — Progress Notes (Signed)
Labs drawn

## 2015-01-13 DIAGNOSIS — I639 Cerebral infarction, unspecified: Secondary | ICD-10-CM | POA: Diagnosis not present

## 2015-01-13 DIAGNOSIS — I482 Chronic atrial fibrillation: Secondary | ICD-10-CM | POA: Diagnosis not present

## 2015-01-15 DIAGNOSIS — R789 Finding of unspecified substance, not normally found in blood: Secondary | ICD-10-CM | POA: Diagnosis not present

## 2015-01-15 DIAGNOSIS — I6350 Cerebral infarction due to unspecified occlusion or stenosis of unspecified cerebral artery: Secondary | ICD-10-CM | POA: Diagnosis not present

## 2015-01-15 DIAGNOSIS — D61818 Other pancytopenia: Secondary | ICD-10-CM | POA: Diagnosis not present

## 2015-01-15 DIAGNOSIS — I10 Essential (primary) hypertension: Secondary | ICD-10-CM | POA: Diagnosis not present

## 2015-01-15 DIAGNOSIS — Z23 Encounter for immunization: Secondary | ICD-10-CM | POA: Diagnosis not present

## 2015-01-15 DIAGNOSIS — D649 Anemia, unspecified: Secondary | ICD-10-CM | POA: Diagnosis not present

## 2015-01-15 DIAGNOSIS — E039 Hypothyroidism, unspecified: Secondary | ICD-10-CM | POA: Diagnosis not present

## 2015-01-15 DIAGNOSIS — I480 Paroxysmal atrial fibrillation: Secondary | ICD-10-CM | POA: Diagnosis not present

## 2015-01-16 ENCOUNTER — Encounter (HOSPITAL_COMMUNITY): Payer: Self-pay | Admitting: Hematology & Oncology

## 2015-02-06 DIAGNOSIS — I739 Peripheral vascular disease, unspecified: Secondary | ICD-10-CM | POA: Diagnosis not present

## 2015-02-06 DIAGNOSIS — L603 Nail dystrophy: Secondary | ICD-10-CM | POA: Diagnosis not present

## 2015-02-06 DIAGNOSIS — L84 Corns and callosities: Secondary | ICD-10-CM | POA: Diagnosis not present

## 2015-02-12 DIAGNOSIS — I482 Chronic atrial fibrillation: Secondary | ICD-10-CM | POA: Diagnosis not present

## 2015-02-18 ENCOUNTER — Encounter: Payer: Self-pay | Admitting: Internal Medicine

## 2015-03-12 DIAGNOSIS — I482 Chronic atrial fibrillation: Secondary | ICD-10-CM | POA: Diagnosis not present

## 2015-03-25 ENCOUNTER — Ambulatory Visit (INDEPENDENT_AMBULATORY_CARE_PROVIDER_SITE_OTHER): Payer: Medicare Other | Admitting: Gastroenterology

## 2015-03-25 ENCOUNTER — Encounter: Payer: Self-pay | Admitting: Gastroenterology

## 2015-03-25 VITALS — BP 119/77 | HR 82 | Temp 97.1°F | Ht 69.0 in | Wt 274.4 lb

## 2015-03-25 DIAGNOSIS — Z1211 Encounter for screening for malignant neoplasm of colon: Secondary | ICD-10-CM | POA: Diagnosis not present

## 2015-03-25 NOTE — Progress Notes (Signed)
cc'ed to pcp °

## 2015-03-25 NOTE — Patient Instructions (Signed)
We have scheduled you for a colonoscopy with Dr. Gala Romney.  DO NOT TAKE COUMADIN FOR 3 DAYS PRIOR TO PROCEDURE.

## 2015-03-25 NOTE — Progress Notes (Signed)
Primary Care Physician:  [name], MD Primary Gastroenterologist:  Dr. Gala Romney   Chief Complaint  Patient presents with  . set up TCS    HPI:   Bryce Hill is a 54 y.o. male presenting today at the request of Dr. Legrand Rams to arrange an initial screening colonoscopy. Resident at [facility]. On Coumadin for afib.   Denies constipation, diarrhea, rectal bleeding. No loss of appetite. No reflux, dysphagia. No unexplained weight loss. No concerning upper or lower GI symptoms.   Past Medical History  Diagnosis Date  . Thrombocytopenia ([place])   . Hypertension   . Hypothyroidism   . Atrial fibrillation ([place])   . CVA (cerebral vascular accident) ([place])   . Pancytopenia ([place]) 01/11/2012    Stable    Past Surgical History  Procedure Laterality Date  . Hernia repair      as child    Current Outpatient Prescriptions  Medication Sig Dispense Refill  . acetaminophen (TYLENOL) 325 MG tablet Take 650 mg by mouth every 6 (six) hours as needed.    . Albuterol Sulfate 108 (90 BASE) MCG/ACT AEPB Inhale into the lungs.    Marland Kitchen amLODipine (NORVASC) 5 MG tablet Take 5 mg by mouth daily.      . carvedilol (COREG) 12.5 MG tablet Take 12.5 mg by mouth 2 (two) times daily with a meal.      . furosemide (LASIX) 20 MG tablet     . levothyroxine (SYNTHROID, LEVOTHROID) 100 MCG tablet Take 125 mcg by mouth daily.     Marland Kitchen lisinopril (PRINIVIL,ZESTRIL) 20 MG tablet     . loratadine (CLARITIN) 10 MG tablet Take 10 mg by mouth daily.    . potassium chloride SA (K-DUR,KLOR-CON) 20 MEQ tablet Take 20 mEq by mouth 2 (two) times daily.      Marland Kitchen warfarin (COUMADIN) 5 MG tablet Take 5 mg by mouth daily. 5 mg MWF; 7.5 mg Tuesday, Thurs, Sat, Sun    . Psyllium (QC FIBER LAXATIVE PO) Take 625 mg by mouth daily. 2 tabs      No current facility-administered medications for this visit.    Allergies as of 03/25/2015  . (No Known Allergies)    Family History  Problem Relation Age of Onset  . Cancer Mother     . Colon cancer      unknown but THINKS his mom may have had    Social History   Social History  . Marital Status: Single    Spouse Name: N/A  . Number of Children: N/A  . Years of Education: N/A   Occupational History  . Not on file.   Social History Main Topics  . Smoking status: Never Smoker   . Smokeless tobacco: Never Used  . Alcohol Use: No  . Drug Use: No  . Sexual Activity: Not on file   Other Topics Concern  . Not on file   Social History Narrative    Review of Systems: Gen: Denies any fever, chills, fatigue, weight loss, lack of appetite.  CV: Denies chest pain, heart palpitations, peripheral edema, syncope.  Resp: Denies shortness of breath at rest or with exertion. Denies wheezing or cough.  GI: see HPI  GU : Denies urinary burning, urinary frequency, urinary hesitancy MS: Denies joint pain, muscle weakness, cramps, or limitation of movement.  Derm: Denies rash, itching, dry skin Psych: Denies depression, anxiety, memory loss, and confusion Heme: Denies bruising, bleeding, and enlarged lymph nodes.  Physical Exam: BP 119/77 mmHg  Pulse  82  Temp(Src) 97.1 F (36.2 C)  Ht 5\' 9"  (1.753 m)  Wt 274 lb 6.4 oz (124.467 kg)  BMI 40.50 kg/m2 General:   Alert and oriented. Pleasant and cooperative. Well-nourished and well-developed. Sometimes difficult to understand his speech.  Head:  Normocephalic and atraumatic. Eyes:  Without icterus, sclera clear and conjunctiva pink.  Ears:  Normal auditory acuity. Nose:  No deformity, discharge,  or lesions. Mouth:  No deformity or lesions, oral mucosa pink. Poor dentition.  Lungs:  Clear to auscultation bilaterally. Heart:  S1, S2 present, irregularly irregular  Abdomen:  +BS, soft, non-tender and non-distended. Obese. Difficult to appreciate HSM.  Rectal:  Deferred  Msk:  Symmetrical without gross deformities. Normal posture. Extremities:  2-3+ lower extremity edema bilaterally  Neurologic:  Alert and  oriented  x4 Psych:  Alert and cooperative. Normal mood and affect.

## 2015-03-25 NOTE — Assessment & Plan Note (Signed)
54 year old male with need for initial screening colonoscopy, completely devoid of any concerning lower or upper GI symptoms. He is a poor historian and thinks his mother may have had colon cancer, but he is unsure. On Coumadin for afib. Will request to hold this X 3 days, which will likely not be an issue.   Proceed with TCS with Dr. Gala Romney in near future: the risks, benefits, and alternatives have been discussed with the patient in detail. The patient states understanding and desires to proceed. Hold Coumadin X 3 days prior

## 2015-03-26 ENCOUNTER — Telehealth: Payer: Self-pay

## 2015-03-26 NOTE — Telephone Encounter (Signed)
Per Laban Emperor, NP, I faxed request to Dr. Legrand Rams on 03/25/2015 to ask if OK to hold coumadin x 3 days prior to colonoscopy. Just received fax that Dr. Rosalita Chessman in [place] is the one who follows pt for his coumadin and they have faxed the request to him.

## 2015-03-31 NOTE — Telephone Encounter (Signed)
I called Dr. Milta Deiters office. They do not have any info on this request. I spoke to [facility] and told her I will refax the request.  Faxed to [phone].

## 2015-04-02 ENCOUNTER — Other Ambulatory Visit: Payer: Self-pay

## 2015-04-02 MED ORDER — PEG 3350-KCL-NA BICARB-NACL 420 G PO SOLR: 4000.0000 mL | Freq: Once | ORAL | Status: AC

## 2015-04-02 MED ORDER — PHOSPHATE LAXATIVE 2.7-7.2 GM/15ML PO SOLN: 15.0000 mL | Freq: Once | ORAL | Status: DC

## 2015-04-02 MED ORDER — BISACODYL 5 MG PO TBEC: 5.0000 mg | DELAYED_RELEASE_TABLET | Freq: Every day | ORAL | Status: DC | PRN

## 2015-04-02 NOTE — Telephone Encounter (Signed)
Noted  

## 2015-04-02 NOTE — Telephone Encounter (Signed)
Letter received by fax from Dr. Rosalita Chessman, cardiologist. He said this pt had been evaluated by him and cleared for his upcoming surgery . Pt may stop Coumadin x 3 days prior to procedure and restart 24 hours afterwards.  Forwarding the letter to Laban Emperor, NP , and then to be scanned into epic. Routing to [place] to complete the scheduling.

## 2015-04-02 NOTE — Progress Notes (Signed)
Holding Coumadin X 3 days was verified with Dr. Bennett Scrape, [facility].

## 2015-04-02 NOTE — Telephone Encounter (Signed)
Pt is set for procedure on 04/22/2015. Mailed instructions and sent in prep to [facility]

## 2015-04-07 ENCOUNTER — Other Ambulatory Visit: Payer: Self-pay

## 2015-04-07 DIAGNOSIS — Z1211 Encounter for screening for malignant neoplasm of colon: Secondary | ICD-10-CM

## 2015-04-10 DIAGNOSIS — I4891 Unspecified atrial fibrillation: Secondary | ICD-10-CM | POA: Diagnosis not present

## 2015-04-22 ENCOUNTER — Encounter (HOSPITAL_COMMUNITY)
Admission: RE | Disposition: A | Discharge status: Home / Self Care | Payer: Self-pay | Source: Ambulatory Visit | Attending: Internal Medicine

## 2015-04-22 ENCOUNTER — Encounter (HOSPITAL_COMMUNITY): Payer: Self-pay | Admitting: *Deleted

## 2015-04-22 ENCOUNTER — Ambulatory Visit (HOSPITAL_COMMUNITY)
Admission: RE | Admit: 2015-04-22 | Discharge: 2015-04-22 | Disposition: A | Discharge status: Home / Self Care | Payer: Medicare Other | Source: Ambulatory Visit | Attending: Internal Medicine | Admitting: Internal Medicine

## 2015-04-22 DIAGNOSIS — E039 Hypothyroidism, unspecified: Secondary | ICD-10-CM | POA: Diagnosis not present

## 2015-04-22 DIAGNOSIS — Z1211 Encounter for screening for malignant neoplasm of colon: Secondary | ICD-10-CM | POA: Diagnosis not present

## 2015-04-22 DIAGNOSIS — Z79899 Other long term (current) drug therapy: Secondary | ICD-10-CM | POA: Diagnosis not present

## 2015-04-22 DIAGNOSIS — I10 Essential (primary) hypertension: Secondary | ICD-10-CM | POA: Insufficient documentation

## 2015-04-22 DIAGNOSIS — Z7901 Long term (current) use of anticoagulants: Secondary | ICD-10-CM | POA: Insufficient documentation

## 2015-04-22 DIAGNOSIS — I4891 Unspecified atrial fibrillation: Secondary | ICD-10-CM | POA: Diagnosis not present

## 2015-04-22 DIAGNOSIS — K5730 Diverticulosis of large intestine without perforation or abscess without bleeding: Secondary | ICD-10-CM | POA: Diagnosis not present

## 2015-04-22 HISTORY — PX: COLONOSCOPY: SHX5424

## 2015-04-22 SURGERY — COLONOSCOPY
Anesthesia: Moderate Sedation

## 2015-04-22 MED ORDER — ONDANSETRON HCL 4 MG/2ML IJ SOLN
INTRAMUSCULAR | Status: AC
  Filled 2015-04-22: qty 2

## 2015-04-22 MED ORDER — SODIUM CHLORIDE 0.9 % IV SOLN
INTRAVENOUS | Status: DC
  Administered 2015-04-22: 1000 mL via INTRAVENOUS

## 2015-04-22 MED ORDER — MIDAZOLAM HCL 5 MG/5ML IJ SOLN
INTRAMUSCULAR | Status: DC | PRN
  Administered 2015-04-22: 2 mg via INTRAVENOUS
  Administered 2015-04-22: 1 mg via INTRAVENOUS

## 2015-04-22 MED ORDER — MIDAZOLAM HCL 5 MG/5ML IJ SOLN
INTRAMUSCULAR | Status: AC
  Filled 2015-04-22: qty 10

## 2015-04-22 MED ORDER — MEPERIDINE HCL 100 MG/ML IJ SOLN
INTRAMUSCULAR | Status: AC
  Filled 2015-04-22: qty 2

## 2015-04-22 MED ORDER — ONDANSETRON HCL 4 MG/2ML IJ SOLN
INTRAMUSCULAR | Status: DC | PRN
  Administered 2015-04-22: 4 mg via INTRAVENOUS

## 2015-04-22 MED ORDER — MEPERIDINE HCL 100 MG/ML IJ SOLN
INTRAMUSCULAR | Status: DC | PRN
  Administered 2015-04-22: 50 mg via INTRAVENOUS
  Administered 2015-04-22: 25 mg via INTRAVENOUS

## 2015-04-22 MED ORDER — SIMETHICONE 40 MG/0.6ML PO SUSP
ORAL | Status: DC | PRN
  Administered 2015-04-22: 14:00:00

## 2015-04-22 NOTE — H&P (View-Only) (Signed)
Primary Care Physician:  [name], MD Primary Gastroenterologist:  Dr. Gala Romney   Chief Complaint  Patient presents with  . set up TCS    HPI:   Bryce Hill is a 55 y.o. male presenting today at the request of Dr. Legrand Rams to arrange an initial screening colonoscopy. Resident at [facility]. On Coumadin for afib.   Denies constipation, diarrhea, rectal bleeding. No loss of appetite. No reflux, dysphagia. No unexplained weight loss. No concerning upper or lower GI symptoms.   Past Medical History  Diagnosis Date  . Thrombocytopenia ([place])   . Hypertension   . Hypothyroidism   . Atrial fibrillation ([place])   . CVA (cerebral vascular accident) ([place])   . Pancytopenia ([place]) 01/11/2012    Stable    Past Surgical History  Procedure Laterality Date  . Hernia repair      as child    Current Outpatient Prescriptions  Medication Sig Dispense Refill  . acetaminophen (TYLENOL) 325 MG tablet Take 650 mg by mouth every 6 (six) hours as needed.    . Albuterol Sulfate 108 (90 BASE) MCG/ACT AEPB Inhale into the lungs.    Marland Kitchen amLODipine (NORVASC) 5 MG tablet Take 5 mg by mouth daily.      . carvedilol (COREG) 12.5 MG tablet Take 12.5 mg by mouth 2 (two) times daily with a meal.      . furosemide (LASIX) 20 MG tablet     . levothyroxine (SYNTHROID, LEVOTHROID) 100 MCG tablet Take 125 mcg by mouth daily.     Marland Kitchen lisinopril (PRINIVIL,ZESTRIL) 20 MG tablet     . loratadine (CLARITIN) 10 MG tablet Take 10 mg by mouth daily.    . potassium chloride SA (K-DUR,KLOR-CON) 20 MEQ tablet Take 20 mEq by mouth 2 (two) times daily.      Marland Kitchen warfarin (COUMADIN) 5 MG tablet Take 5 mg by mouth daily. 5 mg MWF; 7.5 mg Tuesday, Thurs, Sat, Sun    . Psyllium (QC FIBER LAXATIVE PO) Take 625 mg by mouth daily. 2 tabs      No current facility-administered medications for this visit.    Allergies as of 03/25/2015  . (No Known Allergies)    Family History  Problem Relation Age of Onset  . Cancer Mother     . Colon cancer      unknown but THINKS his mom may have had    Social History   Social History  . Marital Status: Single    Spouse Name: N/A  . Number of Children: N/A  . Years of Education: N/A   Occupational History  . Not on file.   Social History Main Topics  . Smoking status: Never Smoker   . Smokeless tobacco: Never Used  . Alcohol Use: No  . Drug Use: No  . Sexual Activity: Not on file   Other Topics Concern  . Not on file   Social History Narrative    Review of Systems: Gen: Denies any fever, chills, fatigue, weight loss, lack of appetite.  CV: Denies chest pain, heart palpitations, peripheral edema, syncope.  Resp: Denies shortness of breath at rest or with exertion. Denies wheezing or cough.  GI: see HPI  GU : Denies urinary burning, urinary frequency, urinary hesitancy MS: Denies joint pain, muscle weakness, cramps, or limitation of movement.  Derm: Denies rash, itching, dry skin Psych: Denies depression, anxiety, memory loss, and confusion Heme: Denies bruising, bleeding, and enlarged lymph nodes.  Physical Exam: BP 119/77 mmHg  Pulse  82  Temp(Src) 97.1 F (36.2 C)  Ht 5\' 9"  (1.753 m)  Wt 274 lb 6.4 oz (124.467 kg)  BMI 40.50 kg/m2 General:   Alert and oriented. Pleasant and cooperative. Well-nourished and well-developed. Sometimes difficult to understand his speech.  Head:  Normocephalic and atraumatic. Eyes:  Without icterus, sclera clear and conjunctiva pink.  Ears:  Normal auditory acuity. Nose:  No deformity, discharge,  or lesions. Mouth:  No deformity or lesions, oral mucosa pink. Poor dentition.  Lungs:  Clear to auscultation bilaterally. Heart:  S1, S2 present, irregularly irregular  Abdomen:  +BS, soft, non-tender and non-distended. Obese. Difficult to appreciate HSM.  Rectal:  Deferred  Msk:  Symmetrical without gross deformities. Normal posture. Extremities:  2-3+ lower extremity edema bilaterally  Neurologic:  Alert and  oriented  x4 Psych:  Alert and cooperative. Normal mood and affect.

## 2015-04-22 NOTE — Op Note (Signed)
[facility] [address] [place], [address]   COLONOSCOPY PROCEDURE REPORT  PATIENT: Bryce Hill, Bryce Hill  MR#: [id number] BIRTHDATE: 1960/10/10 , 63  yrs. old GENDER: male ENDOSCOPIST: R.  Garfield Cornea, MD FACP [facility] REFERRED [id number] Legrand Rams, M.D. PROCEDURE DATE:  2015/04/27 PROCEDURE:   Colonoscopy, screening INDICATIONS:First-ever average risk colorectal cancer screening examination. MEDICATIONS: Versed 8 mg IV and Demerol 75 mg IV in divided doses. Zofran 4 mg IV. ASA CLASS:       Class III  CONSENT: The risks, benefits, alternatives and imponderables including but not limited to bleeding, perforation as well as the possibility of a missed lesion have been reviewed.  The potential for biopsy, lesion removal, etc. have also been discussed. Questions have been answered.  All parties agreeable.  Please see the history and physical in the medical record for more information.  DESCRIPTION OF PROCEDURE:   After the risks benefits and alternatives of the procedure were thoroughly explained, informed consent was obtained.  The digital rectal exam revealed no abnormalities of the perianal region.   The [id number] endoscope was introduced through the anus and advanced to the cecum, which was identified by both the appendix and ileocecal valve. No adverse events experienced.   The quality of the prep was adequate  The instrument was then slowly withdrawn as the colon was fully examined. Estimated blood loss is zero unless otherwise noted in this procedure report.      COLON FINDINGS: Normal-appearing rectal mucosa.  Patient densely populated colonic diverticula from the rectosigmoid junction all the way to the cecum; otherwise, the remainder of the colonic mucosa appeared normal.  Retroflexion was performed. .  Withdrawal time=7 minutes 0 seconds.  The scope was withdrawn and the procedure completed. COMPLICATIONS: There were no immediate  complications.  ENDOSCOPIC IMPRESSION: Colonic diverticulosis  RECOMMENDATIONS: Repeat screening colonoscopy in 10 years  eSigned:  R. Garfield Cornea, MD Rosalita Chessman [facility] 04/27/2015 2:50 PM   cc:  CPT CODES: ICD CODES:  The ICD and CPT codes recommended by this software are interpretations from the data that the clinical staff has captured with the software.  The verification of the translation of this report to the ICD and CPT codes and modifiers is the sole responsibility of the health care institution and practicing physician where this report was generated.  [employer]. will not be held responsible for the validity of the ICD and CPT codes included on this report.  AMA assumes no liability for data contained or not contained herein. CPT is a Designer, television/film set of the [employer].

## 2015-04-22 NOTE — Interval H&P Note (Signed)
History and Physical Interval Note:  04/22/2015 2:15 PM  Bryce Hill  has presented today for surgery, with the diagnosis of screening  The various methods of treatment have been discussed with the patient and family. After consideration of risks, benefits and other options for treatment, the patient has consented to  Procedure(s) with comments: COLONOSCOPY (N/A) - [id number] as a surgical intervention .  The patient's history has been reviewed, patient examined, no change in status, stable for surgery.  I have reviewed the patient's chart and labs.  Questions were answered to the patient's satisfaction.     [name]  No change. First-ever average risk screening colonoscopy.  The risks, benefits, limitations, alternatives and imponderables have been reviewed with the patient. Questions have been answered. All parties are agreeable.

## 2015-04-22 NOTE — Discharge Instructions (Signed)
Colonoscopy Discharge Instructions  Read the instructions outlined below and refer to this sheet in the next few weeks. These discharge instructions provide you with general information on caring for yourself after you leave the hospital. Your doctor may also give you specific instructions. While your treatment has been planned according to the most current medical practices available, unavoidable complications occasionally occur. If you have any problems or questions after discharge, call Dr. Gala Romney at [phone]. ACTIVITY  You may resume your regular activity, but move at a slower pace for the next 24 hours.   Take frequent rest periods for the next 24 hours.   Walking will help get rid of the air and reduce the bloated feeling in your belly (abdomen).   No driving for 24 hours (because of the medicine (anesthesia) used during the test).    Do not sign any important legal documents or operate any machinery for 24 hours (because of the anesthesia used during the test).  NUTRITION  Drink plenty of fluids.   You may resume your normal diet as instructed by your doctor.   Begin with a light meal and progress to your normal diet. Heavy or fried foods are harder to digest and may make you feel sick to your stomach (nauseated).   Avoid alcoholic beverages for 24 hours or as instructed.  MEDICATIONS  You may resume your normal medications unless your doctor tells you otherwise.  WHAT YOU CAN EXPECT TODAY  Some feelings of bloating in the abdomen.   Passage of more gas than usual.   Spotting of blood in your stool or on the toilet paper.  IF YOU HAD POLYPS REMOVED DURING THE COLONOSCOPY:  No aspirin products for 7 days or as instructed.   No alcohol for 7 days or as instructed.   Eat a soft diet for the next 24 hours.  FINDING OUT THE RESULTS OF YOUR TEST Not all test results are available during your visit. If your test results are not back during the visit, make an appointment  with your caregiver to find out the results. Do not assume everything is normal if you have not heard from your caregiver or the medical facility. It is important for you to follow up on all of your test results.  SEEK IMMEDIATE MEDICAL ATTENTION IF:  You have more than a spotting of blood in your stool.   Your belly is swollen (abdominal distention).   You are nauseated or vomiting.   You have a temperature over 101.   You have abdominal pain or discomfort that is severe or gets worse throughout the day.    Diverticulosis information provided  Recommend repeat screening colonoscopy in 10 years  Resume Coumadin today    Diverticulosis Diverticulosis is the condition that develops when small pouches (diverticula) form in the wall of your colon. Your colon, or large intestine, is where water is absorbed and stool is formed. The pouches form when the inside layer of your colon pushes through weak spots in the outer layers of your colon. CAUSES  No one knows exactly what causes diverticulosis. RISK FACTORS  Being older than 23. Your risk for this condition increases with age. Diverticulosis is rare in people younger than 40 years. By age 82, almost everyone has it.  Eating a low-fiber diet.  Being frequently constipated.  Being overweight.  Not getting enough exercise.  Smoking.  Taking over-the-counter pain medicines, like aspirin and ibuprofen. SYMPTOMS  Most people with diverticulosis do not have symptoms. DIAGNOSIS  Because diverticulosis often has no symptoms, health care providers often discover the condition during an exam for other colon problems. In many cases, a health care provider will diagnose diverticulosis while using a flexible scope to examine the colon (colonoscopy). TREATMENT  If you have never developed an infection related to diverticulosis, you may not need treatment. If you have had an infection before, treatment may include:  Eating more fruits,  vegetables, and grains.  Taking a fiber supplement.  Taking a live bacteria supplement (probiotic).  Taking medicine to relax your colon. HOME CARE INSTRUCTIONS   Drink at least 6-8 glasses of water each day to prevent constipation.  Try not to strain when you have a bowel movement.  Keep all follow-up appointments. If you have had an infection before:  Increase the fiber in your diet as directed by your health care provider or dietitian.  Take a dietary fiber supplement if your health care provider approves.  Only take medicines as directed by your health care provider. SEEK MEDICAL CARE IF:   You have abdominal pain.  You have bloating.  You have cramps.  You have not gone to the bathroom in 3 days. SEEK IMMEDIATE MEDICAL CARE IF:   Your pain gets worse.  Yourbloating becomes very bad.  You have a fever or chills, and your symptoms suddenly get worse.  You begin vomiting.  You have bowel movements that are bloody or black. MAKE SURE YOU:  Understand these instructions.  Will watch your condition.  Will get help right away if you are not doing well or get worse.   This information is not intended to replace advice given to you by your health care provider. Make sure you discuss any questions you have with your health care provider.   Document Released: 01/01/2004 Document Revised: 04/10/2013 Document Reviewed: 02/28/2013 Elsevier Interactive Patient Education [employer].

## 2015-04-29 ENCOUNTER — Encounter (HOSPITAL_COMMUNITY): Payer: Self-pay | Admitting: Internal Medicine

## 2015-05-01 DIAGNOSIS — L84 Corns and callosities: Secondary | ICD-10-CM | POA: Diagnosis not present

## 2015-05-01 DIAGNOSIS — L603 Nail dystrophy: Secondary | ICD-10-CM | POA: Diagnosis not present

## 2015-05-01 DIAGNOSIS — I739 Peripheral vascular disease, unspecified: Secondary | ICD-10-CM | POA: Diagnosis not present

## 2015-05-15 DIAGNOSIS — I482 Chronic atrial fibrillation: Secondary | ICD-10-CM | POA: Diagnosis not present

## 2015-05-21 DIAGNOSIS — I10 Essential (primary) hypertension: Secondary | ICD-10-CM | POA: Diagnosis not present

## 2015-05-21 DIAGNOSIS — I480 Paroxysmal atrial fibrillation: Secondary | ICD-10-CM | POA: Diagnosis not present

## 2015-05-21 DIAGNOSIS — E039 Hypothyroidism, unspecified: Secondary | ICD-10-CM | POA: Diagnosis not present

## 2015-06-17 DIAGNOSIS — I480 Paroxysmal atrial fibrillation: Secondary | ICD-10-CM | POA: Diagnosis not present

## 2015-07-07 NOTE — Assessment & Plan Note (Addendum)
Stable x years with more recent labs showing intermittent anemia.  Bone marrow aspiration and biopsy was negative on 11/25/2008 without any abnormal cytogentics.    Labs today: CBC diff, CMET, LDH, ESR.  Labs today demonstrate a mild hyperbilirubinemia. Otherwise stable. I'll do a short interval repeat lab in 6 weeks to verify that bilirubin is not changing or is improved.  Labs in 6 months: CBC diff, CMET, LDH, ESR  If significant changes in laboratory work is documented, I would not hesitate to pursue a repeat bone marrow aspiration and biopsy.  I reviewed colonoscopy report with the patient. He will be due for repeat colonoscopy in 2027.  Return in 6 months for follow-up.

## 2015-07-07 NOTE — Progress Notes (Signed)
[place], MD [place] [address]  Pancytopenia [facility]) - Plan: CBC with Differential, Comprehensive metabolic panel, Lactate dehydrogenase, Sedimentation rate, CBC with Differential, Comprehensive metabolic panel, Lactate dehydrogenase, Sedimentation rate, CBC with Differential, Comprehensive metabolic panel  CURRENT THERAPY: Observation  INTERVAL HISTORY: Orvell Careaga 55 y.o. male returns for followup of pancytopenia with negative bone marrow aspiration and biopsy on 11/25/2008.  On chart review, the patient had a leukopenia and thrombocytopenia in 2009. He started having anemia in 2010.      I personally reviewed and went over laboratory results with the patient.  The results are noted within this dictation. Minimal hyperbilirubinemia is noted today with labs. As a result will repeat labs in approximately 6 weeks' time to confirm that it stable and/or back to normal  Mr. Nigh underwent initial screening colonoscopy by Dr. Gala Romney on 04/22/2015.  Endoscopic exam reveals colonic diverticulosis and repeat colonoscopy is recommended in 10 years.  He denies any B symptoms.  He feels good.  He denies any hospitalizations or recurrent antibiotic requirements.   He asked if I will examine his ears.   Hematologically, he denies any complaints and ROS questioning is negative.   Past Medical History  Diagnosis Date  . Thrombocytopenia ([place])   . Hypertension   . Hypothyroidism   . Atrial fibrillation ([place])   . CVA (cerebral vascular accident) ([place])   . Pancytopenia ([place]) 01/11/2012    Stable    has Pancytopenia ([place]); Left-sided low back pain without sciatica; Encounter for screening colonoscopy; Colon cancer screening; and Diverticulosis of colon without hemorrhage on his problem list.     has No Known Allergies.  Mr. Meas does not currently have medications on file.  Past Surgical History  Procedure Laterality Date  . Hernia repair     as child  . Colonoscopy N/A 04/22/2015    Procedure: COLONOSCOPY;  Surgeon: Daneil Dolin, MD;  Location: [facility];  Service: Endoscopy;  Laterality: N/A;  1245    Denies any headaches, dizziness, double vision, fevers, chills, night sweats, nausea, vomiting, diarrhea, constipation, chest pain, heart palpitations, shortness of breath, blood in stool, black tarry stool, urinary pain, urinary burning, urinary frequency, hematuria.   PHYSICAL EXAMINATION  ECOG PERFORMANCE STATUS: 0 - Asymptomatic  Filed Vitals:   07/08/15 0932  BP: 114/68  Pulse: 81  Temp: 97.7 F (36.5 C)  Resp: 18    GENERAL:alert, no distress, well nourished, well developed, comfortable, cooperative, obese and smiling, Unaccompanied SKIN: skin color, texture, turgor are normal, no rashes or significant lesions HEAD: Normocephalic, No masses, lesions, tenderness or abnormalities EYES: normal, PERRLA, EOMI, Conjunctiva are pink and non-injected EARS: External ears normal, bilateral cerumen without in [place] or obstruction, bilateral peripherally white tympanic membranes without erythema or effusion. OROPHARYNX:lips, buccal mucosa, and tongue normal and mucous membranes are moist  NECK: supple, trachea midline LYMPH:  no palpable lymphadenopathy, no hepatosplenomegaly BREAST:not examined LUNGS: clear to auscultation and percussion without wheezes, rales, or rhonchi. HEART: regular rate & rhythm with occasional ectopy, no murmurs, no gallops, S1 normal and S2 normal ABDOMEN:abdomen soft and normal bowel sounds BACK: Back symmetric, no curvature., No CVA tenderness EXTREMITIES:less then 2 second capillary refill, no joint deformities, effusion, or inflammation, no skin discoloration, no clubbing, no cyanosis  NEURO: no focal motor/sensory deficits, gait normal, mentally handicapped    LABORATORY DATA: CBC    Component Value Date/Time   WBC 3.6* 07/08/2015 0908   WBC 2.3*  02/18/2012 1619   RBC 3.83*  07/08/2015 0908   RBC 4.02* 02/18/2012 1619   HGB 12.4* 07/08/2015 0908   HGB 13.1 02/18/2012 1619   HCT 37.3* 07/08/2015 0908   HCT 38.7 02/18/2012 1619   PLT 68* 07/08/2015 0908   PLT 64* 02/18/2012 1619   MCV 97.4 07/08/2015 0908   MCV 96.4 02/18/2012 1619   MCH 32.4 07/08/2015 0908   MCH 32.7 02/18/2012 1619   MCHC 33.2 07/08/2015 0908   MCHC 33.9 02/18/2012 1619   RDW 14.7 07/08/2015 0908   RDW 15.1* 02/18/2012 1619   LYMPHSABS 1.1 07/08/2015 0908   LYMPHSABS 0.8* 02/18/2012 1619   MONOABS 0.4 07/08/2015 0908   MONOABS 0.4 02/18/2012 1619   EOSABS 0.4 07/08/2015 0908   EOSABS 0.1 02/18/2012 1619   BASOSABS 0.0 07/08/2015 0908   BASOSABS 0.0 02/18/2012 1619      Chemistry      Component Value Date/Time   NA 138 07/08/2015 0908   K 4.5 07/08/2015 0908   CL 105 07/08/2015 0908   CO2 28 07/08/2015 0908   BUN 22* 07/08/2015 0908   CREATININE 0.99 07/08/2015 0908      Component Value Date/Time   CALCIUM 8.9 07/08/2015 0908   ALKPHOS 38 07/08/2015 0908   AST 23 07/08/2015 0908   ALT 20 07/08/2015 0908   BILITOT 1.4* 07/08/2015 0908        RADIOGRAPHIC STUDIES:    ASSESSMENT AND PLAN:  Pancytopenia ([place]) Stable x years with more recent labs showing intermittent anemia.  Bone marrow aspiration and biopsy was negative on 11/25/2008 without any abnormal cytogentics.    Labs today: CBC diff, CMET, LDH, ESR.  Labs today demonstrate a mild hyperbilirubinemia. Otherwise stable. I'll do a short interval repeat lab in 6 weeks to verify that bilirubin is not changing or is improved.  Labs in 6 months: CBC diff, CMET, LDH, ESR  If significant changes in laboratory work is documented, I would not hesitate to pursue a repeat bone marrow aspiration and biopsy.  I reviewed colonoscopy report with the patient. He will be due for repeat colonoscopy in 2027.  Return in 6 months for follow-up.  THERAPY PLAN:  Will continue to monitor counts.  All questions were answered.  The patient knows to call the clinic with any problems, questions or concerns. We can certainly see the patient much sooner if necessary.  Patient and plan discussed with Dr. Ancil Linsey and she is in agreement with the aforementioned.   This note is electronically signed by: Robynn Pane 07/08/2015 3:01 PM

## 2015-07-08 ENCOUNTER — Encounter (HOSPITAL_COMMUNITY): Payer: Self-pay | Admitting: Oncology

## 2015-07-08 ENCOUNTER — Ambulatory Visit (HOSPITAL_COMMUNITY): Payer: Medicare Other | Admitting: Oncology

## 2015-07-08 ENCOUNTER — Encounter (HOSPITAL_BASED_OUTPATIENT_CLINIC_OR_DEPARTMENT_OTHER): Payer: Medicare Other | Admitting: Oncology

## 2015-07-08 ENCOUNTER — Encounter (HOSPITAL_COMMUNITY): Payer: Medicare Other | Attending: Hematology & Oncology

## 2015-07-08 VITALS — BP 114/68 | HR 81 | Temp 97.7°F | Resp 18 | Wt 263.0 lb

## 2015-07-08 DIAGNOSIS — I4891 Unspecified atrial fibrillation: Secondary | ICD-10-CM | POA: Diagnosis not present

## 2015-07-08 DIAGNOSIS — R17 Unspecified jaundice: Secondary | ICD-10-CM | POA: Diagnosis not present

## 2015-07-08 DIAGNOSIS — D649 Anemia, unspecified: Secondary | ICD-10-CM | POA: Diagnosis not present

## 2015-07-08 DIAGNOSIS — D696 Thrombocytopenia, unspecified: Secondary | ICD-10-CM | POA: Insufficient documentation

## 2015-07-08 DIAGNOSIS — I10 Essential (primary) hypertension: Secondary | ICD-10-CM | POA: Insufficient documentation

## 2015-07-08 DIAGNOSIS — E039 Hypothyroidism, unspecified: Secondary | ICD-10-CM | POA: Diagnosis not present

## 2015-07-08 DIAGNOSIS — Z8673 Personal history of transient ischemic attack (TIA), and cerebral infarction without residual deficits: Secondary | ICD-10-CM | POA: Diagnosis not present

## 2015-07-08 DIAGNOSIS — Z9889 Other specified postprocedural states: Secondary | ICD-10-CM | POA: Diagnosis not present

## 2015-07-08 DIAGNOSIS — D61818 Other pancytopenia: Secondary | ICD-10-CM | POA: Insufficient documentation

## 2015-07-08 LAB — CBC WITH DIFFERENTIAL/PLATELET
BASOS PCT: 0 %
Basophils Absolute: 0 10*3/uL (ref 0.0–0.1)
EOS ABS: 0.4 10*3/uL (ref 0.0–0.7)
EOS PCT: 10 %
HCT: 37.3 % — ABNORMAL LOW (ref 39.0–52.0)
Hemoglobin: 12.4 g/dL — ABNORMAL LOW (ref 13.0–17.0)
LYMPHS ABS: 1.1 10*3/uL (ref 0.7–4.0)
Lymphocytes Relative: 30 %
MCH: 32.4 pg (ref 26.0–34.0)
MCHC: 33.2 g/dL (ref 30.0–36.0)
MCV: 97.4 fL (ref 78.0–100.0)
MONO ABS: 0.4 10*3/uL (ref 0.1–1.0)
MONOS PCT: 11 %
NEUTROS PCT: 49 %
Neutro Abs: 1.8 10*3/uL (ref 1.7–7.7)
PLATELETS: 68 10*3/uL — AB (ref 150–400)
RBC: 3.83 MIL/uL — ABNORMAL LOW (ref 4.22–5.81)
RDW: 14.7 % (ref 11.5–15.5)
WBC: 3.6 10*3/uL — ABNORMAL LOW (ref 4.0–10.5)

## 2015-07-08 LAB — COMPREHENSIVE METABOLIC PANEL
ALBUMIN: 3.9 g/dL (ref 3.5–5.0)
ALK PHOS: 38 U/L (ref 38–126)
ALT: 20 U/L (ref 17–63)
ANION GAP: 5 (ref 5–15)
AST: 23 U/L (ref 15–41)
BUN: 22 mg/dL — ABNORMAL HIGH (ref 6–20)
CALCIUM: 8.9 mg/dL (ref 8.9–10.3)
CO2: 28 mmol/L (ref 22–32)
Chloride: 105 mmol/L (ref 101–111)
Creatinine, Ser: 0.99 mg/dL (ref 0.61–1.24)
GFR calc non Af Amer: >60 mL/min (ref >60)
GLUCOSE: 124 mg/dL — AB (ref 65–99)
POTASSIUM: 4.5 mmol/L (ref 3.5–5.1)
SODIUM: 138 mmol/L (ref 135–145)
Total Bilirubin: 1.4 mg/dL — ABNORMAL HIGH (ref 0.3–1.2)
Total Protein: 8.1 g/dL (ref 6.5–8.1)

## 2015-07-08 LAB — SEDIMENTATION RATE: Sed Rate: 32 mm/hr — ABNORMAL HIGH (ref 0–16)

## 2015-07-08 LAB — LACTATE DEHYDROGENASE: LDH: 193 U/L — AB (ref 98–192)

## 2015-07-08 NOTE — Patient Instructions (Addendum)
[place] at [facility] Discharge Instructions  RECOMMENDATIONS MADE BY THE CONSULTANT AND ANY TEST RESULTS WILL BE SENT TO YOUR REFERRING PHYSICIAN.  Exam done and seen by [facility] are stable. Return for labs in 6 weeks, in 6 months. Follow up with PA  in 68months.    Thank you for choosing [place] at [facility] to provide your oncology and hematology care.  To afford each patient quality time with our provider, please arrive at least 15 minutes before your scheduled appointment time.   Beginning January 23rd 2017 lab work for the [facility] will be done in the  Main lab at [facility] on 1st floor. If you have a lab appointment with the [facility] please come in thru the  Main Entrance and check in at the main information desk  You need to re-schedule your appointment should you arrive 10 or more minutes late.  We strive to give you quality time with our providers, and arriving late affects you and other patients whose appointments are after yours.  Also, if you no show three or more times for appointments you may be dismissed from the clinic at the providers discretion.     Again, thank you for choosing [facility].  Our hope is that these requests will decrease the amount of time that you wait before being seen by our physicians.       _____________________________________________________________  Should you have questions after your visit to [facility], please contact our office at [phone] between the hours of 8:30 a.m. and 4:30 p.m.  Voicemails left after 4:30 p.m. will not be returned until the following business day.  For prescription refill requests, have your pharmacy contact our office.         Resources For Cancer Patients and their Caregivers ? American Cancer Society: Can assist with transportation, wigs, general needs, runs Look Good Feel Better.         [phone] ? Cancer Care: Provides financial assistance, online support groups, medication/co-pay assistance.  1-800-813-HOPE 2263875596) ? [place] Assists [place] cancer patients and their families through emotional , educational and financial support.  [phone] ? [place] DSS Where to apply for food stamps, Medicaid and utility assistance. 646-833-7508 ? RCATS: Transportation to medical appointments. 510-391-5449 ? Social Security Administration: May apply for disability if have a Stage IV cancer. [phone] ? [employer], Disability and Transit Services: Assists with nutrition, care and transit needs. [phone]

## 2015-07-08 NOTE — Addendum Note (Signed)
Addended by: Elenor Legato on: 07/08/2015 03:46 PM   Modules accepted: Orders

## 2015-08-13 DIAGNOSIS — I4891 Unspecified atrial fibrillation: Secondary | ICD-10-CM | POA: Diagnosis not present

## 2015-08-13 DIAGNOSIS — I4892 Unspecified atrial flutter: Secondary | ICD-10-CM | POA: Diagnosis not present

## 2015-08-13 DIAGNOSIS — I10 Essential (primary) hypertension: Secondary | ICD-10-CM | POA: Diagnosis not present

## 2015-08-13 DIAGNOSIS — R609 Edema, unspecified: Secondary | ICD-10-CM | POA: Diagnosis not present

## 2015-08-19 ENCOUNTER — Encounter (HOSPITAL_COMMUNITY): Payer: Medicare Other | Attending: Hematology & Oncology

## 2015-08-19 DIAGNOSIS — Z8673 Personal history of transient ischemic attack (TIA), and cerebral infarction without residual deficits: Secondary | ICD-10-CM | POA: Diagnosis not present

## 2015-08-19 DIAGNOSIS — I4891 Unspecified atrial fibrillation: Secondary | ICD-10-CM | POA: Insufficient documentation

## 2015-08-19 DIAGNOSIS — E039 Hypothyroidism, unspecified: Secondary | ICD-10-CM | POA: Diagnosis not present

## 2015-08-19 DIAGNOSIS — D61818 Other pancytopenia: Secondary | ICD-10-CM

## 2015-08-19 DIAGNOSIS — I10 Essential (primary) hypertension: Secondary | ICD-10-CM | POA: Diagnosis not present

## 2015-08-19 DIAGNOSIS — Z9889 Other specified postprocedural states: Secondary | ICD-10-CM | POA: Diagnosis not present

## 2015-08-19 DIAGNOSIS — D696 Thrombocytopenia, unspecified: Secondary | ICD-10-CM | POA: Diagnosis not present

## 2015-08-19 DIAGNOSIS — R17 Unspecified jaundice: Secondary | ICD-10-CM | POA: Insufficient documentation

## 2015-08-19 LAB — CBC WITH DIFFERENTIAL/PLATELET
BASOS PCT: 1 %
Basophils Absolute: 0 10*3/uL (ref 0.0–0.1)
EOS PCT: 12 %
Eosinophils Absolute: 0.4 10*3/uL (ref 0.0–0.7)
HCT: 36.1 % — ABNORMAL LOW (ref 39.0–52.0)
HEMOGLOBIN: 12 g/dL — AB (ref 13.0–17.0)
Lymphocytes Relative: 28 %
Lymphs Abs: 0.9 10*3/uL (ref 0.7–4.0)
MCH: 32.3 pg (ref 26.0–34.0)
MCHC: 33.2 g/dL (ref 30.0–36.0)
MCV: 97.3 fL (ref 78.0–100.0)
MONO ABS: 0.6 10*3/uL (ref 0.1–1.0)
Monocytes Relative: 19 %
Neutro Abs: 1.3 10*3/uL — ABNORMAL LOW (ref 1.7–7.7)
Neutrophils Relative %: 40 %
PLATELETS: 68 10*3/uL — AB (ref 150–400)
RBC: 3.71 MIL/uL — AB (ref 4.22–5.81)
RDW: 14.5 % (ref 11.5–15.5)
WBC: 3.3 10*3/uL — ABNORMAL LOW (ref 4.0–10.5)

## 2015-08-19 LAB — COMPREHENSIVE METABOLIC PANEL
ALBUMIN: 3.7 g/dL (ref 3.5–5.0)
ALK PHOS: 36 U/L — AB (ref 38–126)
ALT: 19 U/L (ref 17–63)
ANION GAP: 5 (ref 5–15)
AST: 19 U/L (ref 15–41)
BUN: 20 mg/dL (ref 6–20)
CALCIUM: 8.8 mg/dL — AB (ref 8.9–10.3)
CHLORIDE: 105 mmol/L (ref 101–111)
CO2: 27 mmol/L (ref 22–32)
Creatinine, Ser: 0.88 mg/dL (ref 0.61–1.24)
GFR calc Af Amer: >60 mL/min (ref >60)
GFR calc non Af Amer: >60 mL/min (ref >60)
GLUCOSE: 130 mg/dL — AB (ref 65–99)
Potassium: 4.2 mmol/L (ref 3.5–5.1)
SODIUM: 137 mmol/L (ref 135–145)
Total Bilirubin: 1 mg/dL (ref 0.3–1.2)
Total Protein: 8.1 g/dL (ref 6.5–8.1)

## 2015-08-20 DIAGNOSIS — R600 Localized edema: Secondary | ICD-10-CM | POA: Diagnosis not present

## 2015-08-20 DIAGNOSIS — I480 Paroxysmal atrial fibrillation: Secondary | ICD-10-CM | POA: Diagnosis not present

## 2015-08-20 DIAGNOSIS — I10 Essential (primary) hypertension: Secondary | ICD-10-CM | POA: Diagnosis not present

## 2015-09-12 DIAGNOSIS — I482 Chronic atrial fibrillation: Secondary | ICD-10-CM | POA: Diagnosis not present

## 2015-09-22 DIAGNOSIS — I872 Venous insufficiency (chronic) (peripheral): Secondary | ICD-10-CM | POA: Diagnosis not present

## 2015-10-16 DIAGNOSIS — I482 Chronic atrial fibrillation: Secondary | ICD-10-CM | POA: Diagnosis not present

## 2015-11-13 DIAGNOSIS — I4891 Unspecified atrial fibrillation: Secondary | ICD-10-CM | POA: Diagnosis not present

## 2015-11-26 DIAGNOSIS — E039 Hypothyroidism, unspecified: Secondary | ICD-10-CM | POA: Diagnosis not present

## 2015-11-26 DIAGNOSIS — I480 Paroxysmal atrial fibrillation: Secondary | ICD-10-CM | POA: Diagnosis not present

## 2015-11-26 DIAGNOSIS — I10 Essential (primary) hypertension: Secondary | ICD-10-CM | POA: Diagnosis not present

## 2015-12-15 DIAGNOSIS — I4891 Unspecified atrial fibrillation: Secondary | ICD-10-CM | POA: Diagnosis not present

## 2015-12-31 DIAGNOSIS — I10 Essential (primary) hypertension: Secondary | ICD-10-CM | POA: Diagnosis not present

## 2015-12-31 DIAGNOSIS — E039 Hypothyroidism, unspecified: Secondary | ICD-10-CM | POA: Diagnosis not present

## 2015-12-31 DIAGNOSIS — I4891 Unspecified atrial fibrillation: Secondary | ICD-10-CM | POA: Diagnosis not present

## 2015-12-31 DIAGNOSIS — I872 Venous insufficiency (chronic) (peripheral): Secondary | ICD-10-CM | POA: Diagnosis not present

## 2016-01-08 ENCOUNTER — Encounter (HOSPITAL_COMMUNITY): Payer: Self-pay | Admitting: Oncology

## 2016-01-08 ENCOUNTER — Encounter (HOSPITAL_COMMUNITY): Payer: Medicare Other | Attending: Oncology | Admitting: Oncology

## 2016-01-08 ENCOUNTER — Encounter (HOSPITAL_COMMUNITY): Payer: Medicare Other

## 2016-01-08 VITALS — BP 115/76 | HR 77 | Temp 97.5°F | Resp 18 | Wt 261.9 lb

## 2016-01-08 DIAGNOSIS — E039 Hypothyroidism, unspecified: Secondary | ICD-10-CM | POA: Diagnosis not present

## 2016-01-08 DIAGNOSIS — Z8673 Personal history of transient ischemic attack (TIA), and cerebral infarction without residual deficits: Secondary | ICD-10-CM | POA: Diagnosis not present

## 2016-01-08 DIAGNOSIS — D61818 Other pancytopenia: Secondary | ICD-10-CM | POA: Diagnosis not present

## 2016-01-08 DIAGNOSIS — I10 Essential (primary) hypertension: Secondary | ICD-10-CM | POA: Insufficient documentation

## 2016-01-08 DIAGNOSIS — I4891 Unspecified atrial fibrillation: Secondary | ICD-10-CM | POA: Diagnosis not present

## 2016-01-08 DIAGNOSIS — Z23 Encounter for immunization: Secondary | ICD-10-CM

## 2016-01-08 DIAGNOSIS — Z0000 Encounter for general adult medical examination without abnormal findings: Secondary | ICD-10-CM

## 2016-01-08 LAB — CBC WITH DIFFERENTIAL/PLATELET
Basophils Absolute: 0 10*3/uL (ref 0.0–0.1)
Basophils Relative: 1 %
EOS PCT: 9 %
Eosinophils Absolute: 0.3 10*3/uL (ref 0.0–0.7)
HCT: 35.1 % — ABNORMAL LOW (ref 39.0–52.0)
Hemoglobin: 11.4 g/dL — ABNORMAL LOW (ref 13.0–17.0)
LYMPHS ABS: 1.1 10*3/uL (ref 0.7–4.0)
LYMPHS PCT: 28 %
MCH: 31.8 pg (ref 26.0–34.0)
MCHC: 32.5 g/dL (ref 30.0–36.0)
MCV: 98 fL (ref 78.0–100.0)
MONO ABS: 0.6 10*3/uL (ref 0.1–1.0)
Monocytes Relative: 15 %
Neutro Abs: 1.8 10*3/uL (ref 1.7–7.7)
Neutrophils Relative %: 47 %
PLATELETS: 69 10*3/uL — AB (ref 150–400)
RBC: 3.58 MIL/uL — AB (ref 4.22–5.81)
RDW: 14.9 % (ref 11.5–15.5)
WBC: 3.7 10*3/uL — AB (ref 4.0–10.5)

## 2016-01-08 LAB — COMPREHENSIVE METABOLIC PANEL
ALT: 17 U/L (ref 17–63)
AST: 18 U/L (ref 15–41)
Albumin: 3.7 g/dL (ref 3.5–5.0)
Alkaline Phosphatase: 31 U/L — ABNORMAL LOW (ref 38–126)
Anion gap: 4 — ABNORMAL LOW (ref 5–15)
BUN: 21 mg/dL — ABNORMAL HIGH (ref 6–20)
CALCIUM: 8.4 mg/dL — AB (ref 8.9–10.3)
CHLORIDE: 106 mmol/L (ref 101–111)
CO2: 26 mmol/L (ref 22–32)
CREATININE: 0.89 mg/dL (ref 0.61–1.24)
Glucose, Bld: 82 mg/dL (ref 65–99)
Potassium: 4.2 mmol/L (ref 3.5–5.1)
Sodium: 136 mmol/L (ref 135–145)
TOTAL PROTEIN: 8.1 g/dL (ref 6.5–8.1)
Total Bilirubin: 1.4 mg/dL — ABNORMAL HIGH (ref 0.3–1.2)

## 2016-01-08 LAB — SEDIMENTATION RATE: SED RATE: 63 mm/h — AB (ref 0–16)

## 2016-01-08 LAB — LACTATE DEHYDROGENASE: LDH: 176 U/L (ref 98–192)

## 2016-01-08 MED ORDER — INFLUENZA VAC SPLIT QUAD 0.5 ML IM SUSY
0.5000 mL | PREFILLED_SYRINGE | Freq: Once | INTRAMUSCULAR | Status: AC
  Administered 2016-01-08: 0.5 mL via INTRAMUSCULAR
  Filled 2016-01-08: qty 0.5

## 2016-01-08 NOTE — Assessment & Plan Note (Signed)
Pancytopenia stable x years with more recent labs showing intermittent anemia.  Bone marrow aspiration and biopsy was negative on 11/25/2008 without any abnormal cytogentics.    Labs today: CBC diff, CMET, LDH, ESR.  I personally reviewed and went over laboratory results with the patient.  The results are noted within this dictation.  Labs in 6 months: CBC diff, CMET, LDH, ESR  If significant changes in laboratory work is documented, I would not hesitate to pursue a repeat bone marrow aspiration and biopsy.  He will be due for repeat colonoscopy in 2027.  Return in 6 months for follow-up.  Sooner if necessary.

## 2016-01-08 NOTE — Progress Notes (Signed)
[name], MD [address] [place] [address]  Preventative health care - Plan: Influenza vac split quadrivalent PF (FLUARIX) injection 0.5 mL  Pancytopenia ([place])  CURRENT THERAPY: Observation  INTERVAL HISTORY: [place] 55 y.o. male returns for followup of  pancytopenia with negative bone marrow aspiration and biopsy on 11/25/2008.  On chart review, the patient had a leukopenia and thrombocytopenia in 2009. He started having anemia in 2010.       Patient is doing well.  He denies any B symptoms. Weight is stable.  Appetite is good.  He notes 1 sickness in the past 6 months.  He did not require any antibiotics.  He denies any issues/complaints today.  Review of Systems  Constitutional: Negative.  Negative for chills, fever and weight loss.  HENT: Negative.   Eyes: Negative.   Respiratory: Negative.  Negative for cough.   Cardiovascular: Negative.  Negative for chest pain.  Gastrointestinal: Negative.  Negative for constipation, diarrhea, nausea and vomiting.  Genitourinary: Negative.  Negative for dysuria.  Musculoskeletal: Negative.  Negative for falls.  Skin: Negative.  Negative for rash.  Neurological: Negative.  Negative for weakness.  Endo/Heme/Allergies: Negative.  Does not bruise/bleed easily.  Psychiatric/Behavioral: Negative.     Past Medical History:  Diagnosis Date  . Atrial fibrillation ([place])   . CVA (cerebral vascular accident) ([place])   . Hypertension   . Hypothyroidism   . Pancytopenia ([place]) 01/11/2012   Stable  . Thrombocytopenia ([place])     Past Surgical History:  Procedure Laterality Date  . COLONOSCOPY N/A 04/22/2015   Procedure: COLONOSCOPY;  Surgeon: Daneil Dolin, MD;  Location: [facility];  Service: Endoscopy;  Laterality: N/A;  1245  . HERNIA REPAIR     as child    Family History  Problem Relation Age of Onset  . Cancer Mother   . Colon cancer      unknown but THINKS his mom may have had    Social  History   Social History  . Marital status: Single    Spouse name: N/A  . Number of children: N/A  . Years of education: N/A   Social History Main Topics  . Smoking status: Never Smoker  . Smokeless tobacco: Never Used  . Alcohol use No  . Drug use: No  . Sexual activity: Not Asked   Other Topics Concern  . None   Social History Narrative  . None     PHYSICAL EXAMINATION  ECOG PERFORMANCE STATUS: 1 - Symptomatic but completely ambulatory  Vitals:   01/08/16 1000  BP: 115/76  Pulse: 77  Resp: 18  Temp: 97.5 F (36.4 C)    GENERAL:alert, no distress, well nourished, well developed, comfortable, cooperative, obese, smiling and accompanied by nursing aid SKIN: skin color, texture, turgor are normal, no rashes or significant lesions HEAD: Normocephalic, No masses, lesions, tenderness or abnormalities EYES: normal, EOMI, Conjunctiva are pink and non-injected EARS: External ears normal OROPHARYNX:lips, buccal mucosa, and tongue normal and mucous membranes are moist  NECK: supple, no adenopathy, trachea midline LYMPH:  no palpable lymphadenopathy BREAST:not examined LUNGS: clear to auscultation and percussion HEART: regular rate & rhythm, no murmurs, no gallops, S1 normal and S2 normal ABDOMEN:abdomen soft, non-tender, obese and normal bowel sounds BACK: Back symmetric, no curvature. EXTREMITIES:less then 2 second capillary refill, no joint deformities, effusion, or inflammation, no skin discoloration, no cyanosis  NEURO: alert & oriented x 3 with fluent speech, no focal motor/sensory deficits,  gait normal   LABORATORY DATA: CBC    Component Value Date/Time   WBC 3.7 (L) 01/08/2016 0957   RBC 3.58 (L) 01/08/2016 0957   HGB 11.4 (L) 01/08/2016 0957   HGB 13.1 02/18/2012 1619   HCT 35.1 (L) 01/08/2016 0957   HCT 38.7 02/18/2012 1619   PLT 69 (L) 01/08/2016 0957   PLT 64 (L) 02/18/2012 1619   MCV 98.0 01/08/2016 0957   MCV 96.4 02/18/2012 1619   MCH 31.8  01/08/2016 0957   MCHC 32.5 01/08/2016 0957   RDW 14.9 01/08/2016 0957   RDW 15.1 (H) 02/18/2012 1619   LYMPHSABS 1.1 01/08/2016 0957   LYMPHSABS 0.8 (L) 02/18/2012 1619   MONOABS 0.6 01/08/2016 0957   MONOABS 0.4 02/18/2012 1619   EOSABS 0.3 01/08/2016 0957   EOSABS 0.1 02/18/2012 1619   BASOSABS 0.0 01/08/2016 0957   BASOSABS 0.0 02/18/2012 1619      Chemistry      Component Value Date/Time   NA 136 01/08/2016 0957   K 4.2 01/08/2016 0957   CL 106 01/08/2016 0957   CO2 26 01/08/2016 0957   BUN 21 (H) 01/08/2016 0957   CREATININE 0.89 01/08/2016 0957      Component Value Date/Time   CALCIUM 8.4 (L) 01/08/2016 0957   ALKPHOS 31 (L) 01/08/2016 0957   AST 18 01/08/2016 0957   ALT 17 01/08/2016 0957   BILITOT 1.4 (H) 01/08/2016 0957        PENDING LABS:   RADIOGRAPHIC STUDIES:  No results found.   PATHOLOGY:    ASSESSMENT AND PLAN:  Pancytopenia ([place]) Pancytopenia stable x years with more recent labs showing intermittent anemia.  Bone marrow aspiration and biopsy was negative on 11/25/2008 without any abnormal cytogentics.    Labs today: CBC diff, CMET, LDH, ESR.  I personally reviewed and went over laboratory results with the patient.  The results are noted within this dictation.  Labs in 6 months: CBC diff, CMET, LDH, ESR  If significant changes in laboratory work is documented, I would not hesitate to pursue a repeat bone marrow aspiration and biopsy.  He will be due for repeat colonoscopy in 2027.  Return in 6 months for follow-up.  Sooner if necessary.   ORDERS PLACED FOR THIS ENCOUNTER: No orders of the defined types were placed in this encounter.   MEDICATIONS PRESCRIBED THIS ENCOUNTER: Meds ordered this encounter  Medications  . Influenza vac split quadrivalent PF (FLUARIX) injection 0.5 mL  . levothyroxine (SYNTHROID, LEVOTHROID) 125 MCG tablet    THERAPY PLAN:  Will continue to monitor counts.  All questions were answered. The patient  knows to call the clinic with any problems, questions or concerns. We can certainly see the patient much sooner if necessary.  Patient and plan discussed with Dr. Ancil Linsey and she is in agreement with the aforementioned.   This note is electronically signed by: Doy Mince 01/08/2016 6:48 PM

## 2016-01-08 NOTE — Progress Notes (Signed)
Pt given flu vaccine in left deltoid with no complications. Pt verbalized understanding of vaccine handout.

## 2016-01-08 NOTE — Patient Instructions (Addendum)
[place] at [facility] Discharge Instructions  RECOMMENDATIONS MADE BY THE CONSULTANT AND ANY TEST RESULTS WILL BE SENT TO YOUR REFERRING PHYSICIAN.  You were seen by Gershon Mussel Flu shot today Return in 6 months with labs   Thank you for choosing [place] at [facility] to provide your oncology and hematology care.  To afford each patient quality time with our provider, please arrive at least 15 minutes before your scheduled appointment time.   Beginning January 23rd 2017 lab work for the [facility] will be done in the  Main lab at [facility] on 1st floor. If you have a lab appointment with the [facility] please come in thru the  Main Entrance and check in at the main information desk  You need to re-schedule your appointment should you arrive 10 or more minutes late.  We strive to give you quality time with our providers, and arriving late affects you and other patients whose appointments are after yours.  Also, if you no show three or more times for appointments you may be dismissed from the clinic at the providers discretion.     Again, thank you for choosing [facility].  Our hope is that these requests will decrease the amount of time that you wait before being seen by our physicians.       _____________________________________________________________  Should you have questions after your visit to [facility], please contact our office at [phone] between the hours of 8:30 a.m. and 4:30 p.m.  Voicemails left after 4:30 p.m. will not be returned until the following business day.  For prescription refill requests, have your pharmacy contact our office.         Resources For Cancer Patients and their Caregivers ? American Cancer Society: Can assist with transportation, wigs, general needs, runs Look Good Feel Better.        [phone] ? Cancer Care: Provides financial assistance, online support  groups, medication/co-pay assistance.  1-800-813-HOPE 830-466-7257) ? [place] Assists [place] cancer patients and their families through emotional , educational and financial support.  [phone] ? [place] DSS Where to apply for food stamps, Medicaid and utility assistance. 226-424-7334 ? RCATS: Transportation to medical appointments. 8542370170 ? Social Security Administration: May apply for disability if have a Stage IV cancer. [phone] ? [employer], Disability and Transit Services: Assists with nutrition, care and transit needs. [place] Support Programs: @10RELATIVEDAYS @ > Cancer Support Group  2nd Tuesday of the month 1pm-2pm, Journey Room  > Creative Journey  3rd Tuesday of the month 1130am-1pm, Journey Room  > Look Good Feel Better  1st Wednesday of the month 10am-12 noon, Journey Room (Call [place] to register [phone])

## 2016-01-15 DIAGNOSIS — I4891 Unspecified atrial fibrillation: Secondary | ICD-10-CM | POA: Diagnosis not present

## 2016-02-11 DIAGNOSIS — I4891 Unspecified atrial fibrillation: Secondary | ICD-10-CM | POA: Diagnosis not present

## 2016-03-15 DIAGNOSIS — R0602 Shortness of breath: Secondary | ICD-10-CM | POA: Diagnosis not present

## 2016-03-15 DIAGNOSIS — I10 Essential (primary) hypertension: Secondary | ICD-10-CM | POA: Diagnosis not present

## 2016-03-15 DIAGNOSIS — R05 Cough: Secondary | ICD-10-CM | POA: Diagnosis not present

## 2016-03-15 DIAGNOSIS — I4891 Unspecified atrial fibrillation: Secondary | ICD-10-CM | POA: Diagnosis not present

## 2016-03-15 DIAGNOSIS — I872 Venous insufficiency (chronic) (peripheral): Secondary | ICD-10-CM | POA: Diagnosis not present

## 2016-03-15 DIAGNOSIS — I480 Paroxysmal atrial fibrillation: Secondary | ICD-10-CM | POA: Diagnosis not present

## 2016-03-15 DIAGNOSIS — E039 Hypothyroidism, unspecified: Secondary | ICD-10-CM | POA: Diagnosis not present

## 2016-03-16 DIAGNOSIS — I10 Essential (primary) hypertension: Secondary | ICD-10-CM | POA: Diagnosis not present

## 2016-03-16 DIAGNOSIS — D61818 Other pancytopenia: Secondary | ICD-10-CM | POA: Diagnosis not present

## 2016-03-16 DIAGNOSIS — I480 Paroxysmal atrial fibrillation: Secondary | ICD-10-CM | POA: Diagnosis not present

## 2016-03-17 DIAGNOSIS — I872 Venous insufficiency (chronic) (peripheral): Secondary | ICD-10-CM | POA: Diagnosis not present

## 2016-03-17 DIAGNOSIS — Z954 Presence of other heart-valve replacement: Secondary | ICD-10-CM | POA: Diagnosis not present

## 2016-03-17 DIAGNOSIS — I482 Chronic atrial fibrillation: Secondary | ICD-10-CM | POA: Diagnosis not present

## 2016-03-17 DIAGNOSIS — I83891 Varicose veins of right lower extremities with other complications: Secondary | ICD-10-CM | POA: Diagnosis not present

## 2016-03-17 DIAGNOSIS — I10 Essential (primary) hypertension: Secondary | ICD-10-CM | POA: Diagnosis not present

## 2016-03-17 DIAGNOSIS — Z7901 Long term (current) use of anticoagulants: Secondary | ICD-10-CM | POA: Diagnosis not present

## 2016-03-17 DIAGNOSIS — Z79899 Other long term (current) drug therapy: Secondary | ICD-10-CM | POA: Diagnosis not present

## 2016-03-17 DIAGNOSIS — E039 Hypothyroidism, unspecified: Secondary | ICD-10-CM | POA: Diagnosis not present

## 2016-03-22 DIAGNOSIS — I872 Venous insufficiency (chronic) (peripheral): Secondary | ICD-10-CM | POA: Diagnosis not present

## 2016-03-22 DIAGNOSIS — I10 Essential (primary) hypertension: Secondary | ICD-10-CM | POA: Diagnosis not present

## 2016-03-22 DIAGNOSIS — E039 Hypothyroidism, unspecified: Secondary | ICD-10-CM | POA: Diagnosis not present

## 2016-03-22 DIAGNOSIS — I482 Chronic atrial fibrillation: Secondary | ICD-10-CM | POA: Diagnosis not present

## 2016-03-22 DIAGNOSIS — Z7901 Long term (current) use of anticoagulants: Secondary | ICD-10-CM | POA: Diagnosis not present

## 2016-03-22 DIAGNOSIS — Z79899 Other long term (current) drug therapy: Secondary | ICD-10-CM | POA: Diagnosis not present

## 2016-03-31 DIAGNOSIS — Z7901 Long term (current) use of anticoagulants: Secondary | ICD-10-CM | POA: Diagnosis not present

## 2016-03-31 DIAGNOSIS — I872 Venous insufficiency (chronic) (peripheral): Secondary | ICD-10-CM | POA: Diagnosis not present

## 2016-03-31 DIAGNOSIS — E039 Hypothyroidism, unspecified: Secondary | ICD-10-CM | POA: Diagnosis not present

## 2016-03-31 DIAGNOSIS — I83892 Varicose veins of left lower extremities with other complications: Secondary | ICD-10-CM | POA: Diagnosis not present

## 2016-03-31 DIAGNOSIS — I482 Chronic atrial fibrillation: Secondary | ICD-10-CM | POA: Diagnosis not present

## 2016-03-31 DIAGNOSIS — I10 Essential (primary) hypertension: Secondary | ICD-10-CM | POA: Diagnosis not present

## 2016-03-31 DIAGNOSIS — Z79899 Other long term (current) drug therapy: Secondary | ICD-10-CM | POA: Diagnosis not present

## 2016-04-05 DIAGNOSIS — I10 Essential (primary) hypertension: Secondary | ICD-10-CM | POA: Diagnosis not present

## 2016-04-05 DIAGNOSIS — I872 Venous insufficiency (chronic) (peripheral): Secondary | ICD-10-CM | POA: Diagnosis not present

## 2016-04-05 DIAGNOSIS — E039 Hypothyroidism, unspecified: Secondary | ICD-10-CM | POA: Diagnosis not present

## 2016-04-05 DIAGNOSIS — Z7901 Long term (current) use of anticoagulants: Secondary | ICD-10-CM | POA: Diagnosis not present

## 2016-04-05 DIAGNOSIS — I482 Chronic atrial fibrillation: Secondary | ICD-10-CM | POA: Diagnosis not present

## 2016-04-05 DIAGNOSIS — Z79899 Other long term (current) drug therapy: Secondary | ICD-10-CM | POA: Diagnosis not present

## 2016-04-14 DIAGNOSIS — I872 Venous insufficiency (chronic) (peripheral): Secondary | ICD-10-CM | POA: Diagnosis not present

## 2016-04-14 DIAGNOSIS — I10 Essential (primary) hypertension: Secondary | ICD-10-CM | POA: Diagnosis not present

## 2016-04-14 DIAGNOSIS — I480 Paroxysmal atrial fibrillation: Secondary | ICD-10-CM | POA: Diagnosis not present

## 2016-04-14 DIAGNOSIS — E039 Hypothyroidism, unspecified: Secondary | ICD-10-CM | POA: Diagnosis not present

## 2016-04-27 DIAGNOSIS — Z6838 Body mass index (BMI) 38.0-38.9, adult: Secondary | ICD-10-CM | POA: Diagnosis not present

## 2016-04-27 DIAGNOSIS — E039 Hypothyroidism, unspecified: Secondary | ICD-10-CM | POA: Diagnosis not present

## 2016-04-27 DIAGNOSIS — I872 Venous insufficiency (chronic) (peripheral): Secondary | ICD-10-CM | POA: Diagnosis not present

## 2016-04-27 DIAGNOSIS — Z952 Presence of prosthetic heart valve: Secondary | ICD-10-CM | POA: Diagnosis not present

## 2016-04-27 DIAGNOSIS — I10 Essential (primary) hypertension: Secondary | ICD-10-CM | POA: Diagnosis not present

## 2016-04-27 DIAGNOSIS — I482 Chronic atrial fibrillation: Secondary | ICD-10-CM | POA: Diagnosis not present

## 2016-04-27 DIAGNOSIS — E669 Obesity, unspecified: Secondary | ICD-10-CM | POA: Diagnosis not present

## 2016-04-27 DIAGNOSIS — Z7901 Long term (current) use of anticoagulants: Secondary | ICD-10-CM | POA: Diagnosis not present

## 2016-05-10 DIAGNOSIS — Z7901 Long term (current) use of anticoagulants: Secondary | ICD-10-CM | POA: Diagnosis not present

## 2016-05-10 DIAGNOSIS — E039 Hypothyroidism, unspecified: Secondary | ICD-10-CM | POA: Diagnosis not present

## 2016-05-10 DIAGNOSIS — I872 Venous insufficiency (chronic) (peripheral): Secondary | ICD-10-CM | POA: Diagnosis not present

## 2016-05-10 DIAGNOSIS — E669 Obesity, unspecified: Secondary | ICD-10-CM | POA: Diagnosis not present

## 2016-05-10 DIAGNOSIS — I10 Essential (primary) hypertension: Secondary | ICD-10-CM | POA: Diagnosis not present

## 2016-05-10 DIAGNOSIS — I482 Chronic atrial fibrillation: Secondary | ICD-10-CM | POA: Diagnosis not present

## 2016-05-10 DIAGNOSIS — I83891 Varicose veins of right lower extremities with other complications: Secondary | ICD-10-CM | POA: Diagnosis not present

## 2016-05-14 DIAGNOSIS — Z952 Presence of prosthetic heart valve: Secondary | ICD-10-CM | POA: Diagnosis not present

## 2016-05-14 DIAGNOSIS — Z7901 Long term (current) use of anticoagulants: Secondary | ICD-10-CM | POA: Diagnosis not present

## 2016-05-14 DIAGNOSIS — Z6838 Body mass index (BMI) 38.0-38.9, adult: Secondary | ICD-10-CM | POA: Diagnosis not present

## 2016-05-14 DIAGNOSIS — E039 Hypothyroidism, unspecified: Secondary | ICD-10-CM | POA: Diagnosis not present

## 2016-05-14 DIAGNOSIS — I482 Chronic atrial fibrillation: Secondary | ICD-10-CM | POA: Diagnosis not present

## 2016-05-14 DIAGNOSIS — E669 Obesity, unspecified: Secondary | ICD-10-CM | POA: Diagnosis not present

## 2016-05-14 DIAGNOSIS — I872 Venous insufficiency (chronic) (peripheral): Secondary | ICD-10-CM | POA: Diagnosis not present

## 2016-05-14 DIAGNOSIS — I10 Essential (primary) hypertension: Secondary | ICD-10-CM | POA: Diagnosis not present

## 2016-05-25 DIAGNOSIS — Z7901 Long term (current) use of anticoagulants: Secondary | ICD-10-CM | POA: Diagnosis not present

## 2016-05-25 DIAGNOSIS — E039 Hypothyroidism, unspecified: Secondary | ICD-10-CM | POA: Diagnosis not present

## 2016-05-25 DIAGNOSIS — I83892 Varicose veins of left lower extremities with other complications: Secondary | ICD-10-CM | POA: Diagnosis not present

## 2016-05-25 DIAGNOSIS — Z952 Presence of prosthetic heart valve: Secondary | ICD-10-CM | POA: Diagnosis not present

## 2016-05-25 DIAGNOSIS — I872 Venous insufficiency (chronic) (peripheral): Secondary | ICD-10-CM | POA: Diagnosis not present

## 2016-05-25 DIAGNOSIS — I482 Chronic atrial fibrillation: Secondary | ICD-10-CM | POA: Diagnosis not present

## 2016-05-25 DIAGNOSIS — I10 Essential (primary) hypertension: Secondary | ICD-10-CM | POA: Diagnosis not present

## 2016-05-28 DIAGNOSIS — I10 Essential (primary) hypertension: Secondary | ICD-10-CM | POA: Diagnosis not present

## 2016-05-28 DIAGNOSIS — Z952 Presence of prosthetic heart valve: Secondary | ICD-10-CM | POA: Diagnosis not present

## 2016-05-28 DIAGNOSIS — I872 Venous insufficiency (chronic) (peripheral): Secondary | ICD-10-CM | POA: Diagnosis not present

## 2016-05-28 DIAGNOSIS — Z7901 Long term (current) use of anticoagulants: Secondary | ICD-10-CM | POA: Diagnosis not present

## 2016-05-28 DIAGNOSIS — E039 Hypothyroidism, unspecified: Secondary | ICD-10-CM | POA: Diagnosis not present

## 2016-05-28 DIAGNOSIS — I482 Chronic atrial fibrillation: Secondary | ICD-10-CM | POA: Diagnosis not present

## 2016-06-09 DIAGNOSIS — I482 Chronic atrial fibrillation: Secondary | ICD-10-CM | POA: Diagnosis not present

## 2016-06-09 DIAGNOSIS — I10 Essential (primary) hypertension: Secondary | ICD-10-CM | POA: Diagnosis not present

## 2016-06-09 DIAGNOSIS — I872 Venous insufficiency (chronic) (peripheral): Secondary | ICD-10-CM | POA: Diagnosis not present

## 2016-06-09 DIAGNOSIS — E039 Hypothyroidism, unspecified: Secondary | ICD-10-CM | POA: Diagnosis not present

## 2016-07-06 ENCOUNTER — Other Ambulatory Visit (HOSPITAL_COMMUNITY): Payer: Self-pay | Admitting: *Deleted

## 2016-07-06 DIAGNOSIS — D61818 Other pancytopenia: Secondary | ICD-10-CM

## 2016-07-07 ENCOUNTER — Ambulatory Visit (HOSPITAL_COMMUNITY): Payer: Medicare Other | Admitting: Hematology & Oncology

## 2016-07-07 ENCOUNTER — Ambulatory Visit (HOSPITAL_COMMUNITY): Payer: Medicare Other | Admitting: Hematology

## 2016-07-07 ENCOUNTER — Other Ambulatory Visit (HOSPITAL_COMMUNITY): Payer: Medicare Other

## 2016-07-13 DIAGNOSIS — I872 Venous insufficiency (chronic) (peripheral): Secondary | ICD-10-CM | POA: Diagnosis not present

## 2016-07-13 DIAGNOSIS — E039 Hypothyroidism, unspecified: Secondary | ICD-10-CM | POA: Diagnosis not present

## 2016-07-13 DIAGNOSIS — I482 Chronic atrial fibrillation: Secondary | ICD-10-CM | POA: Diagnosis not present

## 2016-07-13 DIAGNOSIS — I10 Essential (primary) hypertension: Secondary | ICD-10-CM | POA: Diagnosis not present

## 2016-07-14 DIAGNOSIS — I480 Paroxysmal atrial fibrillation: Secondary | ICD-10-CM | POA: Diagnosis not present

## 2016-07-14 DIAGNOSIS — E039 Hypothyroidism, unspecified: Secondary | ICD-10-CM | POA: Diagnosis not present

## 2016-07-14 DIAGNOSIS — I10 Essential (primary) hypertension: Secondary | ICD-10-CM | POA: Diagnosis not present

## 2016-08-12 DIAGNOSIS — I10 Essential (primary) hypertension: Secondary | ICD-10-CM | POA: Diagnosis not present

## 2016-08-12 DIAGNOSIS — I872 Venous insufficiency (chronic) (peripheral): Secondary | ICD-10-CM | POA: Diagnosis not present

## 2016-08-12 DIAGNOSIS — E039 Hypothyroidism, unspecified: Secondary | ICD-10-CM | POA: Diagnosis not present

## 2016-08-12 DIAGNOSIS — I482 Chronic atrial fibrillation: Secondary | ICD-10-CM | POA: Diagnosis not present

## 2016-09-06 DIAGNOSIS — I4891 Unspecified atrial fibrillation: Secondary | ICD-10-CM | POA: Diagnosis not present

## 2016-09-06 DIAGNOSIS — Z13220 Encounter for screening for lipoid disorders: Secondary | ICD-10-CM | POA: Diagnosis not present

## 2016-09-06 DIAGNOSIS — E039 Hypothyroidism, unspecified: Secondary | ICD-10-CM | POA: Diagnosis not present

## 2016-09-06 DIAGNOSIS — I872 Venous insufficiency (chronic) (peripheral): Secondary | ICD-10-CM | POA: Diagnosis not present

## 2016-09-06 DIAGNOSIS — I10 Essential (primary) hypertension: Secondary | ICD-10-CM | POA: Diagnosis not present

## 2016-09-06 DIAGNOSIS — I482 Chronic atrial fibrillation: Secondary | ICD-10-CM | POA: Diagnosis not present

## 2016-10-06 DIAGNOSIS — I480 Paroxysmal atrial fibrillation: Secondary | ICD-10-CM | POA: Diagnosis not present

## 2016-10-06 DIAGNOSIS — I10 Essential (primary) hypertension: Secondary | ICD-10-CM | POA: Diagnosis not present

## 2016-10-06 DIAGNOSIS — D61818 Other pancytopenia: Secondary | ICD-10-CM | POA: Diagnosis not present

## 2016-10-12 DIAGNOSIS — E039 Hypothyroidism, unspecified: Secondary | ICD-10-CM | POA: Diagnosis not present

## 2016-10-12 DIAGNOSIS — I10 Essential (primary) hypertension: Secondary | ICD-10-CM | POA: Diagnosis not present

## 2016-10-12 DIAGNOSIS — I482 Chronic atrial fibrillation: Secondary | ICD-10-CM | POA: Diagnosis not present

## 2016-10-12 DIAGNOSIS — Z13220 Encounter for screening for lipoid disorders: Secondary | ICD-10-CM | POA: Diagnosis not present

## 2016-10-12 DIAGNOSIS — I872 Venous insufficiency (chronic) (peripheral): Secondary | ICD-10-CM | POA: Diagnosis not present

## 2016-11-10 DIAGNOSIS — Z792 Long term (current) use of antibiotics: Secondary | ICD-10-CM | POA: Diagnosis not present

## 2016-11-10 DIAGNOSIS — I482 Chronic atrial fibrillation: Secondary | ICD-10-CM | POA: Diagnosis not present

## 2016-11-10 DIAGNOSIS — Z13220 Encounter for screening for lipoid disorders: Secondary | ICD-10-CM | POA: Diagnosis not present

## 2016-11-10 DIAGNOSIS — I872 Venous insufficiency (chronic) (peripheral): Secondary | ICD-10-CM | POA: Diagnosis not present

## 2016-11-10 DIAGNOSIS — E039 Hypothyroidism, unspecified: Secondary | ICD-10-CM | POA: Diagnosis not present

## 2016-11-10 DIAGNOSIS — I10 Essential (primary) hypertension: Secondary | ICD-10-CM | POA: Diagnosis not present

## 2016-12-09 DIAGNOSIS — E039 Hypothyroidism, unspecified: Secondary | ICD-10-CM | POA: Diagnosis not present

## 2016-12-09 DIAGNOSIS — Z792 Long term (current) use of antibiotics: Secondary | ICD-10-CM | POA: Diagnosis not present

## 2016-12-09 DIAGNOSIS — I10 Essential (primary) hypertension: Secondary | ICD-10-CM | POA: Diagnosis not present

## 2016-12-09 DIAGNOSIS — I872 Venous insufficiency (chronic) (peripheral): Secondary | ICD-10-CM | POA: Diagnosis not present

## 2016-12-09 DIAGNOSIS — Z7901 Long term (current) use of anticoagulants: Secondary | ICD-10-CM | POA: Diagnosis not present

## 2016-12-09 DIAGNOSIS — I482 Chronic atrial fibrillation: Secondary | ICD-10-CM | POA: Diagnosis not present

## 2016-12-09 DIAGNOSIS — Z13220 Encounter for screening for lipoid disorders: Secondary | ICD-10-CM | POA: Diagnosis not present

## 2017-01-07 DIAGNOSIS — I6350 Cerebral infarction due to unspecified occlusion or stenosis of unspecified cerebral artery: Secondary | ICD-10-CM | POA: Diagnosis not present

## 2017-01-07 DIAGNOSIS — E039 Hypothyroidism, unspecified: Secondary | ICD-10-CM | POA: Diagnosis not present

## 2017-01-07 DIAGNOSIS — Z23 Encounter for immunization: Secondary | ICD-10-CM | POA: Diagnosis not present

## 2017-01-07 DIAGNOSIS — I10 Essential (primary) hypertension: Secondary | ICD-10-CM | POA: Diagnosis not present

## 2017-01-10 DIAGNOSIS — Z13220 Encounter for screening for lipoid disorders: Secondary | ICD-10-CM | POA: Diagnosis not present

## 2017-01-10 DIAGNOSIS — I10 Essential (primary) hypertension: Secondary | ICD-10-CM | POA: Diagnosis not present

## 2017-01-10 DIAGNOSIS — I872 Venous insufficiency (chronic) (peripheral): Secondary | ICD-10-CM | POA: Diagnosis not present

## 2017-01-10 DIAGNOSIS — Z792 Long term (current) use of antibiotics: Secondary | ICD-10-CM | POA: Diagnosis not present

## 2017-01-10 DIAGNOSIS — Z7901 Long term (current) use of anticoagulants: Secondary | ICD-10-CM | POA: Diagnosis not present

## 2017-01-10 DIAGNOSIS — E039 Hypothyroidism, unspecified: Secondary | ICD-10-CM | POA: Diagnosis not present

## 2017-01-10 DIAGNOSIS — I482 Chronic atrial fibrillation: Secondary | ICD-10-CM | POA: Diagnosis not present

## 2017-02-09 DIAGNOSIS — I10 Essential (primary) hypertension: Secondary | ICD-10-CM | POA: Diagnosis not present

## 2017-02-09 DIAGNOSIS — E039 Hypothyroidism, unspecified: Secondary | ICD-10-CM | POA: Diagnosis not present

## 2017-02-09 DIAGNOSIS — I4891 Unspecified atrial fibrillation: Secondary | ICD-10-CM | POA: Diagnosis not present

## 2017-02-09 DIAGNOSIS — Z13220 Encounter for screening for lipoid disorders: Secondary | ICD-10-CM | POA: Diagnosis not present

## 2017-02-09 DIAGNOSIS — Z792 Long term (current) use of antibiotics: Secondary | ICD-10-CM | POA: Diagnosis not present

## 2017-02-09 DIAGNOSIS — Z7901 Long term (current) use of anticoagulants: Secondary | ICD-10-CM | POA: Diagnosis not present

## 2017-02-09 DIAGNOSIS — I872 Venous insufficiency (chronic) (peripheral): Secondary | ICD-10-CM | POA: Diagnosis not present

## 2017-03-08 DIAGNOSIS — I10 Essential (primary) hypertension: Secondary | ICD-10-CM | POA: Diagnosis not present

## 2017-03-08 DIAGNOSIS — Z13220 Encounter for screening for lipoid disorders: Secondary | ICD-10-CM | POA: Diagnosis not present

## 2017-03-08 DIAGNOSIS — I4891 Unspecified atrial fibrillation: Secondary | ICD-10-CM | POA: Diagnosis not present

## 2017-03-08 DIAGNOSIS — E039 Hypothyroidism, unspecified: Secondary | ICD-10-CM | POA: Diagnosis not present

## 2017-03-08 DIAGNOSIS — Z7901 Long term (current) use of anticoagulants: Secondary | ICD-10-CM | POA: Diagnosis not present

## 2017-03-08 DIAGNOSIS — I872 Venous insufficiency (chronic) (peripheral): Secondary | ICD-10-CM | POA: Diagnosis not present

## 2017-03-17 DIAGNOSIS — E039 Hypothyroidism, unspecified: Secondary | ICD-10-CM | POA: Diagnosis not present

## 2017-03-17 DIAGNOSIS — Z131 Encounter for screening for diabetes mellitus: Secondary | ICD-10-CM | POA: Diagnosis not present

## 2017-03-17 DIAGNOSIS — I10 Essential (primary) hypertension: Secondary | ICD-10-CM | POA: Diagnosis not present

## 2017-03-17 DIAGNOSIS — Z1389 Encounter for screening for other disorder: Secondary | ICD-10-CM | POA: Diagnosis not present

## 2017-03-17 DIAGNOSIS — R739 Hyperglycemia, unspecified: Secondary | ICD-10-CM | POA: Diagnosis not present

## 2017-03-17 DIAGNOSIS — I480 Paroxysmal atrial fibrillation: Secondary | ICD-10-CM | POA: Diagnosis not present

## 2017-03-17 DIAGNOSIS — D61818 Other pancytopenia: Secondary | ICD-10-CM | POA: Diagnosis not present

## 2017-03-17 DIAGNOSIS — R789 Finding of unspecified substance, not normally found in blood: Secondary | ICD-10-CM | POA: Diagnosis not present

## 2017-03-17 DIAGNOSIS — Z0000 Encounter for general adult medical examination without abnormal findings: Secondary | ICD-10-CM | POA: Diagnosis not present

## 2017-03-17 DIAGNOSIS — D649 Anemia, unspecified: Secondary | ICD-10-CM | POA: Diagnosis not present

## 2017-03-17 DIAGNOSIS — I6350 Cerebral infarction due to unspecified occlusion or stenosis of unspecified cerebral artery: Secondary | ICD-10-CM | POA: Diagnosis not present

## 2017-04-06 ENCOUNTER — Ambulatory Visit (INDEPENDENT_AMBULATORY_CARE_PROVIDER_SITE_OTHER): Payer: Medicare Other | Admitting: Podiatry

## 2017-04-06 DIAGNOSIS — I4891 Unspecified atrial fibrillation: Secondary | ICD-10-CM | POA: Diagnosis not present

## 2017-04-06 DIAGNOSIS — E785 Hyperlipidemia, unspecified: Secondary | ICD-10-CM | POA: Diagnosis not present

## 2017-04-06 DIAGNOSIS — L989 Disorder of the skin and subcutaneous tissue, unspecified: Secondary | ICD-10-CM | POA: Diagnosis not present

## 2017-04-06 DIAGNOSIS — Z13220 Encounter for screening for lipoid disorders: Secondary | ICD-10-CM | POA: Diagnosis not present

## 2017-04-06 DIAGNOSIS — Z7901 Long term (current) use of anticoagulants: Secondary | ICD-10-CM | POA: Diagnosis not present

## 2017-04-06 DIAGNOSIS — I10 Essential (primary) hypertension: Secondary | ICD-10-CM | POA: Diagnosis not present

## 2017-04-06 DIAGNOSIS — E039 Hypothyroidism, unspecified: Secondary | ICD-10-CM | POA: Diagnosis not present

## 2017-04-06 DIAGNOSIS — I872 Venous insufficiency (chronic) (peripheral): Secondary | ICD-10-CM | POA: Diagnosis not present

## 2017-04-08 DIAGNOSIS — I6350 Cerebral infarction due to unspecified occlusion or stenosis of unspecified cerebral artery: Secondary | ICD-10-CM | POA: Diagnosis not present

## 2017-04-08 DIAGNOSIS — E039 Hypothyroidism, unspecified: Secondary | ICD-10-CM | POA: Diagnosis not present

## 2017-04-08 DIAGNOSIS — I10 Essential (primary) hypertension: Secondary | ICD-10-CM | POA: Diagnosis not present

## 2017-04-13 NOTE — Progress Notes (Signed)
   Subjective: Patient presents to the office today for chief complaint of painful callus lesions of the feet. Patient states that the pain is ongoing and is affecting their ability to ambulate without pain. Patient presents today for further treatment and evaluation.   Past Medical History:  Diagnosis Date  . Atrial fibrillation ([place])   . CVA (cerebral vascular accident) ([place])   . Hypertension   . Hypothyroidism   . Pancytopenia ([place]) 01/11/2012   Stable  . Thrombocytopenia ([place])      Objective:  Physical Exam General: Alert and oriented x3 in no acute distress  Dermatology: Hyperkeratotic lesions present on the bilateral feet x2. Pain on palpation with a central nucleated core noted.  Skin is warm, dry and supple bilateral lower extremities. Negative for open lesions or macerations.  Vascular: Palpable pedal pulses bilaterally. No edema or erythema noted. Capillary refill within normal limits.  Neurological: Epicritic and protective threshold grossly intact bilaterally.   Musculoskeletal Exam: Pain on palpation at the keratotic lesion noted. Range of motion within normal limits bilateral. Muscle strength 5/5 in all groups bilateral.  Assessment: #1  Porokeratosis bilateral feet x2   Plan of Care:  #1 Patient evaluated #2 Excisional debridement of keratoic lesions using a chisel blade was performed without incident.  #3 Dressed area with light dressing. #4 Patient is to return to the clinic PRN.   Edrick Kins, DPM [facility]  Dr. Edrick Kins, [place]                                        [place], [place] [address]                Office [phone]  Fax [phone]

## 2017-05-04 DIAGNOSIS — I639 Cerebral infarction, unspecified: Secondary | ICD-10-CM | POA: Diagnosis not present

## 2017-05-04 DIAGNOSIS — M6282 Rhabdomyolysis: Secondary | ICD-10-CM | POA: Diagnosis not present

## 2017-05-04 DIAGNOSIS — I4891 Unspecified atrial fibrillation: Secondary | ICD-10-CM | POA: Diagnosis not present

## 2017-05-04 DIAGNOSIS — D72819 Decreased white blood cell count, unspecified: Secondary | ICD-10-CM | POA: Diagnosis not present

## 2017-05-04 DIAGNOSIS — D696 Thrombocytopenia, unspecified: Secondary | ICD-10-CM | POA: Diagnosis not present

## 2017-05-04 DIAGNOSIS — D649 Anemia, unspecified: Secondary | ICD-10-CM | POA: Diagnosis not present

## 2017-05-04 DIAGNOSIS — D61818 Other pancytopenia: Secondary | ICD-10-CM | POA: Diagnosis not present

## 2017-05-04 DIAGNOSIS — I10 Essential (primary) hypertension: Secondary | ICD-10-CM | POA: Diagnosis not present

## 2017-05-04 DIAGNOSIS — E0590 Thyrotoxicosis, unspecified without thyrotoxic crisis or storm: Secondary | ICD-10-CM | POA: Diagnosis not present

## 2017-05-05 DIAGNOSIS — I4891 Unspecified atrial fibrillation: Secondary | ICD-10-CM | POA: Diagnosis not present

## 2017-05-05 DIAGNOSIS — E039 Hypothyroidism, unspecified: Secondary | ICD-10-CM | POA: Diagnosis not present

## 2017-05-05 DIAGNOSIS — I10 Essential (primary) hypertension: Secondary | ICD-10-CM | POA: Diagnosis not present

## 2017-05-05 DIAGNOSIS — Z13220 Encounter for screening for lipoid disorders: Secondary | ICD-10-CM | POA: Diagnosis not present

## 2017-05-05 DIAGNOSIS — D696 Thrombocytopenia, unspecified: Secondary | ICD-10-CM | POA: Diagnosis not present

## 2017-05-05 DIAGNOSIS — I872 Venous insufficiency (chronic) (peripheral): Secondary | ICD-10-CM | POA: Diagnosis not present

## 2017-05-05 DIAGNOSIS — Z7901 Long term (current) use of anticoagulants: Secondary | ICD-10-CM | POA: Diagnosis not present

## 2017-05-05 DIAGNOSIS — E785 Hyperlipidemia, unspecified: Secondary | ICD-10-CM | POA: Diagnosis not present

## 2017-05-30 ENCOUNTER — Other Ambulatory Visit (HOSPITAL_COMMUNITY): Payer: Self-pay | Admitting: *Deleted

## 2017-05-30 DIAGNOSIS — D61818 Other pancytopenia: Secondary | ICD-10-CM

## 2017-05-31 ENCOUNTER — Inpatient Hospital Stay (HOSPITAL_COMMUNITY): Payer: Medicare Other

## 2017-05-31 ENCOUNTER — Inpatient Hospital Stay (HOSPITAL_COMMUNITY): Payer: Medicare Other | Attending: Internal Medicine | Admitting: Internal Medicine

## 2017-05-31 ENCOUNTER — Other Ambulatory Visit: Payer: Self-pay

## 2017-05-31 ENCOUNTER — Encounter (HOSPITAL_COMMUNITY): Payer: Self-pay | Admitting: Internal Medicine

## 2017-05-31 VITALS — BP 115/83 | HR 54 | Resp 20 | Ht 69.0 in | Wt 270.4 lb

## 2017-05-31 DIAGNOSIS — Z8673 Personal history of transient ischemic attack (TIA), and cerebral infarction without residual deficits: Secondary | ICD-10-CM | POA: Diagnosis not present

## 2017-05-31 DIAGNOSIS — E039 Hypothyroidism, unspecified: Secondary | ICD-10-CM | POA: Diagnosis not present

## 2017-05-31 DIAGNOSIS — I4891 Unspecified atrial fibrillation: Secondary | ICD-10-CM | POA: Insufficient documentation

## 2017-05-31 DIAGNOSIS — Z79899 Other long term (current) drug therapy: Secondary | ICD-10-CM | POA: Diagnosis not present

## 2017-05-31 DIAGNOSIS — K5730 Diverticulosis of large intestine without perforation or abscess without bleeding: Secondary | ICD-10-CM | POA: Diagnosis not present

## 2017-05-31 DIAGNOSIS — D61818 Other pancytopenia: Secondary | ICD-10-CM | POA: Diagnosis not present

## 2017-05-31 DIAGNOSIS — Z7901 Long term (current) use of anticoagulants: Secondary | ICD-10-CM | POA: Insufficient documentation

## 2017-05-31 DIAGNOSIS — I10 Essential (primary) hypertension: Secondary | ICD-10-CM | POA: Diagnosis not present

## 2017-05-31 LAB — CBC WITH DIFFERENTIAL/PLATELET
BASOS ABS: 0 10*3/uL (ref 0.0–0.1)
Basophils Relative: 0 %
EOS PCT: 6 %
Eosinophils Absolute: 0.2 10*3/uL (ref 0.0–0.7)
HEMATOCRIT: 36.2 % — AB (ref 39.0–52.0)
Hemoglobin: 11.5 g/dL — ABNORMAL LOW (ref 13.0–17.0)
LYMPHS ABS: 0.9 10*3/uL (ref 0.7–4.0)
LYMPHS PCT: 24 %
MCH: 31.4 pg (ref 26.0–34.0)
MCHC: 31.8 g/dL (ref 30.0–36.0)
MCV: 98.9 fL (ref 78.0–100.0)
MONO ABS: 0.6 10*3/uL (ref 0.1–1.0)
Monocytes Relative: 16 %
NEUTROS ABS: 2.2 10*3/uL (ref 1.7–7.7)
Neutrophils Relative %: 54 %
PLATELETS: 79 10*3/uL — AB (ref 150–400)
RBC: 3.66 MIL/uL — AB (ref 4.22–5.81)
RDW: 14.9 % (ref 11.5–15.5)
WBC: 4 10*3/uL (ref 4.0–10.5)

## 2017-05-31 LAB — COMPREHENSIVE METABOLIC PANEL
ALK PHOS: 40 U/L (ref 38–126)
ALT: 15 U/L — ABNORMAL LOW (ref 17–63)
AST: 18 U/L (ref 15–41)
Albumin: 3.7 g/dL (ref 3.5–5.0)
Anion gap: 9 (ref 5–15)
BILIRUBIN TOTAL: 1.1 mg/dL (ref 0.3–1.2)
BUN: 23 mg/dL — AB (ref 6–20)
CALCIUM: 8.6 mg/dL — AB (ref 8.9–10.3)
CO2: 23 mmol/L (ref 22–32)
CREATININE: 1.01 mg/dL (ref 0.61–1.24)
Chloride: 104 mmol/L (ref 101–111)
GFR calc Af Amer: >60 mL/min (ref >60)
Glucose, Bld: 140 mg/dL — ABNORMAL HIGH (ref 65–99)
POTASSIUM: 4.5 mmol/L (ref 3.5–5.1)
Sodium: 136 mmol/L (ref 135–145)
TOTAL PROTEIN: 8.2 g/dL — AB (ref 6.5–8.1)

## 2017-05-31 LAB — PROTIME-INR
INR: 2.45
PROTHROMBIN TIME: 26.4 s — AB (ref 11.4–15.2)

## 2017-05-31 LAB — SEDIMENTATION RATE: Sed Rate: 60 mm/hr — ABNORMAL HIGH (ref 0–16)

## 2017-05-31 LAB — LACTATE DEHYDROGENASE: LDH: 195 U/L — AB (ref 98–192)

## 2017-05-31 NOTE — Patient Instructions (Signed)
[place] at [facility] Discharge Instructions  RECOMMENDATIONS MADE BY THE CONSULTANT AND ANY TEST RESULTS WILL BE SENT TO YOUR REFERRING PHYSICIAN.  You were seen by Dr. Mathis Dad Higgs today Lab work today Follow up in 6 months with labs  Thank you for choosing [place] at [facility] to provide your oncology and hematology care.  To afford each patient quality time with our provider, please arrive at least 15 minutes before your scheduled appointment time.    If you have a lab appointment with the [facility] please come in thru the  Main Entrance and check in at the main information desk  You need to re-schedule your appointment should you arrive 10 or more minutes late.  We strive to give you quality time with our providers, and arriving late affects you and other patients whose appointments are after yours.  Also, if you no show three or more times for appointments you may be dismissed from the clinic at the providers discretion.     Again, thank you for choosing [facility].  Our hope is that these requests will decrease the amount of time that you wait before being seen by our physicians.       _____________________________________________________________  Should you have questions after your visit to [facility], please contact our office at [phone] between the hours of 8:30 a.m. and 4:30 p.m.  Voicemails left after 4:30 p.m. will not be returned until the following business day.  For prescription refill requests, have your pharmacy contact our office.       Resources For Cancer Patients and their Caregivers ? American Cancer Society: Can assist with transportation, wigs, general needs, runs Look Good Feel Better.        [phone] ? Cancer Care: Provides financial assistance, online support groups, medication/co-pay assistance.  1-800-813-HOPE 720-853-0623) ? [place] Assists [place] cancer patients and their families through emotional , educational and financial support.  [phone] ? [place] DSS Where to apply for food stamps, Medicaid and utility assistance. 786 510 6239 ? RCATS: Transportation to medical appointments. 630-620-0990 ? Social Security Administration: May apply for disability if have a Stage IV cancer. 516-385-6165 [phone] ? [employer], Disability and Transit Services: Assists with nutrition, care and transit needs. [place] Support Programs: @10RELATIVEDAYS @ > Cancer Support Group  2nd Tuesday of the month 1pm-2pm, Journey Room  > Creative Journey  3rd Tuesday of the month 1130am-1pm, Journey Room  > Look Good Feel Better  1st Wednesday of the month 10am-12 noon, Journey Room (Call [place] to register [phone])

## 2017-06-01 LAB — HEPATITIS B SURFACE ANTIBODY,QUALITATIVE: Hep B S Ab: NONREACTIVE

## 2017-06-01 LAB — HEPATITIS PANEL, ACUTE
HCV Ab: 0.7 s/co ratio (ref 0.0–0.9)
HEP A IGM: NEGATIVE
HEP B S AG: NEGATIVE
Hep B C IgM: NEGATIVE

## 2017-06-01 LAB — HEPATITIS B CORE ANTIBODY, TOTAL: HEP B C TOTAL AB: NEGATIVE

## 2017-06-01 LAB — HEPATITIS B SURFACE ANTIGEN: HEP B S AG: NEGATIVE

## 2017-06-02 DIAGNOSIS — Z13220 Encounter for screening for lipoid disorders: Secondary | ICD-10-CM | POA: Diagnosis not present

## 2017-06-02 DIAGNOSIS — I4891 Unspecified atrial fibrillation: Secondary | ICD-10-CM | POA: Diagnosis not present

## 2017-06-02 DIAGNOSIS — I10 Essential (primary) hypertension: Secondary | ICD-10-CM | POA: Diagnosis not present

## 2017-06-02 DIAGNOSIS — E039 Hypothyroidism, unspecified: Secondary | ICD-10-CM | POA: Diagnosis not present

## 2017-06-02 DIAGNOSIS — E785 Hyperlipidemia, unspecified: Secondary | ICD-10-CM | POA: Diagnosis not present

## 2017-06-02 DIAGNOSIS — Z7901 Long term (current) use of anticoagulants: Secondary | ICD-10-CM | POA: Diagnosis not present

## 2017-06-02 DIAGNOSIS — I872 Venous insufficiency (chronic) (peripheral): Secondary | ICD-10-CM | POA: Diagnosis not present

## 2017-06-02 DIAGNOSIS — D696 Thrombocytopenia, unspecified: Secondary | ICD-10-CM | POA: Diagnosis not present

## 2017-06-06 NOTE — Progress Notes (Signed)
Diagnosis Pancytopenia ([place]) - Plan: Hepatitis panel, acute, Hepatitis B surface antibody, Hepatitis C RNA quantitative, Hepatitis B surface antigen, Hepatitis B core antibody, total, Hepatitis B core antibody, total, Hepatitis panel, acute, Hepatitis B surface antibody, Hepatitis C RNA quantitative, Hepatitis B surface antigen, Protime-INR, Protime-INR, CANCELED: Hepatitis B core antibody, total  Staging Cancer Staging No matching staging information was found for the patient.  Assessment and Plan:  1.  Pancytopenia (HCC) Pancytopenia.  Labs reviewed and show WBC 4, HB 11.5.  Plts are decreased at 79,000.  Coagulation studies done and show an INR 2.45 with PT of 26.4.  Pt has previously undergone bone marrow aspiration and biopsy was negative on 11/25/2008 without any abnormal cytogentics.  He is currently on coumadin.  Awaiting PTT results.  Hep panel is negative.  May have to consider imaging if plts remain decreased.    2.  Thrombocytopenia.  He has undergone bone marrow biopsy previously in 11/2008 that was negative.  Plts are 79,000.  Hep panel is negative.  Will recommend imaging through liver for followup evaluation.    3.  Health maintenance.  He will be due for repeat colonoscopy in 2027.   CURRENT THERAPY: Observation  INTERVAL HISTORY: Bryce Hill 57 y.o. male returns for followup of  pancytopenia with negative bone marrow aspiration and biopsy on 11/25/2008.  On chart review, the patient had a leukopenia and thrombocytopenia in 2009. He started having anemia in 2010.       Current Status:  Pt here today for followup to go over labs.     Problem List Patient Active Problem List   Diagnosis Date Noted  . Colon cancer screening [Z12.11]   . Diverticulosis of colon without hemorrhage [K57.30]   . Encounter for screening colonoscopy [Z12.11] 03/25/2015  . Left-sided low back pain without sciatica [M54.5] 01/07/2014  . Pancytopenia [facility]) [T62.563] 01/11/2012    Past Medical  History Past Medical History:  Diagnosis Date  . Atrial fibrillation ([place])   . CVA (cerebral vascular accident) ([place])   . Hypertension   . Hypothyroidism   . Pancytopenia ([place]) 01/11/2012   Stable  . Thrombocytopenia [facility])     Past Surgical History Past Surgical History:  Procedure Laterality Date  . COLONOSCOPY N/A 04/22/2015   Procedure: COLONOSCOPY;  Surgeon: Daneil Dolin, MD;  Location: [facility];  Service: Endoscopy;  Laterality: N/A;  1245  . HERNIA REPAIR     as child    Family History Family History  Problem Relation Age of Onset  . Cancer Mother   . Colon cancer Unknown        unknown but THINKS his mom may have had     Social History  reports that  has never smoked. he has never used smokeless tobacco. He reports that he does not drink alcohol or use drugs.  Medications  Current Outpatient Medications:  .  acetaminophen (TYLENOL) 325 MG tablet, Take 650 mg by mouth every 6 (six) hours as needed., Disp: , Rfl:  .  Albuterol Sulfate 108 (90 BASE) MCG/ACT AEPB, Inhale into the lungs., Disp: , Rfl:  .  amLODipine (NORVASC) 5 MG tablet, Take 5 mg by mouth daily.  , Disp: , Rfl:  .  bisacodyl (DULCOLAX) 5 MG EC tablet, Take 1 tablet (5 mg total) by mouth daily as needed for moderate constipation., Disp: 2 tablet, Rfl: 0 .  carvedilol (COREG) 12.5 MG tablet, Take 12.5 mg by mouth 2 (two) times daily with a meal.  ,  Disp: , Rfl:  .  furosemide (LASIX) 20 MG tablet, , Disp: , Rfl:  .  levothyroxine (SYNTHROID, LEVOTHROID) 100 MCG tablet, Take 125 mcg by mouth daily. , Disp: , Rfl:  .  levothyroxine (SYNTHROID, LEVOTHROID) 125 MCG tablet, , Disp: , Rfl:  .  lisinopril (PRINIVIL,ZESTRIL) 20 MG tablet, , Disp: , Rfl:  .  loratadine (CLARITIN) 10 MG tablet, Take 10 mg by mouth daily., Disp: , Rfl:  .  phosphate laxative (FLEET) 2.7-7.2 GM/15ML solution, Take 15 mLs by mouth once., Disp: 45 mL, Rfl: 0 .  polyethylene glycol-electrolytes (NULYTELY/GOLYTELY) 420 G solution,  Take 4,000 mLs by mouth once., Disp: 4000 mL, Rfl: 0 .  potassium chloride SA (K-DUR,KLOR-CON) 20 MEQ tablet, Take 20 mEq by mouth 2 (two) times daily.  , Disp: , Rfl:  .  Psyllium (QC FIBER LAXATIVE PO), Take 625 mg by mouth daily. 2 tabs , Disp: , Rfl:  .  warfarin (COUMADIN) 5 MG tablet, Take 5 mg by mouth daily. 5 mg MWF; 7.5 mg Tuesday, Thurs, Sat, Sun, Disp: , Rfl:   Allergies Patient has no known allergies.  Review of Systems Review of Systems - Oncology ROS as per HPI otherwise 12 point ROS is negative.   Physical Exam  Vitals Wt Readings from Last 3 Encounters:  05/31/17 270 lb 6.4 oz (122.7 kg)  01/08/16 261 lb 14.4 oz (118.8 kg)  07/08/15 263 lb (119.3 kg)   Temp Readings from Last 3 Encounters:  01/08/16 97.5 F (36.4 C) (Oral)  07/08/15 97.7 F (36.5 C) (Oral)  04/22/15 97.6 F (36.4 C) (Oral)   BP Readings from Last 3 Encounters:  05/31/17 115/83  01/08/16 115/76  07/08/15 114/68   Pulse Readings from Last 3 Encounters:  05/31/17 (!) 54  01/08/16 77  07/08/15 81    Constitutional: Well-developed, well-nourished, and in no distress.   HENT:  Head: Normocephalic and atraumatic.  Mouth/Throat: No oropharyngeal exudate. Mucosa moist. Eyes: Pupils are equal, round, and reactive to light. Conjunctivae are normal. No scleral icterus.  Neck: Normal range of motion. Neck supple. No JVD present.  Cardiovascular: Normal rate, regular rhythm and normal heart sounds.  Exam reveals no gallop and no friction rub.   No murmur heard. Pulmonary/Chest: Effort normal and breath sounds normal. No respiratory distress. No wheezes.No rales.  Abdominal: Soft. Bowel sounds are normal. No distension. There is no tenderness. There is no guarding.  Musculoskeletal: No edema or tenderness.  Lymphadenopathy:    No cervical or supraclavicular adenopathy.  Neurological: Alert.  Evidence of prior CVA Skin: Skin is warm and dry. No rash noted. No erythema. No pallor.  Psychiatric:  Affect and judgment normal.   Labs Office Visit on 05/31/2017  Component Date Value Ref Range Status  . Hep B Core Total Ab 05/31/2017 Negative  Negative Final   Comment: (NOTE) Performed At: [facility] [place], [place] [id number] Rush Farmer MD [id number] Performed at [facility], [address] [place],  [address]   . Hepatitis B Surface Ag 05/31/2017 Negative  Negative Final  . HCV Ab 05/31/2017 0.7  0.0 - 0.9 s/co ratio Final   Comment: (NOTE)                                  Negative:     < 0.8  Indeterminate: 0.8 - 0.9                                  Positive:     > 0.9 The CDC recommends that a positive HCV antibody result be followed up with a HCV Nucleic Acid Amplification test ([id number]). Performed At: [facility] [place], [place] [id number] Rush Farmer MD [id number]   . Hep A IgM 05/31/2017 Negative  Negative Final  . Hep B C IgM 05/31/2017 Negative  Negative Final   Performed at [facility], [address], [place], [place] [address]  . Hep B S Ab 05/31/2017 Non Reactive   Final   Comment: (NOTE)              Non Reactive: Inconsistent with immunity,                            less than 10 mIU/mL              Reactive:     Consistent with immunity,                            greater than 9.9 mIU/mL Performed At: [facility] [place], [place] [id number] Rush Farmer MD [id number] Performed at [facility], [address], [place], [place] [address]   . Hepatitis B Surface Ag 05/31/2017 Negative  Negative Final   Comment: (NOTE) Performed At: [facility] [place], [place] [id number] Rush Farmer MD [id number] Performed at [facility], [address], [place], [place] [address]   . Prothrombin Time 05/31/2017 26.4* 11.4 - 15.2 seconds Final  . INR 05/31/2017 2.45   Final   Performed at [facility], [address], [place], [place] [address]  Appointment on 05/31/2017  Component Date Value Ref Range Status  . WBC 05/31/2017 4.0  4.0 - 10.5 K/uL Final  . RBC 05/31/2017 3.66* 4.22 - 5.81 MIL/uL Final  . Hemoglobin 05/31/2017 11.5* 13.0 - 17.0 g/dL Final  . HCT 05/31/2017 36.2* 39.0 - 52.0 % Final  . MCV 05/31/2017 98.9  78.0 - 100.0 fL Final  . MCH 05/31/2017 31.4  26.0 - 34.0 pg Final  . MCHC 05/31/2017 31.8  30.0 - 36.0 g/dL Final  . RDW 05/31/2017 14.9  11.5 - 15.5 % Final  . Platelets 05/31/2017 79* 150 - 400 K/uL Final   Comment: PLATELET COUNT CONFIRMED BY SMEAR SPECIMEN CHECKED FOR CLOTS   . Neutrophils Relative % 05/31/2017 54  % Final  . Neutro Abs 05/31/2017 2.2  1.7 - 7.7 K/uL Final  . Lymphocytes Relative 05/31/2017 24  % Final  . Lymphs Abs 05/31/2017 0.9  0.7 - 4.0 K/uL Final  . Monocytes Relative 05/31/2017 16  % Final  . Monocytes Absolute 05/31/2017 0.6  0.1 - 1.0 K/uL Final  . Eosinophils Relative 05/31/2017 6  % Final  . Eosinophils Absolute 05/31/2017 0.2  0.0 - 0.7 K/uL Final  . Basophils Relative 05/31/2017 0  % Final  . Basophils Absolute 05/31/2017 0.0  0.0 - 0.1 K/uL Final   Performed at [facility], [address], [place], [place] [address]  . Sodium 05/31/2017 136  135 - 145 mmol/L Final  . Potassium 05/31/2017 4.5  3.5 - 5.1 mmol/L Final  . Chloride 05/31/2017 104  101 -  111 mmol/L Final  . CO2 05/31/2017 23  22 - 32 mmol/L Final  . Glucose, Bld 05/31/2017 140* 65 - 99 mg/dL Final  . BUN 05/31/2017 23* 6 - 20 mg/dL Final  . Creatinine, Ser 05/31/2017 1.01  0.61 - 1.24 mg/dL Final  . Calcium 05/31/2017 8.6* 8.9 - 10.3 mg/dL Final  . Total Protein 05/31/2017 8.2* 6.5 - 8.1 g/dL Final  . Albumin 05/31/2017 3.7  3.5 - 5.0 g/dL Final  . AST 05/31/2017 18  15 - 41 U/L Final  . ALT 05/31/2017 15* 17 - 63 U/L Final  . Alkaline Phosphatase 05/31/2017 40  38 - 126 U/L Final  . Total Bilirubin 05/31/2017 1.1  0.3 - 1.2 mg/dL Final  . GFR calc non Af Amer 05/31/2017  >60  >60 mL/min Final  . GFR calc Af Amer 05/31/2017 >60  >60 mL/min Final   Comment: (NOTE) The eGFR has been calculated using the CKD EPI equation. This calculation has not been validated in all clinical situations. eGFR's persistently <60 mL/min signify possible Chronic Kidney Disease.   Georgiann Hahn gap 05/31/2017 9  5 - 15 Final   Performed at [facility], [address] [place], [place] [address]  . LDH 05/31/2017 195* 98 - 192 U/L Final   Performed at [facility], [address] [place], [place] [address]  . Sed Rate 05/31/2017 60* 0 - 16 mm/hr Final   Performed at [facility], [address], [place], [place] [address]     Pathology Orders Placed This Encounter  Procedures  . Hepatitis panel, acute    Standing Status:   Future    Number of Occurrences:   1    Standing Expiration Date:   05/31/2018  . Hepatitis B surface antibody    Standing Status:   Future    Number of Occurrences:   1    Standing Expiration Date:   05/31/2018  . Hepatitis C RNA quantitative    Standing Status:   Future    Number of Occurrences:   1    Standing Expiration Date:   05/31/2018  . Hepatitis B surface antigen    Standing Status:   Future    Number of Occurrences:   1    Standing Expiration Date:   05/31/2018  . Hepatitis B core antibody, total  . Hepatitis B core antibody, total    Standing Status:   Future    Number of Occurrences:   1    Standing Expiration Date:   05/31/2018  . Protime-INR    Standing Status:   Future    Number of Occurrences:   1    Standing Expiration Date:   05/31/2018       Zoila Shutter MD

## 2017-06-30 DIAGNOSIS — Z7901 Long term (current) use of anticoagulants: Secondary | ICD-10-CM | POA: Diagnosis not present

## 2017-06-30 DIAGNOSIS — I10 Essential (primary) hypertension: Secondary | ICD-10-CM | POA: Diagnosis not present

## 2017-06-30 DIAGNOSIS — D696 Thrombocytopenia, unspecified: Secondary | ICD-10-CM | POA: Diagnosis not present

## 2017-06-30 DIAGNOSIS — E039 Hypothyroidism, unspecified: Secondary | ICD-10-CM | POA: Diagnosis not present

## 2017-06-30 DIAGNOSIS — I872 Venous insufficiency (chronic) (peripheral): Secondary | ICD-10-CM | POA: Diagnosis not present

## 2017-06-30 DIAGNOSIS — E785 Hyperlipidemia, unspecified: Secondary | ICD-10-CM | POA: Diagnosis not present

## 2017-06-30 DIAGNOSIS — I4891 Unspecified atrial fibrillation: Secondary | ICD-10-CM | POA: Diagnosis not present

## 2017-06-30 DIAGNOSIS — Z13220 Encounter for screening for lipoid disorders: Secondary | ICD-10-CM | POA: Diagnosis not present

## 2017-07-15 DIAGNOSIS — D649 Anemia, unspecified: Secondary | ICD-10-CM | POA: Diagnosis not present

## 2017-07-15 DIAGNOSIS — I6350 Cerebral infarction due to unspecified occlusion or stenosis of unspecified cerebral artery: Secondary | ICD-10-CM | POA: Diagnosis not present

## 2017-07-15 DIAGNOSIS — I10 Essential (primary) hypertension: Secondary | ICD-10-CM | POA: Diagnosis not present

## 2017-07-15 DIAGNOSIS — R789 Finding of unspecified substance, not normally found in blood: Secondary | ICD-10-CM | POA: Diagnosis not present

## 2017-08-02 DIAGNOSIS — E039 Hypothyroidism, unspecified: Secondary | ICD-10-CM | POA: Diagnosis not present

## 2017-08-02 DIAGNOSIS — Z7901 Long term (current) use of anticoagulants: Secondary | ICD-10-CM | POA: Diagnosis not present

## 2017-08-02 DIAGNOSIS — I872 Venous insufficiency (chronic) (peripheral): Secondary | ICD-10-CM | POA: Diagnosis not present

## 2017-08-02 DIAGNOSIS — I639 Cerebral infarction, unspecified: Secondary | ICD-10-CM | POA: Diagnosis not present

## 2017-08-02 DIAGNOSIS — I482 Chronic atrial fibrillation: Secondary | ICD-10-CM | POA: Diagnosis not present

## 2017-08-02 DIAGNOSIS — D696 Thrombocytopenia, unspecified: Secondary | ICD-10-CM | POA: Diagnosis not present

## 2017-08-02 DIAGNOSIS — R609 Edema, unspecified: Secondary | ICD-10-CM | POA: Diagnosis not present

## 2017-08-02 DIAGNOSIS — I10 Essential (primary) hypertension: Secondary | ICD-10-CM | POA: Diagnosis not present

## 2017-08-02 DIAGNOSIS — Z13220 Encounter for screening for lipoid disorders: Secondary | ICD-10-CM | POA: Diagnosis not present

## 2017-08-02 DIAGNOSIS — Z1211 Encounter for screening for malignant neoplasm of colon: Secondary | ICD-10-CM | POA: Diagnosis not present

## 2017-08-02 DIAGNOSIS — E785 Hyperlipidemia, unspecified: Secondary | ICD-10-CM | POA: Diagnosis not present

## 2017-08-18 ENCOUNTER — Ambulatory Visit (HOSPITAL_COMMUNITY)
Admission: RE | Admit: 2017-08-18 | Discharge: 2017-08-18 | Disposition: A | Discharge status: Home / Self Care | Payer: Medicare Other | Source: Ambulatory Visit | Attending: Internal Medicine | Admitting: Internal Medicine

## 2017-08-18 ENCOUNTER — Other Ambulatory Visit (HOSPITAL_COMMUNITY): Payer: Self-pay | Admitting: Internal Medicine

## 2017-08-18 DIAGNOSIS — M1712 Unilateral primary osteoarthritis, left knee: Secondary | ICD-10-CM | POA: Diagnosis not present

## 2017-08-18 DIAGNOSIS — M25562 Pain in left knee: Secondary | ICD-10-CM

## 2017-08-18 DIAGNOSIS — I10 Essential (primary) hypertension: Secondary | ICD-10-CM | POA: Diagnosis not present

## 2017-08-18 DIAGNOSIS — I480 Paroxysmal atrial fibrillation: Secondary | ICD-10-CM | POA: Diagnosis not present

## 2017-08-30 DIAGNOSIS — I872 Venous insufficiency (chronic) (peripheral): Secondary | ICD-10-CM | POA: Diagnosis not present

## 2017-08-30 DIAGNOSIS — I10 Essential (primary) hypertension: Secondary | ICD-10-CM | POA: Diagnosis not present

## 2017-08-30 DIAGNOSIS — Z13220 Encounter for screening for lipoid disorders: Secondary | ICD-10-CM | POA: Diagnosis not present

## 2017-08-30 DIAGNOSIS — Z7901 Long term (current) use of anticoagulants: Secondary | ICD-10-CM | POA: Diagnosis not present

## 2017-08-30 DIAGNOSIS — D696 Thrombocytopenia, unspecified: Secondary | ICD-10-CM | POA: Diagnosis not present

## 2017-08-30 DIAGNOSIS — E785 Hyperlipidemia, unspecified: Secondary | ICD-10-CM | POA: Diagnosis not present

## 2017-08-30 DIAGNOSIS — R609 Edema, unspecified: Secondary | ICD-10-CM | POA: Diagnosis not present

## 2017-08-30 DIAGNOSIS — I482 Chronic atrial fibrillation: Secondary | ICD-10-CM | POA: Diagnosis not present

## 2017-08-30 DIAGNOSIS — I639 Cerebral infarction, unspecified: Secondary | ICD-10-CM | POA: Diagnosis not present

## 2017-08-30 DIAGNOSIS — Z1211 Encounter for screening for malignant neoplasm of colon: Secondary | ICD-10-CM | POA: Diagnosis not present

## 2017-08-30 DIAGNOSIS — E039 Hypothyroidism, unspecified: Secondary | ICD-10-CM | POA: Diagnosis not present

## 2017-09-22 DIAGNOSIS — I482 Chronic atrial fibrillation: Secondary | ICD-10-CM | POA: Diagnosis not present

## 2017-09-22 DIAGNOSIS — Z13220 Encounter for screening for lipoid disorders: Secondary | ICD-10-CM | POA: Diagnosis not present

## 2017-09-22 DIAGNOSIS — Z1211 Encounter for screening for malignant neoplasm of colon: Secondary | ICD-10-CM | POA: Diagnosis not present

## 2017-09-22 DIAGNOSIS — R609 Edema, unspecified: Secondary | ICD-10-CM | POA: Diagnosis not present

## 2017-09-22 DIAGNOSIS — D696 Thrombocytopenia, unspecified: Secondary | ICD-10-CM | POA: Diagnosis not present

## 2017-09-22 DIAGNOSIS — I872 Venous insufficiency (chronic) (peripheral): Secondary | ICD-10-CM | POA: Diagnosis not present

## 2017-09-22 DIAGNOSIS — I639 Cerebral infarction, unspecified: Secondary | ICD-10-CM | POA: Diagnosis not present

## 2017-09-22 DIAGNOSIS — E785 Hyperlipidemia, unspecified: Secondary | ICD-10-CM | POA: Diagnosis not present

## 2017-09-22 DIAGNOSIS — Z7901 Long term (current) use of anticoagulants: Secondary | ICD-10-CM | POA: Diagnosis not present

## 2017-09-22 DIAGNOSIS — I10 Essential (primary) hypertension: Secondary | ICD-10-CM | POA: Diagnosis not present

## 2017-09-22 DIAGNOSIS — E039 Hypothyroidism, unspecified: Secondary | ICD-10-CM | POA: Diagnosis not present

## 2017-10-05 ENCOUNTER — Encounter: Payer: Self-pay | Admitting: Orthopaedic Surgery

## 2017-10-05 ENCOUNTER — Ambulatory Visit (INDEPENDENT_AMBULATORY_CARE_PROVIDER_SITE_OTHER): Payer: Medicare Other | Admitting: Orthopaedic Surgery

## 2017-10-05 VITALS — BP 106/77 | HR 106 | Ht 66.0 in | Wt 261.0 lb

## 2017-10-05 DIAGNOSIS — G8929 Other chronic pain: Secondary | ICD-10-CM

## 2017-10-05 DIAGNOSIS — M25562 Pain in left knee: Secondary | ICD-10-CM

## 2017-10-05 NOTE — Progress Notes (Signed)
Subjective:    Patient ID: Bryce Hill, male    DOB: 1961/01/14, 57 y.o.   MRN: [id number]  HPI He has pain of the left knee that has been present for some time. He has followed with Dr. Wolfgang Phoenix who got X-rays of the left knee 08-18-17.  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 No fracture or joint effusion evident.  He has swelling and popping of the left knee with no giving way.  He has no redness, no trauma.  He is a resident at [facility].  I have completed paper work for them for this visit.  Review of Systems  Constitutional: Positive for activity change.  Respiratory: Positive for shortness of breath. Negative for cough.   Cardiovascular: Positive for palpitations and leg swelling.  Musculoskeletal: Positive for arthralgias, gait problem and joint swelling.   Past Medical History:  Diagnosis Date  . Atrial fibrillation ([place])   . CVA (cerebral vascular accident) ([place])   . Hypertension   . Hypothyroidism   . Pancytopenia ([place]) 01/11/2012   Stable  . Thrombocytopenia ([place])     Past Surgical History:  Procedure Laterality Date  . COLONOSCOPY N/A 04/22/2015   Procedure: COLONOSCOPY;  Surgeon: Daneil Dolin, MD;  Location: [facility];  Service: Endoscopy;  Laterality: N/A;  1245  . HERNIA REPAIR     as child    Current Outpatient Medications on File Prior to Visit  Medication Sig Dispense Refill  . acetaminophen (TYLENOL) 325 MG tablet Take 650 mg by mouth every 6 (six) hours as needed.    . Albuterol Sulfate 108 (90 BASE) MCG/ACT AEPB Inhale into the lungs.    Marland Kitchen amLODipine (NORVASC) 5 MG tablet Take 5 mg by mouth daily.      . bisacodyl (DULCOLAX) 5 MG EC tablet Take 1 tablet (5 mg total) by mouth daily as needed for moderate constipation. 2 tablet 0  . carvedilol (COREG) 12.5 MG tablet Take 12.5 mg by mouth 2 (two) times daily with a meal.      . furosemide (LASIX) 20 MG tablet     . levothyroxine  (SYNTHROID, LEVOTHROID) 100 MCG tablet Take 125 mcg by mouth daily.     Marland Kitchen levothyroxine (SYNTHROID, LEVOTHROID) 125 MCG tablet     . lisinopril (PRINIVIL,ZESTRIL) 20 MG tablet     . loratadine (CLARITIN) 10 MG tablet Take 10 mg by mouth daily.    . phosphate laxative (FLEET) 2.7-7.2 GM/15ML solution Take 15 mLs by mouth once. 45 mL 0  . polyethylene glycol-electrolytes (NULYTELY/GOLYTELY) 420 G solution Take 4,000 mLs by mouth once. 4000 mL 0  . potassium chloride SA (K-DUR,KLOR-CON) 20 MEQ tablet Take 20 mEq by mouth 2 (two) times daily.      . Psyllium (QC FIBER LAXATIVE PO) Take 625 mg by mouth daily. 2 tabs     . warfarin (COUMADIN) 5 MG tablet Take 5 mg by mouth daily. 5 mg MWF; 7.5 mg Tuesday, Thurs, Sat, Sun     No current facility-administered medications on file prior to visit.     Social History   Socioeconomic History  . Marital status: Single    Spouse name: Not on file  . Number of children: Not on file  . Years of education: Not on file  . Highest education level: Not on file  Occupational History  . Not on file  Social Needs  . Financial resource strain: Not on file  . Food insecurity:  Worry: Not on file    Inability: Not on file  . Transportation needs:    Medical: Not on file    Non-medical: Not on file  Tobacco Use  . Smoking status: Never Smoker  . Smokeless tobacco: Never Used  Substance and Sexual Activity  . Alcohol use: No    Alcohol/week: 0.0 oz  . Drug use: No  . Sexual activity: Not on file  Lifestyle  . Physical activity:    Days per week: Not on file    Minutes per session: Not on file  . Stress: Not on file  Relationships  . Social connections:    Talks on phone: Not on file    Gets together: Not on file    Attends religious service: Not on file    Active member of club or organization: Not on file    Attends meetings of clubs or organizations: Not on file    Relationship status: Not on file  . Intimate partner violence:    Fear of  current or ex partner: Not on file    Emotionally abused: Not on file    Physically abused: Not on file    Forced sexual activity: Not on file  Other Topics Concern  . Not on file  Social History Narrative  . Not on file    Family History  Problem Relation Age of Onset  . Cancer Mother   . Colon cancer Unknown        unknown but THINKS his mom may have had    BP 106/77   Pulse (!) 106   Ht 5\' 6"  (1.676 m)   Wt 261 lb (118.4 kg)   BMI 42.13 kg/m       Objective:   Physical Exam  Constitutional: He is oriented to person, place, and time. He appears well-developed and well-nourished.  HENT:  Head: Normocephalic and atraumatic.  Eyes: Pupils are equal, round, and reactive to light. Conjunctivae and EOM are normal.  Neck: Normal range of motion. Neck supple.  Cardiovascular: Normal rate, regular rhythm and intact distal pulses.  Pulmonary/Chest: Effort normal.  Abdominal: Soft.  Musculoskeletal:       Left knee: He exhibits decreased range of motion, swelling and effusion. Tenderness found. Medial joint line tenderness noted.       Legs: Neurological: He is alert and oriented to person, place, and time. He has normal reflexes. He displays normal reflexes. No cranial nerve deficit. He exhibits normal muscle tone. Coordination abnormal.  Skin: Skin is warm and dry.  Psychiatric: He has a normal mood and affect. His behavior is normal. Judgment and thought content normal.    I have reviewed Dr. Lance Sell notes, the X-rays and xray report.      Assessment & Plan:   Encounter Diagnosis  Name Primary?  . Chronic pain of left knee Yes   PROCEDURE NOTE:  The patient requests injections of the left knee , verbal consent was obtained.  The left knee was prepped appropriately after time out was performed.   Sterile technique was observed and injection of 1 cc of Depo-Medrol 40 mg with several cc's of plain xylocaine. Anesthesia was provided by ethyl chloride and a 20-gauge  needle was used to inject the knee area. The injection was tolerated well.  A band aid dressing was applied.  The patient was advised to apply ice later today and tomorrow to the injection sight as needed.  He has DJD of the left knee.  I will  see how he does with the injection.  Return in two weeks.  Call if any problem.  Precautions discussed.   Electronically Signed Sanjuana Kava, MD 6/19/20192:44 PM

## 2017-10-19 ENCOUNTER — Ambulatory Visit (INDEPENDENT_AMBULATORY_CARE_PROVIDER_SITE_OTHER): Payer: Medicare Other | Admitting: Orthopaedic Surgery

## 2017-10-19 ENCOUNTER — Encounter: Payer: Self-pay | Admitting: Orthopaedic Surgery

## 2017-10-19 VITALS — BP 161/79 | HR 83 | Temp 98.1°F | Ht 66.0 in

## 2017-10-19 DIAGNOSIS — M25562 Pain in left knee: Secondary | ICD-10-CM

## 2017-10-19 DIAGNOSIS — G8929 Other chronic pain: Secondary | ICD-10-CM | POA: Diagnosis not present

## 2017-10-19 NOTE — Progress Notes (Signed)
Patient NL:[id number] Bryce Hill, male DOB:Dec 02, 1960, 57 y.o. RDE:[id number]  Chief Complaint  Patient presents with  . Knee Pain    left    HPI  Bryce Hill is a 57 y.o. male who has chronic left knee pain.  He is better after the injection. He still has pain but it is less and he has less swelling.  He has no new trauma.  He is active and a resident at a local rest home.   Body mass index is 42.13 kg/m.  ROS  Review of Systems  Constitutional: Positive for activity change.  Respiratory: Positive for shortness of breath. Negative for cough.   Cardiovascular: Positive for palpitations and leg swelling.  Musculoskeletal: Positive for arthralgias, gait problem and joint swelling.    All other systems reviewed and are negative.  Past Medical History:  Diagnosis Date  . Atrial fibrillation ([place])   . CVA (cerebral vascular accident) ([place])   . Hypertension   . Hypothyroidism   . Pancytopenia ([place]) 01/11/2012   Stable  . Thrombocytopenia ([place])     Past Surgical History:  Procedure Laterality Date  . COLONOSCOPY N/A 04/22/2015   Procedure: COLONOSCOPY;  Surgeon: Daneil Dolin, MD;  [facility];  Service: Endoscopy;  Laterality: N/A;  1245  . HERNIA REPAIR     as child    Family History  Problem Relation Age of Onset  . Cancer Mother   . Colon cancer Unknown        unknown but THINKS his mom may have had    Social History Social History   Tobacco Use  . Smoking status: Never Smoker  . Smokeless tobacco: Never Used  Substance Use Topics  . Alcohol use: No    Alcohol/week: 0.0 oz  . Drug use: No    No Known Allergies  Current Outpatient Medications  Medication Sig Dispense Refill  . acetaminophen (TYLENOL) 325 MG tablet Take 650 mg by mouth every 6 (six) hours as needed.    . Albuterol Sulfate 108 (90 BASE) MCG/ACT AEPB Inhale into the lungs.    Marland Kitchen amLODipine (NORVASC) 5 MG tablet Take 5 mg by mouth daily.      . bisacodyl (DULCOLAX) 5 MG EC tablet  Take 1 tablet (5 mg total) by mouth daily as needed for moderate constipation. 2 tablet 0  . carvedilol (COREG) 12.5 MG tablet Take 12.5 mg by mouth 2 (two) times daily with a meal.      . furosemide (LASIX) 20 MG tablet     . levothyroxine (SYNTHROID, LEVOTHROID) 100 MCG tablet Take 125 mcg by mouth daily.     Marland Kitchen lisinopril (PRINIVIL,ZESTRIL) 20 MG tablet     . loratadine (CLARITIN) 10 MG tablet Take 10 mg by mouth daily.    . phosphate laxative (FLEET) 2.7-7.2 GM/15ML solution Take 15 mLs by mouth once. 45 mL 0  . polyethylene glycol-electrolytes (NULYTELY/GOLYTELY) 420 G solution Take 4,000 mLs by mouth once. 4000 mL 0  . potassium chloride SA (K-DUR,KLOR-CON) 20 MEQ tablet Take 20 mEq by mouth 2 (two) times daily.      . Psyllium (QC FIBER LAXATIVE PO) Take 625 mg by mouth daily. 2 tabs     . warfarin (COUMADIN) 5 MG tablet Take 5 mg by mouth daily. 5 mg MWF; 7.5 mg Tuesday, Thurs, Sat, Sun    . levothyroxine (SYNTHROID, LEVOTHROID) 125 MCG tablet      No current facility-administered medications for this visit.      Physical Exam  Blood pressure (!) 161/79, pulse 83, temperature 98.1 F (36.7 C), height 5\' 6"  (1.676 m).  Constitutional: overall normal hygiene, normal nutrition, well developed, normal grooming, normal body habitus. Assistive device:none  Musculoskeletal: gait and station Limp left, muscle tone and strength are normal, no tremors or atrophy is present.  .  Neurological: coordination overall normal.  Deep tendon reflex/nerve stretch intact.  Sensation normal.  Cranial nerves II-XII intact.   Skin:   Normal overall no scars, lesions, ulcers or rashes. No psoriasis.  Psychiatric: Alert and oriented x 3.  Recent memory intact, remote memory unclear.  Normal mood and affect. Well groomed.  Good eye contact.  Cardiovascular: overall no swelling, no varicosities, no edema bilaterally, normal temperatures of the legs and arms, no clubbing, cyanosis and good capillary  refill.  Lymphatic: palpation is normal.  Left knee has effusion, ROM 0 to 100, crepitus is present, NV intact. Limp to the left.  All other systems reviewed and are negative   The patient has been educated about the nature of the problem(s) and counseled on treatment options.  The patient appeared to understand what I have discussed and is in agreement with it.  Encounter Diagnosis  Name Primary?  . Chronic pain of left knee Yes    PLAN Call if any problems.  Precautions discussed.  Continue current medications.   Return to clinic 1 month   Forms for the rest home completed.  Electronically Signed Sanjuana Kava, MD 7/3/20198:59 AM

## 2017-11-02 DIAGNOSIS — I480 Paroxysmal atrial fibrillation: Secondary | ICD-10-CM | POA: Diagnosis not present

## 2017-11-02 DIAGNOSIS — I10 Essential (primary) hypertension: Secondary | ICD-10-CM | POA: Diagnosis not present

## 2017-11-02 DIAGNOSIS — E039 Hypothyroidism, unspecified: Secondary | ICD-10-CM | POA: Diagnosis not present

## 2017-11-02 DIAGNOSIS — D61818 Other pancytopenia: Secondary | ICD-10-CM | POA: Diagnosis not present

## 2017-11-14 DIAGNOSIS — E785 Hyperlipidemia, unspecified: Secondary | ICD-10-CM | POA: Diagnosis not present

## 2017-11-14 DIAGNOSIS — Z136 Encounter for screening for cardiovascular disorders: Secondary | ICD-10-CM | POA: Diagnosis not present

## 2017-11-14 DIAGNOSIS — E039 Hypothyroidism, unspecified: Secondary | ICD-10-CM | POA: Diagnosis not present

## 2017-11-14 DIAGNOSIS — D696 Thrombocytopenia, unspecified: Secondary | ICD-10-CM | POA: Diagnosis not present

## 2017-11-14 DIAGNOSIS — I4891 Unspecified atrial fibrillation: Secondary | ICD-10-CM | POA: Diagnosis not present

## 2017-11-14 DIAGNOSIS — I10 Essential (primary) hypertension: Secondary | ICD-10-CM | POA: Diagnosis not present

## 2017-11-14 DIAGNOSIS — I872 Venous insufficiency (chronic) (peripheral): Secondary | ICD-10-CM | POA: Diagnosis not present

## 2017-11-17 ENCOUNTER — Ambulatory Visit: Payer: Medicare Other | Admitting: Orthopaedic Surgery

## 2017-11-23 ENCOUNTER — Other Ambulatory Visit (HOSPITAL_COMMUNITY): Payer: Self-pay

## 2017-11-23 ENCOUNTER — Ambulatory Visit: Payer: Medicare Other | Admitting: Orthopaedic Surgery

## 2017-11-23 DIAGNOSIS — D61818 Other pancytopenia: Secondary | ICD-10-CM

## 2017-11-29 ENCOUNTER — Inpatient Hospital Stay (HOSPITAL_COMMUNITY): Payer: Medicare Other | Attending: Hematology | Admitting: Internal Medicine

## 2017-11-29 ENCOUNTER — Encounter (HOSPITAL_COMMUNITY): Payer: Self-pay | Admitting: Internal Medicine

## 2017-11-29 ENCOUNTER — Inpatient Hospital Stay (HOSPITAL_COMMUNITY): Payer: Medicare Other

## 2017-11-29 VITALS — BP 89/59 | HR 100 | Temp 98.4°F | Resp 14 | Wt 253.1 lb

## 2017-11-29 DIAGNOSIS — D61818 Other pancytopenia: Secondary | ICD-10-CM | POA: Insufficient documentation

## 2017-11-29 DIAGNOSIS — R0602 Shortness of breath: Secondary | ICD-10-CM

## 2017-11-29 DIAGNOSIS — E039 Hypothyroidism, unspecified: Secondary | ICD-10-CM

## 2017-11-29 DIAGNOSIS — D696 Thrombocytopenia, unspecified: Secondary | ICD-10-CM | POA: Insufficient documentation

## 2017-11-29 DIAGNOSIS — I10 Essential (primary) hypertension: Secondary | ICD-10-CM

## 2017-11-29 DIAGNOSIS — Z7901 Long term (current) use of anticoagulants: Secondary | ICD-10-CM | POA: Insufficient documentation

## 2017-11-29 DIAGNOSIS — R634 Abnormal weight loss: Secondary | ICD-10-CM

## 2017-11-29 DIAGNOSIS — R1084 Generalized abdominal pain: Secondary | ICD-10-CM | POA: Diagnosis not present

## 2017-11-29 DIAGNOSIS — I4891 Unspecified atrial fibrillation: Secondary | ICD-10-CM | POA: Insufficient documentation

## 2017-11-29 LAB — CBC WITH DIFFERENTIAL/PLATELET
BASOS ABS: 0 10*3/uL (ref 0.0–0.1)
BASOS PCT: 0 %
Eosinophils Absolute: 0.2 10*3/uL (ref 0.0–0.7)
Eosinophils Relative: 7 %
HEMATOCRIT: 36.9 % — AB (ref 39.0–52.0)
Hemoglobin: 12.2 g/dL — ABNORMAL LOW (ref 13.0–17.0)
Lymphocytes Relative: 34 %
Lymphs Abs: 1.2 10*3/uL (ref 0.7–4.0)
MCH: 31.9 pg (ref 26.0–34.0)
MCHC: 33.1 g/dL (ref 30.0–36.0)
MCV: 96.6 fL (ref 78.0–100.0)
Monocytes Absolute: 0.7 10*3/uL (ref 0.1–1.0)
Monocytes Relative: 18 %
NEUTROS ABS: 1.5 10*3/uL — AB (ref 1.7–7.7)
NEUTROS PCT: 41 %
Platelets: 71 10*3/uL — ABNORMAL LOW (ref 150–400)
RBC: 3.82 MIL/uL — AB (ref 4.22–5.81)
RDW: 14.9 % (ref 11.5–15.5)
WBC: 3.6 10*3/uL — AB (ref 4.0–10.5)

## 2017-11-29 LAB — COMPREHENSIVE METABOLIC PANEL
ALBUMIN: 3.7 g/dL (ref 3.5–5.0)
ALK PHOS: 38 U/L (ref 38–126)
ALT: 16 U/L (ref 0–44)
AST: 18 U/L (ref 15–41)
Anion gap: 7 (ref 5–15)
BILIRUBIN TOTAL: 1.5 mg/dL — AB (ref 0.3–1.2)
BUN: 33 mg/dL — AB (ref 6–20)
CALCIUM: 8.6 mg/dL — AB (ref 8.9–10.3)
CO2: 24 mmol/L (ref 22–32)
Chloride: 106 mmol/L (ref 98–111)
Creatinine, Ser: 1.32 mg/dL — ABNORMAL HIGH (ref 0.61–1.24)
GFR calc Af Amer: >60 mL/min (ref >60)
GFR calc non Af Amer: 58 mL/min — ABNORMAL LOW (ref >60)
GLUCOSE: 103 mg/dL — AB (ref 70–99)
Potassium: 4.7 mmol/L (ref 3.5–5.1)
Sodium: 137 mmol/L (ref 135–145)
TOTAL PROTEIN: 8 g/dL (ref 6.5–8.1)

## 2017-11-29 LAB — LACTATE DEHYDROGENASE: LDH: 173 U/L (ref 98–192)

## 2017-11-29 NOTE — Patient Instructions (Signed)
[facility] Discharge Instructions  You saw Dr. Higgs today.   Thank you for choosing [facility] at [facility] to provide your oncology and hematology care.  To afford each patient quality time with our provider, please arrive at least 15 minutes before your scheduled appointment time.   If you have a lab appointment with the Cancer Center please come in thru the  Main Entrance and check in at the main information desk  You need to re-schedule your appointment should you arrive 10 or more minutes late.  We strive to give you quality time with our providers, and arriving late affects you and other patients whose appointments are after yours.  Also, if you no show three or more times for appointments you may be dismissed from the clinic at the providers discretion.     Again, thank you for choosing [facility].  Our hope is that these requests will decrease the amount of time that you wait before being seen by our physicians.       _____________________________________________________________  Should you have questions after your visit to [facility], please contact our office at [phone] between the hours of 8:00 a.m. and 4:30 p.m.  Voicemails left after 4:00 p.m. will not be returned until the following business day.  For prescription refill requests, have your pharmacy contact our office and allow 72 hours.    Cancer Center Support Programs:   > Cancer Support Group  2nd Tuesday of the month 1pm-2pm, Journey Room    

## 2017-11-29 NOTE — Progress Notes (Signed)
Diagnosis Pancytopenia ([place]) - Plan: CBC with Differential/Platelet, Comprehensive metabolic panel, Lactate dehydrogenase  Shortness of breath - Plan: CT CHEST W CONTRAST  Abnormal weight loss - Plan: CT Abdomen Pelvis W Contrast  Generalized abdominal pain - Plan: CBC with Differential/Platelet, Comprehensive metabolic panel, Lactate dehydrogenase  Staging Cancer Staging No matching staging information was found for the patient.  Assessment and Plan:  1.  Thrombocytopenia.   Labs done 11/29/2017 reviewed and showed WBC 3.6 HB 12.2 plts 71,000.  Chemistries WNL with K+ of 4.7 Cr 1.32 and normal LFTs.  Hepatitis panel is negative.  Prior coagulation studies done and show an INR 2.45 with PT of 26.4.  Pt has previously undergone bone marrow aspiration and biopsy was negative on 11/25/2008 without any abnormal cytogentics.  He is currently on coumadin.   Pt will be set up for CT of CAP for evaluation of adenopathy.  If CT negative he will be given option of repeat bone marrow biopsy as last BM was almost 10 years ago.     2.  CVA.  Follow-up with PCP and neurology as recommended.    3.  HTN.  BP is 89/59.  Follow-up with PCP for management.    4.  Hypothyroidism.  Follow-up with PCP for monitoring.    5.  Atrial fibrillation.  Pt is on coumadin.  Follow-up with PCP or cardiology for monitoring.   CURRENT THERAPY: Observation  INTERVAL HISTORY:  Historical data obtained from note dated 05/31/2017.  57  y.o. male previously followed by Dr. Whitney Muse for  pancytopenia with negative bone marrow aspiration and biopsy on 11/25/2008.  On chart review, the patient had a leukopenia and thrombocytopenia in 2009. He started having anemia in 2010.       Current Status:  Pt here today for followup to go over labs.  He is accompanied by caretaker.    Problem List Patient Active Problem List   Diagnosis Date Noted  . Colon cancer screening [Z12.11]   . Diverticulosis of colon without hemorrhage [K57.30]    . Encounter for screening colonoscopy [Z12.11] 03/25/2015  . Left-sided low back pain without sciatica [M54.5] 01/07/2014  . Pancytopenia [facility]) [I71.245] 01/11/2012    Past Medical History Past Medical History:  Diagnosis Date  . Atrial fibrillation ([place])   . CVA (cerebral vascular accident) ([place])   . Hypertension   . Hypothyroidism   . Pancytopenia ([place]) 01/11/2012   Stable  . Thrombocytopenia [facility])     Past Surgical History Past Surgical History:  Procedure Laterality Date  . COLONOSCOPY N/A 04/22/2015   Procedure: COLONOSCOPY;  Surgeon: Daneil Dolin, MD;  [facility];  Service: Endoscopy;  Laterality: N/A;  1245  . HERNIA REPAIR     as child    Family History Family History  Problem Relation Age of Onset  . Cancer Mother   . Colon cancer Unknown        unknown but THINKS his mom may have had     Social History  reports that he has never smoked. He has never used smokeless tobacco. He reports that he does not drink alcohol or use drugs.  Medications  Current Outpatient Medications:  .  acetaminophen (TYLENOL) 325 MG tablet, Take 650 mg by mouth every 6 (six) hours as needed., Disp: , Rfl:  .  Albuterol Sulfate 108 (90 BASE) MCG/ACT AEPB, Inhale into the lungs., Disp: , Rfl:  .  amLODipine (NORVASC) 5 MG tablet, Take 5 mg by mouth daily.  ,  Disp: , Rfl:  .  bisacodyl (DULCOLAX) 5 MG EC tablet, Take 1 tablet (5 mg total) by mouth daily as needed for moderate constipation., Disp: 2 tablet, Rfl: 0 .  carvedilol (COREG) 12.5 MG tablet, Take 12.5 mg by mouth 2 (two) times daily with a meal.  , Disp: , Rfl:  .  furosemide (LASIX) 20 MG tablet, , Disp: , Rfl:  .  levothyroxine (SYNTHROID, LEVOTHROID) 100 MCG tablet, Take 125 mcg by mouth daily. , Disp: , Rfl:  .  levothyroxine (SYNTHROID, LEVOTHROID) 125 MCG tablet, , Disp: , Rfl:  .  lisinopril (PRINIVIL,ZESTRIL) 20 MG tablet, , Disp: , Rfl:  .  loratadine (CLARITIN) 10 MG tablet, Take 10 mg by mouth daily.,  Disp: , Rfl:  .  phosphate laxative (FLEET) 2.7-7.2 GM/15ML solution, Take 15 mLs by mouth once., Disp: 45 mL, Rfl: 0 .  polyethylene glycol-electrolytes (NULYTELY/GOLYTELY) 420 G solution, Take 4,000 mLs by mouth once., Disp: 4000 mL, Rfl: 0 .  potassium chloride SA (K-DUR,KLOR-CON) 20 MEQ tablet, Take 20 mEq by mouth 2 (two) times daily.  , Disp: , Rfl:  .  Psyllium (QC FIBER LAXATIVE PO), Take 625 mg by mouth daily. 2 tabs , Disp: , Rfl:  .  warfarin (COUMADIN) 5 MG tablet, Take 5 mg by mouth daily. 5 mg MWF; 7.5 mg Tuesday, Thurs, Sat, Sun, Disp: , Rfl:   Allergies Patient has no known allergies.  Review of Systems Review of Systems - Oncology ROS negative   Physical Exam  Vitals Wt Readings from Last 3 Encounters:  11/29/17 253 lb 1.6 oz (114.8 kg)  10/05/17 261 lb (118.4 kg)  05/31/17 270 lb 6.4 oz (122.7 kg)   Temp Readings from Last 3 Encounters:  11/29/17 98.4 F (36.9 C) (Oral)  10/19/17 98.1 F (36.7 C)  01/08/16 97.5 F (36.4 C) (Oral)   BP Readings from Last 3 Encounters:  11/29/17 (!) 89/59  10/19/17 (!) 161/79  10/05/17 106/77   Pulse Readings from Last 3 Encounters:  11/29/17 100  10/19/17 83  10/05/17 (!) 106    Constitutional: Well-developed, well-nourished, and in no distress.   HENT: Head: Normocephalic and atraumatic.  Mouth/Throat: No oropharyngeal exudate. Mucosa moist. Eyes: Pupils are equal, round, and reactive to light. Conjunctivae are normal. No scleral icterus.  Neck: Normal range of motion. Neck supple. No JVD present.  Cardiovascular: Normal rate, regular rhythm and normal heart sounds.  Exam reveals no gallop and no friction rub.   No murmur heard. Pulmonary/Chest: Effort normal and breath sounds normal. No respiratory distress. No wheezes.No rales.  Abdominal: Soft. Bowel sounds are normal. No distension. There is no tenderness. There is no guarding.  Musculoskeletal: No edema or tenderness.  Lymphadenopathy: No cervical, axillary  or supraclavicular adenopathy.  Neurological: Alert and oriented to person, place, and time. Evidence of prior stroke with speech.   Skin: Skin is warm and dry. No rash noted. No erythema. No pallor.  Psychiatric: Affect and judgment normal.   Labs Appointment on 11/29/2017  Component Date Value Ref Range Status  . WBC 11/29/2017 3.6* 4.0 - 10.5 K/uL Final  . RBC 11/29/2017 3.82* 4.22 - 5.81 MIL/uL Final  . Hemoglobin 11/29/2017 12.2* 13.0 - 17.0 g/dL Final  . HCT 11/29/2017 36.9* 39.0 - 52.0 % Final  . MCV 11/29/2017 96.6  78.0 - 100.0 fL Final  . MCH 11/29/2017 31.9  26.0 - 34.0 pg Final  . MCHC 11/29/2017 33.1  30.0 - 36.0 g/dL Final  . RDW  11/29/2017 14.9  11.5 - 15.5 % Final  . Platelets 11/29/2017 71* 150 - 400 K/uL Final   Comment: PLATELET COUNT CONFIRMED BY SMEAR SPECIMEN CHECKED FOR CLOTS   . Neutrophils Relative % 11/29/2017 41  % Final  . Neutro Abs 11/29/2017 1.5* 1.7 - 7.7 K/uL Final  . Lymphocytes Relative 11/29/2017 34  % Final  . Lymphs Abs 11/29/2017 1.2  0.7 - 4.0 K/uL Final  . Monocytes Relative 11/29/2017 18  % Final  . Monocytes Absolute 11/29/2017 0.7  0.1 - 1.0 K/uL Final  . Eosinophils Relative 11/29/2017 7  % Final  . Eosinophils Absolute 11/29/2017 0.2  0.0 - 0.7 K/uL Final  . Basophils Relative 11/29/2017 0  % Final  . Basophils Absolute 11/29/2017 0.0  0.0 - 0.1 K/uL Final   Performed at [facility], [address], [place], [place] [address]  . Sodium 11/29/2017 137  135 - 145 mmol/L Final  . Potassium 11/29/2017 4.7  3.5 - 5.1 mmol/L Final  . Chloride 11/29/2017 106  98 - 111 mmol/L Final  . CO2 11/29/2017 24  22 - 32 mmol/L Final  . Glucose, Bld 11/29/2017 103* 70 - 99 mg/dL Final  . BUN 11/29/2017 33* 6 - 20 mg/dL Final  . Creatinine, Ser 11/29/2017 1.32* 0.61 - 1.24 mg/dL Final  . Calcium 11/29/2017 8.6* 8.9 - 10.3 mg/dL Final  . Total Protein 11/29/2017 8.0  6.5 - 8.1 g/dL Final  . Albumin 11/29/2017 3.7  3.5 - 5.0 g/dL Final  . AST  11/29/2017 18  15 - 41 U/L Final  . ALT 11/29/2017 16  0 - 44 U/L Final  . Alkaline Phosphatase 11/29/2017 38  38 - 126 U/L Final  . Total Bilirubin 11/29/2017 1.5* 0.3 - 1.2 mg/dL Final  . GFR calc non Af Amer 11/29/2017 58* >60 mL/min Final  . GFR calc Af Amer 11/29/2017 >60  >60 mL/min Final   Comment: (NOTE) The eGFR has been calculated using the CKD EPI equation. This calculation has not been validated in all clinical situations. eGFR's persistently <60 mL/min signify possible Chronic Kidney Disease.   Georgiann Hahn gap 11/29/2017 7  5 - 15 Final   Performed at [facility], [address] [place], [place] [address]  . LDH 11/29/2017 173  98 - 192 U/L Final   Performed at [facility], [address] [place], [place] [address]     Pathology Orders Placed This Encounter  Procedures  . CT CHEST W CONTRAST    Standing Status:   Future    Standing Expiration Date:   11/29/2018    Order Specific Question:   If indicated for the ordered procedure, I authorize the administration of contrast media per Radiology protocol    Answer:   Yes    Order Specific Question:   Preferred imaging location?    Answer:   [facility]    Order Specific Question:   Radiology Contrast Protocol - do NOT remove file path    Answer:   \\[email or web address]  . CT Abdomen Pelvis W Contrast    Standing Status:   Future    Standing Expiration Date:   11/29/2018    Order Specific Question:   ** REASON FOR EXAM (FREE TEXT)    Answer:   thrombocytopenia    Order Specific Question:   If indicated for the ordered procedure, I authorize the administration of contrast media per Radiology protocol    Answer:   Yes    Order Specific  Question:   Preferred imaging location?    Answer:   [facility]    Order Specific Question:   Is Oral Contrast requested for this exam?    Answer:   Yes, Per Radiology protocol    Order Specific Question:   Radiology Contrast Protocol - do NOT remove  file path    Answer:   \\[email or web address].pdf  . CBC with Differential/Platelet    Standing Status:   Future    Standing Expiration Date:   11/30/2018  . Comprehensive metabolic panel    Standing Status:   Future    Standing Expiration Date:   11/30/2018  . Lactate dehydrogenase    Standing Status:   Future    Standing Expiration Date:   11/30/2018       Zoila Shutter MD

## 2017-12-06 ENCOUNTER — Encounter: Payer: Self-pay | Admitting: Orthopaedic Surgery

## 2017-12-06 ENCOUNTER — Ambulatory Visit (INDEPENDENT_AMBULATORY_CARE_PROVIDER_SITE_OTHER): Payer: Medicare Other | Admitting: Orthopaedic Surgery

## 2017-12-06 VITALS — BP 100/69 | HR 108 | Ht 68.0 in | Wt 250.0 lb

## 2017-12-06 DIAGNOSIS — G8929 Other chronic pain: Secondary | ICD-10-CM | POA: Diagnosis not present

## 2017-12-06 DIAGNOSIS — M25562 Pain in left knee: Secondary | ICD-10-CM | POA: Diagnosis not present

## 2017-12-06 NOTE — Progress Notes (Signed)
CC:  I have pain of my left knee. I would like an injection.  The patient has chronic pain of the left knee.  There is no recent trauma.  There is no redness.  Injections in the past have helped.  The knee has no redness, has an effusion and crepitus present.  ROM of the left knee is 0-100.  Impression:  Chronic knee pain left  Return: 1 month  PROCEDURE NOTE:  The patient requests injections of the left knee, verbal consent was obtained.  The left knee was prepped appropriately after time out was performed.   Sterile technique was observed and injection of 1 cc of Depo-Medrol 40 mg with several cc's of plain xylocaine. Anesthesia was provided by ethyl chloride and a 20-gauge needle was used to inject the knee area. The injection was tolerated well.  A band aid dressing was applied.  The patient was advised to apply ice later today and tomorrow to the injection sight as needed.  Electronically Signed Sanjuana Kava, MD 8/20/20192:37 PM

## 2017-12-12 ENCOUNTER — Ambulatory Visit (HOSPITAL_COMMUNITY)
Admission: RE | Admit: 2017-12-12 | Discharge: 2017-12-12 | Disposition: A | Discharge status: Home / Self Care | Payer: Medicare Other | Source: Ambulatory Visit | Attending: Internal Medicine | Admitting: Internal Medicine

## 2017-12-12 DIAGNOSIS — R634 Abnormal weight loss: Secondary | ICD-10-CM

## 2017-12-12 DIAGNOSIS — R0602 Shortness of breath: Secondary | ICD-10-CM

## 2017-12-13 DIAGNOSIS — I10 Essential (primary) hypertension: Secondary | ICD-10-CM | POA: Diagnosis not present

## 2017-12-13 DIAGNOSIS — Z1211 Encounter for screening for malignant neoplasm of colon: Secondary | ICD-10-CM | POA: Diagnosis not present

## 2017-12-13 DIAGNOSIS — I4891 Unspecified atrial fibrillation: Secondary | ICD-10-CM | POA: Diagnosis not present

## 2017-12-13 DIAGNOSIS — Z7901 Long term (current) use of anticoagulants: Secondary | ICD-10-CM | POA: Diagnosis not present

## 2017-12-13 DIAGNOSIS — E785 Hyperlipidemia, unspecified: Secondary | ICD-10-CM | POA: Diagnosis not present

## 2017-12-13 DIAGNOSIS — R609 Edema, unspecified: Secondary | ICD-10-CM | POA: Diagnosis not present

## 2017-12-13 DIAGNOSIS — I872 Venous insufficiency (chronic) (peripheral): Secondary | ICD-10-CM | POA: Diagnosis not present

## 2017-12-13 DIAGNOSIS — I482 Chronic atrial fibrillation: Secondary | ICD-10-CM | POA: Diagnosis not present

## 2017-12-13 DIAGNOSIS — E039 Hypothyroidism, unspecified: Secondary | ICD-10-CM | POA: Diagnosis not present

## 2017-12-13 DIAGNOSIS — I639 Cerebral infarction, unspecified: Secondary | ICD-10-CM | POA: Diagnosis not present

## 2017-12-15 ENCOUNTER — Ambulatory Visit (HOSPITAL_COMMUNITY)
Admission: RE | Admit: 2017-12-15 | Discharge: 2017-12-15 | Disposition: A | Discharge status: Home / Self Care | Payer: Medicare Other | Source: Ambulatory Visit | Attending: Internal Medicine | Admitting: Internal Medicine

## 2017-12-15 DIAGNOSIS — K449 Diaphragmatic hernia without obstruction or gangrene: Secondary | ICD-10-CM | POA: Insufficient documentation

## 2017-12-15 DIAGNOSIS — R634 Abnormal weight loss: Secondary | ICD-10-CM | POA: Diagnosis not present

## 2017-12-15 DIAGNOSIS — R590 Localized enlarged lymph nodes: Secondary | ICD-10-CM | POA: Insufficient documentation

## 2017-12-15 DIAGNOSIS — R0602 Shortness of breath: Secondary | ICD-10-CM | POA: Diagnosis not present

## 2017-12-15 DIAGNOSIS — R918 Other nonspecific abnormal finding of lung field: Secondary | ICD-10-CM | POA: Diagnosis not present

## 2017-12-15 DIAGNOSIS — K5730 Diverticulosis of large intestine without perforation or abscess without bleeding: Secondary | ICD-10-CM | POA: Insufficient documentation

## 2017-12-15 MED ORDER — IOPAMIDOL (ISOVUE-300) INJECTION 61%
100.0000 mL | Freq: Once | INTRAVENOUS | Status: AC | PRN
  Administered 2017-12-15: 100 mL via INTRAVENOUS

## 2017-12-20 ENCOUNTER — Ambulatory Visit (HOSPITAL_COMMUNITY): Payer: Medicare Other | Admitting: Internal Medicine

## 2017-12-21 ENCOUNTER — Encounter (HOSPITAL_COMMUNITY): Payer: Self-pay | Admitting: Internal Medicine

## 2017-12-21 ENCOUNTER — Inpatient Hospital Stay (HOSPITAL_COMMUNITY): Payer: Medicare Other | Attending: Internal Medicine | Admitting: Internal Medicine

## 2017-12-21 DIAGNOSIS — E039 Hypothyroidism, unspecified: Secondary | ICD-10-CM | POA: Insufficient documentation

## 2017-12-21 DIAGNOSIS — Z8673 Personal history of transient ischemic attack (TIA), and cerebral infarction without residual deficits: Secondary | ICD-10-CM | POA: Insufficient documentation

## 2017-12-21 DIAGNOSIS — D61818 Other pancytopenia: Secondary | ICD-10-CM | POA: Insufficient documentation

## 2017-12-21 DIAGNOSIS — R918 Other nonspecific abnormal finding of lung field: Secondary | ICD-10-CM | POA: Diagnosis not present

## 2017-12-21 DIAGNOSIS — Z7901 Long term (current) use of anticoagulants: Secondary | ICD-10-CM | POA: Diagnosis not present

## 2017-12-21 DIAGNOSIS — I4891 Unspecified atrial fibrillation: Secondary | ICD-10-CM

## 2017-12-21 DIAGNOSIS — I10 Essential (primary) hypertension: Secondary | ICD-10-CM | POA: Insufficient documentation

## 2017-12-21 NOTE — Progress Notes (Signed)
Diagnosis Abnormal findings on diagnostic imaging of lung - Plan: CBC with Differential/Platelet, Comprehensive metabolic panel, Lactate dehydrogenase, CT CHEST W CONTRAST  Staging Cancer Staging No matching staging information was found for the patient.  Assessment and Plan:  1.  Thrombocytopenia.   Labs done 11/29/2017 reviewed and showed WBC 3.6 HB 12.2 plts 71,000.  Chemistries WNL with K+ of 4.7 Cr 1.32 and normal LFTs.  Hepatitis panel is negative.  Prior coagulation studies done and show an INR 2.45 with PT of 26.4.  Pt has previously undergone bone marrow aspiration and biopsy was negative on 11/25/2008 without any abnormal cytogentics.  He is currently on coumadin.   CT of CAP done 12/15/2017 reviewed and showed 1. Multiple nodules involving the LEFT lung, the largest measuring 7 mm. No nodules are identified in the RIGHT lung. Non-contrast chest CT at 3-6 months is recommended. If the nodules are stable at time of repeat CT, then future CT at 18-24 months (from today's scan) is considered optional for low-risk patients, but is recommended for high-risk patients. This recommendation follows the consensus statement: Guidelines for Management of Incidental Pulmonary Nodules Detected on CT Images: From the Fleischner Society 2017; Radiology 2017; 284:228-243. 2. Multiple mildly enlarged mediastinal lymph nodes. Index nodes are measured above. No evidence of hilar, axillary or supraclavicular lymphadenopathy. 3. Marked cardiomegaly with RIGHT atrial and LEFT atrial enlargement in particular. 4. The constellation of findings above may be related to an atypical infection or malignancy. The RIGHT paratracheal lymphadenopathy should be accessible at mediastinoscopy.    5.  Diverticulosis on colon.    Pt lives in a group  facility and I will refer for pulmonary evaluation to rule Out an infectious etiology.  If no findings on pulmonary evaluation will set up for repeat imaging In 04/2018  and consider CT surgery evaluation.    2.  CVA.  Follow-up with PCP and neurology as recommended.    3.  HTN.  BP is 84/58.  Follow-up with PCP.    4.  Atrial fibrillation.  Pt is on coumadin.  Follow-up with PCP or cardiology for monitoring.   5.  Hypothyroidism.  Follow-up with PCP for monitoring.    30 minutes spent with more than 50% spent in counseling and coordination of care.    CURRENT THERAPY: Observation  INTERVAL HISTORY:  Historical data obtained from note dated 05/31/2017.  57  y.o. male previously followed by Dr. Whitney Muse for  pancytopenia with negative bone marrow aspiration and biopsy on 11/25/2008.  On chart review, the patient had a leukopenia and thrombocytopenia in 2009. He started having anemia in 2010.       Current Status:  Pt here today for followup to go over scans.  He is accompanied by caretaker.    Problem List Patient Active Problem List   Diagnosis Date Noted  . Colon cancer screening [Z12.11]   . Diverticulosis of colon without hemorrhage [K57.30]   . Encounter for screening colonoscopy [Z12.11] 03/25/2015  . Left-sided low back pain without sciatica [M54.5] 01/07/2014  . Pancytopenia [facility]) [O96.295] 01/11/2012    Past Medical History Past Medical History:  Diagnosis Date  . Atrial fibrillation ([place])   . CVA (cerebral vascular accident) ([place])   . Hypertension   . Hypothyroidism   . Pancytopenia ([place]) 01/11/2012   Stable  . Thrombocytopenia [facility])     Past Surgical History Past Surgical History:  Procedure Laterality Date  . COLONOSCOPY N/A 04/22/2015   Procedure: COLONOSCOPY;  Surgeon: Daneil Dolin,  MD;  [facility];  Service: Endoscopy;  Laterality: N/A;  1245  . HERNIA REPAIR     as child    Family History Family History  Problem Relation Age of Onset  . Cancer Mother   . Colon cancer Unknown        unknown but THINKS his mom may have had     Social History  reports that he has never smoked. He has never used smokeless  tobacco. He reports that he does not drink alcohol or use drugs.  Medications  Current Outpatient Medications:  .  acetaminophen (TYLENOL) 325 MG tablet, Take 650 mg by mouth every 6 (six) hours as needed., Disp: , Rfl:  .  Albuterol Sulfate 108 (90 BASE) MCG/ACT AEPB, Inhale into the lungs., Disp: , Rfl:  .  amLODipine (NORVASC) 5 MG tablet, Take 5 mg by mouth daily.  , Disp: , Rfl:  .  bisacodyl (DULCOLAX) 5 MG EC tablet, Take 1 tablet (5 mg total) by mouth daily as needed for moderate constipation., Disp: 2 tablet, Rfl: 0 .  carvedilol (COREG) 12.5 MG tablet, Take 12.5 mg by mouth 2 (two) times daily with a meal.  , Disp: , Rfl:  .  furosemide (LASIX) 20 MG tablet, , Disp: , Rfl:  .  levothyroxine (SYNTHROID, LEVOTHROID) 125 MCG tablet, , Disp: , Rfl:  .  lisinopril (PRINIVIL,ZESTRIL) 20 MG tablet, , Disp: , Rfl:  .  loratadine (CLARITIN) 10 MG tablet, Take 10 mg by mouth daily., Disp: , Rfl:  .  phosphate laxative (FLEET) 2.7-7.2 GM/15ML solution, Take 15 mLs by mouth once., Disp: 45 mL, Rfl: 0 .  polyethylene glycol-electrolytes (NULYTELY/GOLYTELY) 420 G solution, Take 4,000 mLs by mouth once., Disp: 4000 mL, Rfl: 0 .  potassium chloride SA (K-DUR,KLOR-CON) 20 MEQ tablet, Take 20 mEq by mouth 2 (two) times daily.  , Disp: , Rfl:  .  Psyllium (QC FIBER LAXATIVE PO), Take 625 mg by mouth daily. 2 tabs , Disp: , Rfl:  .  warfarin (COUMADIN) 5 MG tablet, Take 5 mg by mouth daily. 5 mg MWF; 7.5 mg Tuesday, Thurs, Sat, Sun, Disp: , Rfl:   Allergies Patient has no known allergies.  Review of Systems Review of Systems - Oncology ROS as per HPI otherwise 12 point ROS is negative.   Physical Exam  Vitals Wt Readings from Last 3 Encounters:  12/21/17 248 lb (112.5 kg)  12/06/17 250 lb (113.4 kg)  11/29/17 253 lb 1.6 oz (114.8 kg)   Temp Readings from Last 3 Encounters:  12/21/17 98 F (36.7 C) (Oral)  11/29/17 98.4 F (36.9 C) (Oral)  10/19/17 98.1 F (36.7 C)   BP Readings from  Last 3 Encounters:  12/21/17 (!) 84/58  12/06/17 100/69  11/29/17 (!) 89/59   Pulse Readings from Last 3 Encounters:  12/21/17 83  12/06/17 (!) 108  11/29/17 100   Constitutional: Well-developed, well-nourished, and in no distress.   HENT: Head: Normocephalic and atraumatic.  Mouth/Throat: No oropharyngeal exudate. Mucosa moist. Eyes: Pupils are equal, round, and reactive to light. Conjunctivae are normal. No scleral icterus.  Neck: Normal range of motion. Neck supple. No JVD present.  Cardiovascular: Normal rate, regular rhythm and normal heart sounds.  Exam reveals no gallop and no friction rub.   No murmur heard. Pulmonary/Chest: Effort normal and breath sounds normal. No respiratory distress. No wheezes.No rales.  Abdominal: Soft. Bowel sounds are normal. No distension. There is no tenderness. There is no guarding.  Musculoskeletal: No edema or tenderness.  Lymphadenopathy: No cervical, axillary or supraclavicular adenopathy.  Neurological: Alert and oriented to person, place, and time. No cranial nerve deficit.  Skin: Skin is warm and dry. No rash noted. No erythema. No pallor.  Psychiatric: Affect and judgment normal.   Labs No visits with results within 3 Day(s) from this visit.  Latest known visit with results is:  Appointment on 11/29/2017  Component Date Value Ref Range Status  . WBC 11/29/2017 3.6* 4.0 - 10.5 K/uL Final  . RBC 11/29/2017 3.82* 4.22 - 5.81 MIL/uL Final  . Hemoglobin 11/29/2017 12.2* 13.0 - 17.0 g/dL Final  . HCT 11/29/2017 36.9* 39.0 - 52.0 % Final  . MCV 11/29/2017 96.6  78.0 - 100.0 fL Final  . MCH 11/29/2017 31.9  26.0 - 34.0 pg Final  . MCHC 11/29/2017 33.1  30.0 - 36.0 g/dL Final  . RDW 11/29/2017 14.9  11.5 - 15.5 % Final  . Platelets 11/29/2017 71* 150 - 400 K/uL Final   Comment: PLATELET COUNT CONFIRMED BY SMEAR SPECIMEN CHECKED FOR CLOTS   . Neutrophils Relative % 11/29/2017 41  % Final  . Neutro Abs 11/29/2017 1.5* 1.7 - 7.7 K/uL Final   . Lymphocytes Relative 11/29/2017 34  % Final  . Lymphs Abs 11/29/2017 1.2  0.7 - 4.0 K/uL Final  . Monocytes Relative 11/29/2017 18  % Final  . Monocytes Absolute 11/29/2017 0.7  0.1 - 1.0 K/uL Final  . Eosinophils Relative 11/29/2017 7  % Final  . Eosinophils Absolute 11/29/2017 0.2  0.0 - 0.7 K/uL Final  . Basophils Relative 11/29/2017 0  % Final  . Basophils Absolute 11/29/2017 0.0  0.0 - 0.1 K/uL Final   Performed at [facility], [address], [place], [place] [address]  . Sodium 11/29/2017 137  135 - 145 mmol/L Final  . Potassium 11/29/2017 4.7  3.5 - 5.1 mmol/L Final  . Chloride 11/29/2017 106  98 - 111 mmol/L Final  . CO2 11/29/2017 24  22 - 32 mmol/L Final  . Glucose, Bld 11/29/2017 103* 70 - 99 mg/dL Final  . BUN 11/29/2017 33* 6 - 20 mg/dL Final  . Creatinine, Ser 11/29/2017 1.32* 0.61 - 1.24 mg/dL Final  . Calcium 11/29/2017 8.6* 8.9 - 10.3 mg/dL Final  . Total Protein 11/29/2017 8.0  6.5 - 8.1 g/dL Final  . Albumin 11/29/2017 3.7  3.5 - 5.0 g/dL Final  . AST 11/29/2017 18  15 - 41 U/L Final  . ALT 11/29/2017 16  0 - 44 U/L Final  . Alkaline Phosphatase 11/29/2017 38  38 - 126 U/L Final  . Total Bilirubin 11/29/2017 1.5* 0.3 - 1.2 mg/dL Final  . GFR calc non Af Amer 11/29/2017 58* >60 mL/min Final  . GFR calc Af Amer 11/29/2017 >60  >60 mL/min Final   Comment: (NOTE) The eGFR has been calculated using the CKD EPI equation. This calculation has not been validated in all clinical situations. eGFR's persistently <60 mL/min signify possible Chronic Kidney Disease.   Georgiann Hahn gap 11/29/2017 7  5 - 15 Final   Performed at [facility], [address], [place], [place] [address]  . LDH 11/29/2017 173  98 - 192 U/L Final   Performed at [facility], [address] [place],  [address]     Pathology Orders Placed This Encounter  Procedures  . CT CHEST W CONTRAST    Standing Status:   Future    Standing Expiration Date:   12/21/2018  Order Specific Question:   If  indicated for the ordered procedure, I authorize the administration of contrast media per Radiology protocol    Answer:   Yes    Order Specific Question:   Preferred imaging location?    Answer:   [facility]    Order Specific Question:   Radiology Contrast Protocol - do NOT remove file path    Answer:   [email or web address]  . CBC with Differential/Platelet    Standing Status:   Future    Standing Expiration Date:   12/22/2019  . Comprehensive metabolic panel    Standing Status:   Future    Standing Expiration Date:   12/22/2019  . Lactate dehydrogenase    Standing Status:   Future    Standing Expiration Date:   12/22/2019       Zoila Shutter MD

## 2017-12-21 NOTE — Patient Instructions (Signed)
[facility] at [facility] Discharge Instructions  You saw Dr. Higgs today.   Thank you for choosing [facility] at [facility] to provide your oncology and hematology care.  To afford each patient quality time with our provider, please arrive at least 15 minutes before your scheduled appointment time.   If you have a lab appointment with the Cancer Center please come in thru the  Main Entrance and check in at the main information desk  You need to re-schedule your appointment should you arrive 10 or more minutes late.  We strive to give you quality time with our providers, and arriving late affects you and other patients whose appointments are after yours.  Also, if you no show three or more times for appointments you may be dismissed from the clinic at the providers discretion.     Again, thank you for choosing [facility].  Our hope is that these requests will decrease the amount of time that you wait before being seen by our physicians.       _____________________________________________________________  Should you have questions after your visit to [facility], please contact our office at [phone] between the hours of 8:00 a.m. and 4:30 p.m.  Voicemails left after 4:00 p.m. will not be returned until the following business day.  For prescription refill requests, have your pharmacy contact our office and allow 72 hours.    Cancer Center Support Programs:   > Cancer Support Group  2nd Tuesday of the month 1pm-2pm, Journey Room    

## 2017-12-22 ENCOUNTER — Ambulatory Visit (HOSPITAL_COMMUNITY): Payer: Medicare Other | Admitting: Internal Medicine

## 2017-12-22 ENCOUNTER — Encounter (HOSPITAL_COMMUNITY): Payer: Self-pay | Admitting: Internal Medicine

## 2017-12-22 NOTE — Progress Notes (Unsigned)
Faxed patient records to Dr. Luan Pulling for pulmonology referral.

## 2018-01-03 ENCOUNTER — Ambulatory Visit: Payer: Medicare Other | Admitting: Orthopaedic Surgery

## 2018-01-04 DIAGNOSIS — H2513 Age-related nuclear cataract, bilateral: Secondary | ICD-10-CM | POA: Diagnosis not present

## 2018-01-10 ENCOUNTER — Ambulatory Visit: Payer: Medicare Other | Admitting: Orthopaedic Surgery

## 2018-01-12 ENCOUNTER — Ambulatory Visit (INDEPENDENT_AMBULATORY_CARE_PROVIDER_SITE_OTHER): Payer: Medicare Other | Admitting: Orthopaedic Surgery

## 2018-01-12 ENCOUNTER — Encounter: Payer: Self-pay | Admitting: Orthopaedic Surgery

## 2018-01-12 DIAGNOSIS — M25562 Pain in left knee: Secondary | ICD-10-CM

## 2018-01-12 DIAGNOSIS — G8929 Other chronic pain: Secondary | ICD-10-CM | POA: Diagnosis not present

## 2018-01-12 DIAGNOSIS — M25561 Pain in right knee: Secondary | ICD-10-CM

## 2018-01-12 DIAGNOSIS — I4891 Unspecified atrial fibrillation: Secondary | ICD-10-CM | POA: Diagnosis not present

## 2018-01-12 NOTE — Progress Notes (Signed)
CC: Both of my knees are hurting. I would like an injection in both knees.  The patient has had chronic pain and tenderness of both knees for some time.  Injections help.  There is no locking or giving way of the knee.  There is no new trauma. There is no redness or signs of infections.  The knees have a mild effusion and some crepitus.  There is no redness or signs of recent trauma.  Right knee ROM is 0-100 and left knee ROM is 0-105.  Impression:  Chronic pain of the both knees  Return:  6 weeks  PROCEDURE NOTE:  The patient requests injections of both knees, verbal consent was obtained.  The left and right knee were individually prepped appropriately after time out was performed.   Sterile technique was observed and injection of 1 cc of Depo-Medrol 40 mg with several cc's of plain xylocaine. Anesthesia was provided by ethyl chloride and a 20-gauge needle was used to inject each knee area. The injections were tolerated well.  A band aid dressing was applied.  The patient was advised to apply ice later today and tomorrow to the injection sight as needed.   Electronically Signed Sanjuana Kava, MD 9/26/20192:29 PM

## 2018-01-19 DIAGNOSIS — J984 Other disorders of lung: Secondary | ICD-10-CM | POA: Diagnosis not present

## 2018-01-19 DIAGNOSIS — I10 Essential (primary) hypertension: Secondary | ICD-10-CM | POA: Diagnosis not present

## 2018-01-19 DIAGNOSIS — D696 Thrombocytopenia, unspecified: Secondary | ICD-10-CM | POA: Diagnosis not present

## 2018-01-19 DIAGNOSIS — I4891 Unspecified atrial fibrillation: Secondary | ICD-10-CM | POA: Diagnosis not present

## 2018-01-30 ENCOUNTER — Ambulatory Visit (HOSPITAL_COMMUNITY): Payer: Medicare Other | Admitting: Internal Medicine

## 2018-01-30 ENCOUNTER — Other Ambulatory Visit (HOSPITAL_COMMUNITY): Payer: Medicare Other

## 2018-02-09 ENCOUNTER — Encounter: Payer: Self-pay | Admitting: Podiatry

## 2018-02-09 ENCOUNTER — Ambulatory Visit (INDEPENDENT_AMBULATORY_CARE_PROVIDER_SITE_OTHER): Payer: Medicare Other | Admitting: Podiatry

## 2018-02-09 VITALS — BP 99/62 | HR 89

## 2018-02-09 DIAGNOSIS — B351 Tinea unguium: Secondary | ICD-10-CM | POA: Diagnosis not present

## 2018-02-09 DIAGNOSIS — M79675 Pain in left toe(s): Secondary | ICD-10-CM | POA: Diagnosis not present

## 2018-02-09 DIAGNOSIS — M79674 Pain in right toe(s): Secondary | ICD-10-CM | POA: Diagnosis not present

## 2018-02-09 DIAGNOSIS — R600 Localized edema: Secondary | ICD-10-CM

## 2018-02-09 NOTE — Patient Instructions (Signed)
Edema Edema is when you have too much fluid in your body or under your skin. Edema may make your legs, feet, and ankles swell up. Swelling is also common in looser tissues, like around your eyes. This is a common condition. It gets more common as you get older. There are many possible causes of edema. Eating too much salt (sodium) and being on your feet or sitting for a long time can cause edema in your legs, feet, and ankles. Hot weather may make edema worse. Edema is usually painless. Your skin may look swollen or shiny. Follow these instructions at home:  Keep the swollen body part raised (elevated) above the level of your heart when you are sitting or lying down.  Do not sit still or stand for a long time.  Do not wear tight clothes. Do not wear garters on your upper legs.  Exercise your legs. This can help the swelling go down.  Wear elastic bandages or support stockings as told by your doctor.  Eat a low-salt (low-sodium) diet to reduce fluid as told by your doctor.  Depending on the cause of your swelling, you may need to limit how much fluid you drink (fluid restriction).  Take over-the-counter and prescription medicines only as told by your doctor. Contact a doctor if:  Treatment is not working.  You have heart, liver, or kidney disease and have symptoms of edema.  You have sudden and unexplained weight gain. Get help right away if:  You have shortness of breath or chest pain.  You cannot breathe when you lie down.  You have pain, redness, or warmth in the swollen areas.  You have heart, liver, or kidney disease and get edema all of a sudden.  You have a fever and your symptoms get worse all of a sudden. Summary  Edema is when you have too much fluid in your body or under your skin.  Edema may make your legs, feet, and ankles swell up. Swelling is also common in looser tissues, like around your eyes.  Raise (elevate) the swollen body part above the level of your  heart when you are sitting or lying down.  Follow your doctor's instructions about diet and how much fluid you can drink (fluid restriction). This information is not intended to replace advice given to you by your health care provider. Make sure you discuss any questions you have with your health care provider. Document Released: 09/22/2007 Document Revised: 04/23/2016 Document Reviewed: 04/23/2016 Elsevier Interactive Patient Education  2017 Elsevier Inc. Onychomycosis/Fungal Toenails  WHAT IS IT? An infection that lies within the keratin of your nail plate that is caused by a fungus.  WHY ME? Fungal infections affect all ages, sexes, races, and creeds.  There may be many factors that predispose you to a fungal infection such as age, coexisting medical conditions such as diabetes, or an autoimmune disease; stress, medications, fatigue, genetics, etc.  Bottom line: fungus thrives in a warm, moist environment and your shoes offer such a location.  IS IT CONTAGIOUS? Theoretically, yes.  You do not want to share shoes, nail clippers or files with someone who has fungal toenails.  Walking around barefoot in the same room or sleeping in the same bed is unlikely to transfer the organism.  It is important to realize, however, that fungus can spread easily from one nail to the next on the same foot.  HOW DO WE TREAT THIS?  There are several ways to treat this condition.  Treatment may  depend on many factors such as age, medications, pregnancy, liver and kidney conditions, etc.  It is best to ask your doctor which options are available to you.  1. No treatment.   Unlike many other medical concerns, you can live with this condition.  However for many people this can be a painful condition and may lead to ingrown toenails or a bacterial infection.  It is recommended that you keep the nails cut short to help reduce the amount of fungal nail. 2. Topical treatment.  These range from herbal remedies to  prescription strength nail lacquers.  About 40-50% effective, topicals require twice daily application for approximately 9 to 12 months or until an entirely new nail has grown out.  The most effective topicals are medical grade medications available through physicians offices. 3. Oral antifungal medications.  With an 80-90% cure rate, the most common oral medication requires 3 to 4 months of therapy and stays in your system for a year as the new nail grows out.  Oral antifungal medications do require blood work to make sure it is a safe drug for you.  A liver function panel will be performed prior to starting the medication and after the first month of treatment.  It is important to have the blood work performed to avoid any harmful side effects.  In general, this medication safe but blood work is required. 4. Laser Therapy.  This treatment is performed by applying a specialized laser to the affected nail plate.  This therapy is noninvasive, fast, and non-painful.  It is not covered by insurance and is therefore, out of pocket.  The results have been very good with a 80-95% cure rate.  [facility] is the only practice in the area to offer this therapy. 5. Permanent Nail Avulsion.  Removing the entire nail so that a new nail will not grow back.

## 2018-02-15 DIAGNOSIS — Z7901 Long term (current) use of anticoagulants: Secondary | ICD-10-CM | POA: Diagnosis not present

## 2018-02-23 ENCOUNTER — Ambulatory Visit: Payer: Medicare Other | Admitting: Orthopaedic Surgery

## 2018-02-28 NOTE — Progress Notes (Signed)
Subjective: Bryce Hill presents today with  cc of painful, discolored, thick toenails which interfere with daily activities and routine tasks.  Pain is aggravated when wearing enclosed shoe gear. Pain is getting progressively worse and relieved with periodic professional debridement.  Medical History   01/11/2012 Pancytopenia ([place])   Date Unknown Atrial fibrillation [facility])  Date Unknown CVA (cerebral vascular accident) [facility])  Date Unknown Hypertension  Date Unknown Hypothyroidism  Date Unknown Thrombocytopenia ([place])   Problem List   Digestive  Diverticulosis of colon without hemorrhage   Hematopoietic and Hemostatic  Pancytopenia ([place])   Other  Left-sided low back pain without sciatica   Encounter for screening colonoscopy   Colon cancer screening    Surgical History   04/22/2015 Colonoscopy (N/A)   Date Unknown Hernia repair    Medications    acetaminophen (TYLENOL) 325 MG tablet    Albuterol Sulfate 108 (90 BASE) MCG/ACT AEPB    amLODipine (NORVASC) 5 MG tablet    bisacodyl (DULCOLAX) 5 MG EC tablet    carvedilol (COREG) 12.5 MG tablet    furosemide (LASIX) 20 MG tablet    levothyroxine (SYNTHROID, LEVOTHROID) 125 MCG tablet    lisinopril (PRINIVIL,ZESTRIL) 20 MG tablet    loratadine (CLARITIN) 10 MG tablet    phosphate laxative (FLEET) 2.7-7.2 GM/15ML solution    polyethylene glycol-electrolytes (NULYTELY/GOLYTELY) 420 G solution    potassium chloride SA (K-DUR,KLOR-CON) 20 MEQ tablet    Psyllium (QC FIBER LAXATIVE PO)    warfarin (COUMADIN) 5 MG tablet    Allergies      No Known Allergies   Tobacco History   Smoking Status  Never Smoker  Smokeless Tobacco Status  Never Used   Family History    Mother (Deceased) Cancer         Unknown Colon cancer  (thinks Mom had colon cancer)   Review of systems: Constitutional: Denies chills fatigue fever sweats weight change Eyes: Denies diplopia glare light sensitivity Ears nose mouth throat: Denies vertigo denies  bloody nose rhinitis denies cold sores and snoring Cardiovascular: Denies chest pain tightness Respiratory: Denies difficulty breathing, denies congestion Gastrointestinal: Denies abdominal pain, diarrhea, nausea, vomiting Genitourinary: Denies nocturia, pain on urination, blood in urine Musculoskeletal: Denies cramping, +chronic pain left/right knee Skin: +changes in toenails, denies color change dryness, itchy skin, mole changes, or rash  Neurological: Denies fainting, denies seizure, denies change in speech.  Positive for headaches periodically Endocrine: Denies dry mouth, denies flushing, denies heat intolerance, denies cold intolerance, denies excessive thirst, denies polyuria, denies nocturia Hematological: +on long-term blood thinner, Denies easy bleeding, excessive bleeding, easy bruising, denies enlarged lymph nodes Allergy/immunological: Denies hives denies frequent infections  Objective: Vitals:   02/09/18 1356  BP: 99/62  Pulse: 89   Vascular Examination: Capillary refill time <3 seconds x 10 digits Dorsalis pedis 2/4 b/l Posterior tibial pulses 1/4 b/l No digital hair x 10 digits Edema noted b/l ankles  Skin temperature warm to warm b/l  Dermatological Examination: Skin with normal turgor, texture and tone b/l Toenails 1-5 b/l discolored, thick, dystrophic with subungual debris and pain with palpation to nailbeds due to thickness of nails.  Musculoskeletal: Muscle strength 5/5 to all LE muscle groups  Neurological: Sensation intact with 10 gram monofilament. Vibratory sensation intact.  Assessment: 1. Painful onychomycosis toenails 1-5 b/l   Plan: 1. Toenails 1-5 b/l were debrided in length and girth without iatrogenic bleeding. 2. Discussed edema and compression stocking therapy. He would like to think about it. 3. Patient to continue  soft, supportive shoe gear 4. Patient to report any pedal injuries to medical professional immediately. 5. Follow up 3  months. Patient/POA to call should there be a concern in the interim.

## 2018-03-02 ENCOUNTER — Encounter: Payer: Self-pay | Admitting: Orthopaedic Surgery

## 2018-03-02 ENCOUNTER — Ambulatory Visit (INDEPENDENT_AMBULATORY_CARE_PROVIDER_SITE_OTHER): Payer: Medicare Other | Admitting: Orthopaedic Surgery

## 2018-03-02 VITALS — BP 106/64 | HR 73 | Ht 69.0 in | Wt 250.0 lb

## 2018-03-02 DIAGNOSIS — M25562 Pain in left knee: Secondary | ICD-10-CM

## 2018-03-02 DIAGNOSIS — G8929 Other chronic pain: Secondary | ICD-10-CM | POA: Diagnosis not present

## 2018-03-02 NOTE — Progress Notes (Signed)
CC:  I have pain of my left knee. I would like an injection.  The patient has chronic pain of the left knee.  There is no recent trauma.  There is no redness.  Injections in the past have helped.  The knee has no redness, has an effusion and crepitus present.  ROM of the left knee is 0-95.  Impression:  Chronic knee pain left  Return: 6 weeks  PROCEDURE NOTE:  The patient requests injections of the left knee, verbal consent was obtained.  The left knee was prepped appropriately after time out was performed.   Sterile technique was observed and injection of 1 cc of Depo-Medrol 40 mg with several cc's of plain xylocaine. Anesthesia was provided by ethyl chloride and a 20-gauge needle was used to inject the knee area. The injection was tolerated well.  A band aid dressing was applied.  The patient was advised to apply ice later today and tomorrow to the injection sight as needed.  Forms for rest home completed.  Electronically Signed Sanjuana Kava, MD 11/14/20191:52 PM

## 2018-03-03 DIAGNOSIS — D649 Anemia, unspecified: Secondary | ICD-10-CM | POA: Diagnosis not present

## 2018-03-03 DIAGNOSIS — I10 Essential (primary) hypertension: Secondary | ICD-10-CM | POA: Diagnosis not present

## 2018-03-03 DIAGNOSIS — Z0000 Encounter for general adult medical examination without abnormal findings: Secondary | ICD-10-CM | POA: Diagnosis not present

## 2018-03-03 DIAGNOSIS — I480 Paroxysmal atrial fibrillation: Secondary | ICD-10-CM | POA: Diagnosis not present

## 2018-03-03 DIAGNOSIS — E039 Hypothyroidism, unspecified: Secondary | ICD-10-CM | POA: Diagnosis not present

## 2018-03-03 DIAGNOSIS — R739 Hyperglycemia, unspecified: Secondary | ICD-10-CM | POA: Diagnosis not present

## 2018-03-03 DIAGNOSIS — D61818 Other pancytopenia: Secondary | ICD-10-CM | POA: Diagnosis not present

## 2018-03-03 DIAGNOSIS — I6350 Cerebral infarction due to unspecified occlusion or stenosis of unspecified cerebral artery: Secondary | ICD-10-CM | POA: Diagnosis not present

## 2018-03-03 DIAGNOSIS — R789 Finding of unspecified substance, not normally found in blood: Secondary | ICD-10-CM | POA: Diagnosis not present

## 2018-03-03 DIAGNOSIS — Z23 Encounter for immunization: Secondary | ICD-10-CM | POA: Diagnosis not present

## 2018-03-08 DIAGNOSIS — Z7901 Long term (current) use of anticoagulants: Secondary | ICD-10-CM | POA: Diagnosis not present

## 2018-03-08 DIAGNOSIS — I4891 Unspecified atrial fibrillation: Secondary | ICD-10-CM | POA: Diagnosis not present

## 2018-03-08 DIAGNOSIS — I10 Essential (primary) hypertension: Secondary | ICD-10-CM | POA: Diagnosis not present

## 2018-04-13 DIAGNOSIS — Z7901 Long term (current) use of anticoagulants: Secondary | ICD-10-CM | POA: Diagnosis not present

## 2018-04-20 ENCOUNTER — Encounter: Payer: Self-pay | Admitting: Orthopaedic Surgery

## 2018-04-20 ENCOUNTER — Ambulatory Visit: Payer: Medicare Other | Admitting: Orthopaedic Surgery

## 2018-04-21 ENCOUNTER — Ambulatory Visit (HOSPITAL_COMMUNITY): Payer: Medicare Other

## 2018-04-21 ENCOUNTER — Other Ambulatory Visit (HOSPITAL_COMMUNITY): Payer: Medicare Other

## 2018-04-28 ENCOUNTER — Ambulatory Visit (HOSPITAL_COMMUNITY): Payer: Medicare Other | Admitting: Internal Medicine

## 2018-04-28 ENCOUNTER — Other Ambulatory Visit (HOSPITAL_COMMUNITY): Payer: Medicare Other

## 2018-05-01 ENCOUNTER — Ambulatory Visit (HOSPITAL_COMMUNITY): Payer: Medicare Other | Admitting: Internal Medicine

## 2018-05-15 ENCOUNTER — Ambulatory Visit: Payer: Medicare Other | Admitting: Podiatry

## 2018-05-16 ENCOUNTER — Encounter: Payer: Self-pay | Admitting: Orthopaedic Surgery

## 2018-05-16 ENCOUNTER — Ambulatory Visit (INDEPENDENT_AMBULATORY_CARE_PROVIDER_SITE_OTHER): Payer: Medicare Other | Admitting: Orthopaedic Surgery

## 2018-05-16 VITALS — BP 105/79 | HR 88 | Ht 69.0 in | Wt 258.0 lb

## 2018-05-16 DIAGNOSIS — M25562 Pain in left knee: Secondary | ICD-10-CM

## 2018-05-16 DIAGNOSIS — G8929 Other chronic pain: Secondary | ICD-10-CM

## 2018-05-16 NOTE — Progress Notes (Signed)
CC:  My knee is better  His left knee is not hurting.  He still has popping and swelling but no pain now.  The injection really helped.  I will see him as needed.  Encounter Diagnosis  Name Primary?  . Chronic pain of left knee Yes   Electronically Signed Sanjuana Kava, MD 1/28/20203:27 PM

## 2018-05-18 DIAGNOSIS — Z7901 Long term (current) use of anticoagulants: Secondary | ICD-10-CM | POA: Diagnosis not present

## 2018-05-18 DIAGNOSIS — E039 Hypothyroidism, unspecified: Secondary | ICD-10-CM | POA: Diagnosis not present

## 2018-05-18 DIAGNOSIS — I4891 Unspecified atrial fibrillation: Secondary | ICD-10-CM | POA: Diagnosis not present

## 2018-05-18 DIAGNOSIS — I10 Essential (primary) hypertension: Secondary | ICD-10-CM | POA: Diagnosis not present

## 2018-05-18 DIAGNOSIS — I872 Venous insufficiency (chronic) (peripheral): Secondary | ICD-10-CM | POA: Diagnosis not present

## 2018-05-24 ENCOUNTER — Inpatient Hospital Stay (HOSPITAL_COMMUNITY): Payer: Medicare Other | Attending: Hematology

## 2018-05-24 ENCOUNTER — Ambulatory Visit (HOSPITAL_COMMUNITY)
Admission: RE | Admit: 2018-05-24 | Discharge: 2018-05-24 | Disposition: A | Discharge status: Home / Self Care | Payer: Medicare Other | Source: Ambulatory Visit | Attending: Internal Medicine | Admitting: Internal Medicine

## 2018-05-24 DIAGNOSIS — E039 Hypothyroidism, unspecified: Secondary | ICD-10-CM | POA: Insufficient documentation

## 2018-05-24 DIAGNOSIS — Z8673 Personal history of transient ischemic attack (TIA), and cerebral infarction without residual deficits: Secondary | ICD-10-CM | POA: Diagnosis not present

## 2018-05-24 DIAGNOSIS — R918 Other nonspecific abnormal finding of lung field: Secondary | ICD-10-CM | POA: Insufficient documentation

## 2018-05-24 DIAGNOSIS — Z7901 Long term (current) use of anticoagulants: Secondary | ICD-10-CM | POA: Insufficient documentation

## 2018-05-24 DIAGNOSIS — I4891 Unspecified atrial fibrillation: Secondary | ICD-10-CM | POA: Diagnosis not present

## 2018-05-24 DIAGNOSIS — D696 Thrombocytopenia, unspecified: Secondary | ICD-10-CM | POA: Insufficient documentation

## 2018-05-24 DIAGNOSIS — Z79899 Other long term (current) drug therapy: Secondary | ICD-10-CM | POA: Insufficient documentation

## 2018-05-24 DIAGNOSIS — D61818 Other pancytopenia: Secondary | ICD-10-CM | POA: Diagnosis not present

## 2018-05-24 DIAGNOSIS — I10 Essential (primary) hypertension: Secondary | ICD-10-CM | POA: Insufficient documentation

## 2018-05-24 LAB — COMPREHENSIVE METABOLIC PANEL
ALBUMIN: 3.5 g/dL (ref 3.5–5.0)
ALK PHOS: 33 U/L — AB (ref 38–126)
ALT: 15 U/L (ref 0–44)
AST: 18 U/L (ref 15–41)
Anion gap: 3 — ABNORMAL LOW (ref 5–15)
BUN: 19 mg/dL (ref 6–20)
CALCIUM: 8.2 mg/dL — AB (ref 8.9–10.3)
CHLORIDE: 108 mmol/L (ref 98–111)
CO2: 26 mmol/L (ref 22–32)
CREATININE: 0.78 mg/dL (ref 0.61–1.24)
GFR calc Af Amer: >60 mL/min (ref >60)
GFR calc non Af Amer: >60 mL/min (ref >60)
GLUCOSE: 81 mg/dL (ref 70–99)
Potassium: 4.2 mmol/L (ref 3.5–5.1)
SODIUM: 137 mmol/L (ref 135–145)
Total Bilirubin: 1.5 mg/dL — ABNORMAL HIGH (ref 0.3–1.2)
Total Protein: 7.3 g/dL (ref 6.5–8.1)

## 2018-05-24 LAB — CBC WITH DIFFERENTIAL/PLATELET
Abs Immature Granulocytes: 0 10*3/uL (ref 0.00–0.07)
BASOS PCT: 0 %
Basophils Absolute: 0 10*3/uL (ref 0.0–0.1)
Eosinophils Absolute: 0.2 10*3/uL (ref 0.0–0.5)
Eosinophils Relative: 5 %
HCT: 35.4 % — ABNORMAL LOW (ref 39.0–52.0)
HEMOGLOBIN: 11 g/dL — AB (ref 13.0–17.0)
IMMATURE GRANULOCYTES: 0 %
LYMPHS PCT: 26 %
Lymphs Abs: 0.7 10*3/uL (ref 0.7–4.0)
MCH: 31.9 pg (ref 26.0–34.0)
MCHC: 31.1 g/dL (ref 30.0–36.0)
MCV: 102.6 fL — ABNORMAL HIGH (ref 80.0–100.0)
MONO ABS: 0.4 10*3/uL (ref 0.1–1.0)
Monocytes Relative: 15 %
NEUTROS PCT: 54 %
Neutro Abs: 1.5 10*3/uL — ABNORMAL LOW (ref 1.7–7.7)
PLATELETS: 55 10*3/uL — AB (ref 150–400)
RBC: 3.45 MIL/uL — AB (ref 4.22–5.81)
RDW: 14.5 % (ref 11.5–15.5)
WBC: 2.8 10*3/uL — AB (ref 4.0–10.5)
nRBC: 0 % (ref 0.0–0.2)

## 2018-05-24 LAB — POCT I-STAT CREATININE: Creatinine, Ser: 0.8 mg/dL (ref 0.61–1.24)

## 2018-05-24 LAB — LACTATE DEHYDROGENASE: LDH: 166 U/L (ref 98–192)

## 2018-05-24 MED ORDER — IOHEXOL 300 MG/ML  SOLN
75.0000 mL | Freq: Once | INTRAMUSCULAR | Status: AC | PRN
  Administered 2018-05-24: 75 mL via INTRAVENOUS

## 2018-05-30 ENCOUNTER — Ambulatory Visit (HOSPITAL_COMMUNITY): Payer: Medicare Other | Admitting: Internal Medicine

## 2018-06-06 ENCOUNTER — Other Ambulatory Visit: Payer: Self-pay

## 2018-06-06 ENCOUNTER — Inpatient Hospital Stay (HOSPITAL_BASED_OUTPATIENT_CLINIC_OR_DEPARTMENT_OTHER): Payer: Medicare Other | Admitting: Internal Medicine

## 2018-06-06 ENCOUNTER — Ambulatory Visit (HOSPITAL_COMMUNITY): Payer: Medicare Other | Admitting: Internal Medicine

## 2018-06-06 ENCOUNTER — Encounter (HOSPITAL_COMMUNITY): Payer: Self-pay | Admitting: Internal Medicine

## 2018-06-06 VITALS — BP 90/55 | HR 73 | Temp 98.1°F | Resp 18 | Wt 255.0 lb

## 2018-06-06 DIAGNOSIS — H18413 Arcus senilis, bilateral: Secondary | ICD-10-CM | POA: Diagnosis not present

## 2018-06-06 DIAGNOSIS — D696 Thrombocytopenia, unspecified: Secondary | ICD-10-CM | POA: Diagnosis not present

## 2018-06-06 DIAGNOSIS — D61818 Other pancytopenia: Secondary | ICD-10-CM | POA: Diagnosis not present

## 2018-06-06 DIAGNOSIS — E039 Hypothyroidism, unspecified: Secondary | ICD-10-CM

## 2018-06-06 DIAGNOSIS — R918 Other nonspecific abnormal finding of lung field: Secondary | ICD-10-CM | POA: Diagnosis not present

## 2018-06-06 DIAGNOSIS — H43813 Vitreous degeneration, bilateral: Secondary | ICD-10-CM | POA: Diagnosis not present

## 2018-06-06 DIAGNOSIS — I4891 Unspecified atrial fibrillation: Secondary | ICD-10-CM

## 2018-06-06 DIAGNOSIS — Z7901 Long term (current) use of anticoagulants: Secondary | ICD-10-CM

## 2018-06-06 DIAGNOSIS — Z8673 Personal history of transient ischemic attack (TIA), and cerebral infarction without residual deficits: Secondary | ICD-10-CM | POA: Diagnosis not present

## 2018-06-06 DIAGNOSIS — H2513 Age-related nuclear cataract, bilateral: Secondary | ICD-10-CM | POA: Diagnosis not present

## 2018-06-06 DIAGNOSIS — H35033 Hypertensive retinopathy, bilateral: Secondary | ICD-10-CM | POA: Diagnosis not present

## 2018-06-06 DIAGNOSIS — I480 Paroxysmal atrial fibrillation: Secondary | ICD-10-CM | POA: Diagnosis not present

## 2018-06-06 DIAGNOSIS — I10 Essential (primary) hypertension: Secondary | ICD-10-CM | POA: Diagnosis not present

## 2018-06-06 NOTE — Progress Notes (Signed)
Diagnosis Pancytopenia ([place]) - Plan: CBC with Differential, Sedimentation rate, C-reactive protein  Staging Cancer Staging No matching staging information was found for the patient.  Assessment and Plan:  1.  Thrombocytopenia.   Labs done 11/29/2017 showed WBC 3.6 HB 12.2 plts 71,000.  Chemistries WNL with K+ of 4.7 Cr 1.32 and normal LFTs.  Hepatitis panel is negative.  Prior coagulation studies done and show an INR 2.45 with PT of 26.4.  Pt has previously undergone bone marrow aspiration and biopsy was negative on 11/25/2008 without any abnormal cytogentics.  He is currently on coumadin.   Labs done 05/24/2018 reviewed and showed WBC 2.8 HB 11 plts 55,000.  Chemistries WNL with K+ 4.2 Cr 0.78 and normal LFTs.  LDH normal at 166.  Pt has some abnormal scan findings and would like pulmonary evaluation done prior to recommending for steroids if plt count worsens.  Pt will RTC in 2 weeks for repeat labs and will be seen for follow-up in 1 month with labs.    2.  Abnormal scan.  Pt had CT of CAP done 12/15/2017 showed 1. Multiple nodules involving the LEFT lung, the largest measuring 7 mm. No nodules are identified in the RIGHT lung. Non-contrast chest CT at 3-6 months is recommended. If the nodules are stable at time of repeat CT, then future CT at 18-24 months (from today's scan) is considered optional for low-risk patients, but is recommended for high-risk patients. This recommendation follows the consensus statement: Guidelines for Management of Incidental Pulmonary Nodules Detected on CT Images: From the Fleischner Society 2017; Radiology 2017; 284:228-243. 2. Multiple mildly enlarged mediastinal lymph nodes. Index nodes are measured above. No evidence of hilar, axillary or supraclavicular lymphadenopathy. 3. Marked cardiomegaly with RIGHT atrial and LEFT atrial enlargement in particular. 4. The constellation of findings above may be related to an atypical infection or malignancy. The RIGHT  paratracheal lymphadenopathy should be accessible at mediastinoscopy.    5.  Diverticulosis on colon.    Pt lives in a group  facility and he was referred for pulmonary evaluation to rule Out an infectious etiology.    Pt has not seen pulmonary as previously recommended.    CT Chest done 05/24/2018 reviewed and showed IMPRESSION: 1. Scattered solid pulmonary nodules in the left lung, largest 7 mm, all stable since 12/15/2016 chest CT, probably benign. Follow-up noncontrast chest CT in 12-18 months is considered optional for low-risk patients, but is recommended for high-risk patients. This recommendation follows the consensus statement: Guidelines for Management of Incidental Pulmonary Nodules Detected on CT Images: From the Fleischner Society 2017; Radiology 2017; 284:228-243. 2. Stable moderate cardiomegaly. Mild interlobular septal thickening in the lower lungs suggests mild pulmonary edema. 3. Trace dependent right pleural effusion. 4. Stable mild mediastinal and bilateral hilar lymphadenopathy most compatible with benign reactive adenopathy.  I have again referred pt to pulmonary for evaluation of lung findings.   He will RTC in 2 weeks with repeat labs and will be seen for follow-up in  1 month.    3.  CVA.  Follow-up with PCP and neurology as recommended.    4.  HTN.  BP is 90/53.  Follow-up with PCP.    5.  Atrial fibrillation.  Pt is on coumadin.  Follow-up with PCP or cardiology for monitoring.   6.  Hypothyroidism.  Follow-up with PCP for monitoring.    7.  Family history of colon cancer.  Pt had colonoscopy done 04/22/2015 that was negative other than diverticulosis.  Follow-up  with GI as recommended.    25  minutes spent with more than 50% spent in counseling and coordination of care.    CURRENT THERAPY: Observation  INTERVAL HISTORY:  Historical data obtained from note dated 05/31/2017.  58  y.o. male previously followed by Dr. Whitney Muse for  pancytopenia with negative  bone marrow aspiration and biopsy on 11/25/2008.  On chart review, the patient had a leukopenia and thrombocytopenia in 2009. He started having anemia in 2010.       Current Status:  Pt here today for followup to go over scans.  He is accompanied by caretaker.  He has not gone for pulmonary evaluation as previously recommended.    Problem List Patient Active Problem List   Diagnosis Date Noted  . Colon cancer screening [Z12.11]   . Diverticulosis of colon without hemorrhage [K57.30]   . Encounter for screening colonoscopy [Z12.11] 03/25/2015  . Left-sided low back pain without sciatica [M54.5] 01/07/2014  . Pancytopenia [facility]) [B51.025] 01/11/2012    Past Medical History Past Medical History:  Diagnosis Date  . Atrial fibrillation ([place])   . CVA (cerebral vascular accident) ([place])   . Hypertension   . Hypothyroidism   . Pancytopenia ([place]) 01/11/2012   Stable  . Thrombocytopenia [facility])     Past Surgical History Past Surgical History:  Procedure Laterality Date  . COLONOSCOPY N/A 04/22/2015   Procedure: COLONOSCOPY;  Surgeon: Daneil Dolin, MD;  Location: [facility];  Service: Endoscopy;  Laterality: N/A;  1245  . HERNIA REPAIR     as child    Family History Family History  Problem Relation Age of Onset  . Cancer Mother   . Colon cancer Unknown        unknown but THINKS his mom may have had     Social History  reports that he has never smoked. He has never used smokeless tobacco. He reports that he does not drink alcohol or use drugs.  Medications  Current Outpatient Medications:  .  acetaminophen (TYLENOL) 325 MG tablet, Take 650 mg by mouth every 6 (six) hours as needed., Disp: , Rfl:  .  carvedilol (COREG) 12.5 MG tablet, Take 12.5 mg by mouth 2 (two) times daily with a meal.  , Disp: , Rfl:  .  furosemide (LASIX) 20 MG tablet, , Disp: , Rfl:  .  levothyroxine (SYNTHROID, LEVOTHROID) 125 MCG tablet, , Disp: , Rfl:  .  lisinopril (PRINIVIL,ZESTRIL) 20 MG tablet, ,  Disp: , Rfl:  .  loratadine (CLARITIN) 10 MG tablet, Take 10 mg by mouth daily., Disp: , Rfl:  .  polyethylene glycol-electrolytes (NULYTELY/GOLYTELY) 420 G solution, Take 4,000 mLs by mouth once., Disp: 4000 mL, Rfl: 0 .  potassium chloride SA (K-DUR,KLOR-CON) 20 MEQ tablet, Take 20 mEq by mouth 2 (two) times daily.  , Disp: , Rfl:  .  VENTOLIN HFA 108 (90 Base) MCG/ACT inhaler, , Disp: , Rfl:  .  warfarin (COUMADIN) 5 MG tablet, Take 5 mg by mouth daily. 5 mg MWF; 7.5 mg Tuesday, Thurs, Sat, Sun, Disp: , Rfl:   Allergies Patient has no known allergies.  Review of Systems Review of Systems - Oncology ROS negative   Physical Exam  Vitals Wt Readings from Last 3 Encounters:  06/06/18 255 lb (115.7 kg)  05/16/18 258 lb (117 kg)  03/02/18 250 lb (113.4 kg)   Temp Readings from Last 3 Encounters:  06/06/18 98.1 F (36.7 C) (Oral)  12/21/17 98 F (36.7 C) (Oral)  11/29/17  98.4 F (36.9 C) (Oral)   BP Readings from Last 3 Encounters:  06/06/18 (!) 90/55  05/16/18 105/79  03/02/18 106/64   Pulse Readings from Last 3 Encounters:  06/06/18 73  05/16/18 88  03/02/18 73    Constitutional: Well-developed, well-nourished, and in no distress.   HENT: Head: Normocephalic and atraumatic.  Mouth/Throat: No oropharyngeal exudate. Mucosa moist. Eyes: Pupils are equal, round, and reactive to light. Conjunctivae are normal. No scleral icterus.  Neck: Normal range of motion. Neck supple. No JVD present.  Cardiovascular: Normal rate, regular rhythm and normal heart sounds.  Exam reveals no gallop and no friction rub.   No murmur heard. Pulmonary/Chest: Effort normal.  Coarse BS with few wheezes noted.   Abdominal: Soft. Bowel sounds are normal. No distension. There is no tenderness. There is no guarding.  Musculoskeletal: No edema or tenderness.  Lymphadenopathy: No cervical, axillary or supraclavicular adenopathy.  Neurological: Alert and oriented to person, place, and time. No cranial  nerve deficit.  Skin: Skin is warm and dry. No rash noted. No erythema. No pallor.  Psychiatric: Affect and judgment normal.   Labs No visits with results within 3 Day(s) from this visit.  Latest known visit with results is:  Hospital Outpatient Visit on 05/24/2018  Component Date Value Ref Range Status  . Creatinine, Ser 05/24/2018 0.80  0.61 - 1.24 mg/dL Final     Pathology Orders Placed This Encounter  Procedures  . CBC with Differential    Standing Status:   Future    Standing Expiration Date:   06/07/2019  . Sedimentation rate    Standing Status:   Future    Standing Expiration Date:   06/07/2019  . C-reactive protein    Standing Status:   Future    Standing Expiration Date:   06/07/2019       Zoila Shutter MD

## 2018-06-15 DIAGNOSIS — Z7901 Long term (current) use of anticoagulants: Secondary | ICD-10-CM | POA: Diagnosis not present

## 2018-06-15 DIAGNOSIS — I10 Essential (primary) hypertension: Secondary | ICD-10-CM | POA: Diagnosis not present

## 2018-06-15 DIAGNOSIS — I4891 Unspecified atrial fibrillation: Secondary | ICD-10-CM | POA: Diagnosis not present

## 2018-06-20 ENCOUNTER — Other Ambulatory Visit (HOSPITAL_COMMUNITY): Payer: Medicare Other

## 2018-06-23 ENCOUNTER — Inpatient Hospital Stay (HOSPITAL_COMMUNITY): Payer: Medicare Other | Attending: Hematology

## 2018-06-23 ENCOUNTER — Other Ambulatory Visit: Payer: Self-pay

## 2018-06-23 DIAGNOSIS — Z7901 Long term (current) use of anticoagulants: Secondary | ICD-10-CM | POA: Diagnosis not present

## 2018-06-23 DIAGNOSIS — R918 Other nonspecific abnormal finding of lung field: Secondary | ICD-10-CM | POA: Diagnosis not present

## 2018-06-23 DIAGNOSIS — D696 Thrombocytopenia, unspecified: Secondary | ICD-10-CM | POA: Insufficient documentation

## 2018-06-23 DIAGNOSIS — E039 Hypothyroidism, unspecified: Secondary | ICD-10-CM | POA: Diagnosis not present

## 2018-06-23 DIAGNOSIS — D61818 Other pancytopenia: Secondary | ICD-10-CM

## 2018-06-23 DIAGNOSIS — I4891 Unspecified atrial fibrillation: Secondary | ICD-10-CM | POA: Diagnosis not present

## 2018-06-23 LAB — CBC WITH DIFFERENTIAL/PLATELET
ABS IMMATURE GRANULOCYTES: 0.03 10*3/uL (ref 0.00–0.07)
BASOS ABS: 0 10*3/uL (ref 0.0–0.1)
Basophils Relative: 1 %
Eosinophils Absolute: 0.2 10*3/uL (ref 0.0–0.5)
Eosinophils Relative: 6 %
HCT: 38.3 % — ABNORMAL LOW (ref 39.0–52.0)
Hemoglobin: 12.2 g/dL — ABNORMAL LOW (ref 13.0–17.0)
Immature Granulocytes: 1 %
Lymphocytes Relative: 31 %
Lymphs Abs: 1 10*3/uL (ref 0.7–4.0)
MCH: 32.6 pg (ref 26.0–34.0)
MCHC: 31.9 g/dL (ref 30.0–36.0)
MCV: 102.4 fL — ABNORMAL HIGH (ref 80.0–100.0)
MONOS PCT: 15 %
Monocytes Absolute: 0.5 10*3/uL (ref 0.1–1.0)
NEUTROS ABS: 1.5 10*3/uL — AB (ref 1.7–7.7)
NRBC: 0 % (ref 0.0–0.2)
Neutrophils Relative %: 46 %
Platelets: 78 10*3/uL — ABNORMAL LOW (ref 150–400)
RBC: 3.74 MIL/uL — ABNORMAL LOW (ref 4.22–5.81)
RDW: 13.6 % (ref 11.5–15.5)
WBC: 3.1 10*3/uL — ABNORMAL LOW (ref 4.0–10.5)

## 2018-06-23 LAB — SEDIMENTATION RATE: SED RATE: 62 mm/h — AB (ref 0–16)

## 2018-06-23 LAB — C-REACTIVE PROTEIN

## 2018-06-26 DIAGNOSIS — M2041 Other hammer toe(s) (acquired), right foot: Secondary | ICD-10-CM | POA: Diagnosis not present

## 2018-06-26 DIAGNOSIS — I739 Peripheral vascular disease, unspecified: Secondary | ICD-10-CM | POA: Diagnosis not present

## 2018-06-26 DIAGNOSIS — Z7901 Long term (current) use of anticoagulants: Secondary | ICD-10-CM | POA: Diagnosis not present

## 2018-06-26 DIAGNOSIS — L84 Corns and callosities: Secondary | ICD-10-CM | POA: Diagnosis not present

## 2018-06-26 DIAGNOSIS — M79675 Pain in left toe(s): Secondary | ICD-10-CM | POA: Diagnosis not present

## 2018-06-26 DIAGNOSIS — B351 Tinea unguium: Secondary | ICD-10-CM | POA: Diagnosis not present

## 2018-06-28 ENCOUNTER — Other Ambulatory Visit (HOSPITAL_COMMUNITY): Payer: Self-pay | Admitting: Pulmonary Disease

## 2018-06-28 DIAGNOSIS — R911 Solitary pulmonary nodule: Secondary | ICD-10-CM

## 2018-07-05 ENCOUNTER — Other Ambulatory Visit: Payer: Self-pay

## 2018-07-05 ENCOUNTER — Encounter (HOSPITAL_COMMUNITY): Payer: Self-pay

## 2018-07-05 ENCOUNTER — Ambulatory Visit (HOSPITAL_COMMUNITY)
Admission: RE | Admit: 2018-07-05 | Discharge: 2018-07-05 | Disposition: A | Discharge status: Home / Self Care | Payer: Medicare Other | Source: Ambulatory Visit | Attending: Pulmonary Disease | Admitting: Pulmonary Disease

## 2018-07-05 DIAGNOSIS — R911 Solitary pulmonary nodule: Secondary | ICD-10-CM

## 2018-07-10 ENCOUNTER — Other Ambulatory Visit (HOSPITAL_COMMUNITY): Payer: Self-pay | Admitting: Nurse Practitioner

## 2018-07-10 DIAGNOSIS — D61818 Other pancytopenia: Secondary | ICD-10-CM

## 2018-07-11 ENCOUNTER — Inpatient Hospital Stay (HOSPITAL_COMMUNITY): Payer: Medicare Other

## 2018-07-11 ENCOUNTER — Other Ambulatory Visit: Payer: Self-pay

## 2018-07-11 ENCOUNTER — Inpatient Hospital Stay (HOSPITAL_BASED_OUTPATIENT_CLINIC_OR_DEPARTMENT_OTHER): Payer: Medicare Other | Admitting: Nurse Practitioner

## 2018-07-11 ENCOUNTER — Encounter (HOSPITAL_COMMUNITY): Payer: Self-pay | Admitting: Nurse Practitioner

## 2018-07-11 DIAGNOSIS — Z7901 Long term (current) use of anticoagulants: Secondary | ICD-10-CM

## 2018-07-11 DIAGNOSIS — D696 Thrombocytopenia, unspecified: Secondary | ICD-10-CM

## 2018-07-11 DIAGNOSIS — D61818 Other pancytopenia: Secondary | ICD-10-CM

## 2018-07-11 DIAGNOSIS — I4891 Unspecified atrial fibrillation: Secondary | ICD-10-CM

## 2018-07-11 DIAGNOSIS — R918 Other nonspecific abnormal finding of lung field: Secondary | ICD-10-CM | POA: Diagnosis not present

## 2018-07-11 DIAGNOSIS — E039 Hypothyroidism, unspecified: Secondary | ICD-10-CM | POA: Diagnosis not present

## 2018-07-11 LAB — CBC WITH DIFFERENTIAL/PLATELET
Abs Immature Granulocytes: 0.01 10*3/uL (ref 0.00–0.07)
BASOS ABS: 0 10*3/uL (ref 0.0–0.1)
BASOS PCT: 1 %
EOS ABS: 0.2 10*3/uL (ref 0.0–0.5)
Eosinophils Relative: 7 %
HEMATOCRIT: 40.9 % (ref 39.0–52.0)
Hemoglobin: 13.2 g/dL (ref 13.0–17.0)
IMMATURE GRANULOCYTES: 0 %
LYMPHS ABS: 1.1 10*3/uL (ref 0.7–4.0)
Lymphocytes Relative: 35 %
MCH: 32 pg (ref 26.0–34.0)
MCHC: 32.3 g/dL (ref 30.0–36.0)
MCV: 99 fL (ref 80.0–100.0)
Monocytes Absolute: 0.5 10*3/uL (ref 0.1–1.0)
Monocytes Relative: 15 %
NEUTROS ABS: 1.3 10*3/uL — AB (ref 1.7–7.7)
Neutrophils Relative %: 42 %
PLATELETS: 57 10*3/uL — AB (ref 150–400)
RBC: 4.13 MIL/uL — ABNORMAL LOW (ref 4.22–5.81)
RDW: 13.5 % (ref 11.5–15.5)
WBC: 3 10*3/uL — AB (ref 4.0–10.5)
nRBC: 0 % (ref 0.0–0.2)

## 2018-07-11 LAB — COMPREHENSIVE METABOLIC PANEL
ALBUMIN: 3.8 g/dL (ref 3.5–5.0)
ALT: 13 U/L (ref 0–44)
AST: 18 U/L (ref 15–41)
Alkaline Phosphatase: 48 U/L (ref 38–126)
Anion gap: 8 (ref 5–15)
BUN: 18 mg/dL (ref 6–20)
CHLORIDE: 104 mmol/L (ref 98–111)
CO2: 24 mmol/L (ref 22–32)
Calcium: 8.8 mg/dL — ABNORMAL LOW (ref 8.9–10.3)
Creatinine, Ser: 1 mg/dL (ref 0.61–1.24)
GFR calc Af Amer: >60 mL/min (ref >60)
GFR calc non Af Amer: >60 mL/min (ref >60)
GLUCOSE: 89 mg/dL (ref 70–99)
POTASSIUM: 4.2 mmol/L (ref 3.5–5.1)
Sodium: 136 mmol/L (ref 135–145)
Total Bilirubin: 1.6 mg/dL — ABNORMAL HIGH (ref 0.3–1.2)
Total Protein: 8.6 g/dL — ABNORMAL HIGH (ref 6.5–8.1)

## 2018-07-11 LAB — LACTATE DEHYDROGENASE: LDH: 157 U/L (ref 98–192)

## 2018-07-11 NOTE — Patient Instructions (Signed)
[facility] Discharge Instructions     Thank you for choosing [facility] at [facility] to provide your oncology and hematology care.  To afford each patient quality time with our provider, please arrive at least 15 minutes before your scheduled appointment time.   If you have a lab appointment with the Cancer Center please come in thru the  Main Entrance and check in at the main information desk  You need to re-schedule your appointment should you arrive 10 or more minutes late.  We strive to give you quality time with our providers, and arriving late affects you and other patients whose appointments are after yours.  Also, if you no show three or more times for appointments you may be dismissed from the clinic at the providers discretion.     Again, thank you for choosing [facility].  Our hope is that these requests will decrease the amount of time that you wait before being seen by our physicians.       _____________________________________________________________  Should you have questions after your visit to [facility], please contact our office at [phone] between the hours of 8:00 a.m. and 4:30 p.m.  Voicemails left after 4:00 p.m. will not be returned until the following business day.  For prescription refill requests, have your pharmacy contact our office and allow 72 hours.    Cancer Center Support Programs:   > Cancer Support Group  2nd Tuesday of the month 1pm-2pm, Journey Room    

## 2018-07-11 NOTE — Assessment & Plan Note (Addendum)
1.  Thrombocytopenia: - He was previously followed by Dr. Whitney Muse for pancytopenia.  She ordered a bone marrow aspiration and biopsy on 11/25/2008 which was negative for any cytogenetic abnormalities.  - Patient had leukopenia and thrombocytopenia in 2009.  It appears he started having anemia in 2010. -Labs today on 07/11/2018 were as follows: Hemoglobin 13.2, platelets 57, WBC 3.0, LDH 157, and potassium 4.2. - He will return 2 months for repeat labs only.  He will follow-up in 4 months with labs and office visit.  2.  Multiple pulmonary nodules: -Patient had CT CAP on 12/15/2017 which showed multiple nodules involving the left lung, the largest measuring 7 mm.  There are no nodules identified on the right lung. - It is recommended he have a noncontrast chest CT at 3 to 6 months.  - CT chest was repeated 05/24/2018.  Showed scattered solid pulmonary nodules in the left lung all stable since the last scan on 12/15/2017.  Results were reviewed with him today. -It is recommended that we will repeat a CT scan in 18 to 24 months from the 05/24/2018 scan. - He was referred to pulmonology but has not followed up like recommended.  3.  Health maintenance: - Last colonoscopy was done on 04/22/2015 it was negative other than diverticulosis. - He will follow-up with GI as recommended.

## 2018-07-11 NOTE — Progress Notes (Signed)
[place], [place] [address]   CLINIC:  Medical Oncology/Hematology  PCP:  [name], MD [place] [address] [phone]   REASON FOR VISIT: Follow-up for thrombocytopenia  CURRENT THERAPY: Observation   INTERVAL HISTORY:  B[place] 58 y.o. male returns for routine follow-up for thrombocytopenia.  He is here today alone.  He is living in assisted nursing facility.  He is doing well with no complaints at this time.  He is still taking his Coumadin for A. fib and following up with his cardiologist who monitors his levels.  He denies any bleeding or melena.  Denies any shortness of breath.  Denies any easy bruising. Denies any nausea, vomiting, or diarrhea. Denies any new pains. Had not noticed any recent bleeding such as epistaxis, hematuria or hematochezia. Denies recent chest pain on exertion, shortness of breath on minimal exertion, pre-syncopal episodes, or palpitations. Denies any numbness or tingling in hands or feet. Denies any recent fevers, infections, or recent hospitalizations. Patient reports appetite at 100% and energy level at 100%.  He is eating well and maintaining his weight at this time.   REVIEW OF SYSTEMS:  Review of Systems  All other systems reviewed and are negative.    PAST MEDICAL/SURGICAL HISTORY:  Past Medical History:  Diagnosis Date  . Atrial fibrillation ([place])   . CVA (cerebral vascular accident) ([place])   . Hypertension   . Hypothyroidism   . Pancytopenia ([place]) 01/11/2012   Stable  . Thrombocytopenia ([place])    Past Surgical History:  Procedure Laterality Date  . COLONOSCOPY N/A 04/22/2015   Procedure: COLONOSCOPY;  Surgeon: Daneil Dolin, MD;  Location: [facility];  Service: Endoscopy;  Laterality: N/A;  1245  . HERNIA REPAIR     as child     SOCIAL HISTORY:  Social History   Socioeconomic History  . Marital status: Single    Spouse name: Not on file  . Number of  children: Not on file  . Years of education: Not on file  . Highest education level: Not on file  Occupational History  . Not on file  Social Needs  . Financial resource strain: Not on file  . Food insecurity:    Worry: Not on file    Inability: Not on file  . Transportation needs:    Medical: Not on file    Non-medical: Not on file  Tobacco Use  . Smoking status: Never Smoker  . Smokeless tobacco: Never Used  Substance and Sexual Activity  . Alcohol use: No    Alcohol/week: 0.0 standard drinks  . Drug use: No  . Sexual activity: Not on file  Lifestyle  . Physical activity:    Days per week: Not on file    Minutes per session: Not on file  . Stress: Not on file  Relationships  . Social connections:    Talks on phone: Not on file    Gets together: Not on file    Attends religious service: Not on file    Active member of club or organization: Not on file    Attends meetings of clubs or organizations: Not on file    Relationship status: Not on file  . Intimate partner violence:    Fear of current or ex partner: Not on file    Emotionally abused: Not on file    Physically abused: Not on file    Forced sexual activity: Not on file  Other  Topics Concern  . Not on file  Social History Narrative  . Not on file    FAMILY HISTORY:  Family History  Problem Relation Age of Onset  . Cancer Mother   . Colon cancer Unknown        unknown but THINKS his mom may have had    CURRENT MEDICATIONS:  Outpatient Encounter Medications as of 07/11/2018  Medication Sig Note  . acetaminophen (TYLENOL) 325 MG tablet Take 650 mg by mouth every 6 (six) hours as needed.   . carvedilol (COREG) 12.5 MG tablet Take 12.5 mg by mouth 2 (two) times daily with a meal.     . furosemide (LASIX) 20 MG tablet    . levothyroxine (SYNTHROID, LEVOTHROID) 125 MCG tablet  01/08/2016: Received from: External Pharmacy  . lisinopril (PRINIVIL,ZESTRIL) 10 MG tablet    . loratadine (CLARITIN) 10 MG tablet  Take 10 mg by mouth daily.   . polyethylene glycol-electrolytes (NULYTELY/GOLYTELY) 420 G solution Take 4,000 mLs by mouth once.   . potassium chloride SA (K-DUR,KLOR-CON) 20 MEQ tablet Take 20 mEq by mouth 2 (two) times daily.     . VENTOLIN HFA 108 (90 Base) MCG/ACT inhaler    . warfarin (COUMADIN) 5 MG tablet Take 5 mg by mouth daily. 5 mg MWF; 7.5 mg Tuesday, Thurs, Sat, Sun   . [DISCONTINUED] lisinopril (PRINIVIL,ZESTRIL) 20 MG tablet     No facility-administered encounter medications on file as of 07/11/2018.     ALLERGIES:  No Known Allergies   PHYSICAL EXAM:  ECOG Performance status: 1  Vitals:   07/11/18 1250  BP: 97/74  Pulse: 81  Temp: (!) 97.4 F (36.3 C)   Filed Weights   07/11/18 1250  Weight: 244 lb (110.7 kg)    Physical Exam Constitutional:      Appearance: Normal appearance. He is normal weight.  Cardiovascular:     Rate and Rhythm: Normal rate. Rhythm irregular.     Heart sounds: Normal heart sounds.  Pulmonary:     Effort: Pulmonary effort is normal.     Breath sounds: Normal breath sounds.  Abdominal:     General: Abdomen is flat. Bowel sounds are normal.     Palpations: Abdomen is soft.  Musculoskeletal: Normal range of motion.  Skin:    General: Skin is warm and dry.  Neurological:     Mental Status: He is alert and oriented to person, place, and time. Mental status is at baseline.  Psychiatric:        Mood and Affect: Mood normal.        Behavior: Behavior normal.        Thought Content: Thought content normal.        Judgment: Judgment normal.      LABORATORY DATA:  I have reviewed the labs as listed.  CBC    Component Value Date/Time   WBC 3.0 (L) 07/11/2018 1133   RBC 4.13 (L) 07/11/2018 1133   HGB 13.2 07/11/2018 1133   HGB 13.1 02/18/2012 1619   HCT 40.9 07/11/2018 1133   HCT 38.7 02/18/2012 1619   PLT 57 (L) 07/11/2018 1133   PLT 64 (L) 02/18/2012 1619   MCV 99.0 07/11/2018 1133   MCV 96.4 02/18/2012 1619   MCH 32.0  07/11/2018 1133   MCHC 32.3 07/11/2018 1133   RDW 13.5 07/11/2018 1133   RDW 15.1 (H) 02/18/2012 1619   LYMPHSABS 1.1 07/11/2018 1133   LYMPHSABS 0.8 (L) 02/18/2012 1619   MONOABS 0.5 07/11/2018  1133   MONOABS 0.4 02/18/2012 1619   EOSABS 0.2 07/11/2018 1133   EOSABS 0.1 02/18/2012 1619   BASOSABS 0.0 07/11/2018 1133   BASOSABS 0.0 02/18/2012 1619   CMP Latest Ref Rng & Units 07/11/2018 05/24/2018 05/24/2018  Glucose 70 - 99 mg/dL 89 81 -  BUN 6 - 20 mg/dL 18 19 -  Creatinine 0.61 - 1.24 mg/dL 1.00 0.78 0.80  Sodium 135 - 145 mmol/L 136 137 -  Potassium 3.5 - 5.1 mmol/L 4.2 4.2 -  Chloride 98 - 111 mmol/L 104 108 -  CO2 22 - 32 mmol/L 24 26 -  Calcium 8.9 - 10.3 mg/dL 8.8(L) 8.2(L) -  Total Protein 6.5 - 8.1 g/dL 8.6(H) 7.3 -  Total Bilirubin 0.3 - 1.2 mg/dL 1.6(H) 1.5(H) -  Alkaline Phos 38 - 126 U/L 48 33(L) -  AST 15 - 41 U/L 18 18 -  ALT 0 - 44 U/L 13 15 -       DIAGNOSTIC IMAGING:  I have independently reviewed the CT chest scan and discussed with the patient.   I personally performed a face-to-face visit, made revisions and my assessment and plan is as follows.    ASSESSMENT & PLAN:   Pancytopenia ([place]) 1.  Thrombocytopenia: - He was previously followed by Dr. Whitney Muse for pancytopenia.  She ordered a bone marrow aspiration and biopsy on 11/25/2008 which was negative for any cytogenetic abnormalities.  - Patient had leukopenia and thrombocytopenia in 2009.  It appears he started having anemia in 2010. -Labs today on 07/11/2018 were as follows: Hemoglobin 13.2, platelets 57, WBC 3.0, LDH 157, and potassium 4.2. - He will return 2 months for repeat labs only.  He will follow-up in 4 months with labs and office visit.  2.  Multiple pulmonary nodules: -Patient had CT CAP on 12/15/2017 which showed multiple nodules involving the left lung, the largest measuring 7 mm.  There are no nodules identified on the right lung. - It is recommended he have a noncontrast chest CT at 3 to  6 months.  - CT chest was repeated 05/24/2018.  Showed scattered solid pulmonary nodules in the left lung all stable since the last scan on 12/15/2017.  Results were reviewed with him today. -It is recommended that we will repeat a CT scan in 18 to 24 months from the 05/24/2018 scan. - He was referred to pulmonology but has not followed up like recommended.  3.  Health maintenance: - Last colonoscopy was done on 04/22/2015 it was negative other than diverticulosis. - He will follow-up with GI as recommended.      Orders placed this encounter:  Orders Placed This Encounter  Procedures  . Lactate dehydrogenase  . CBC with Differential/Platelet  . Comprehensive metabolic panel  . Lactate dehydrogenase  . CBC with Differential/Platelet  . Comprehensive metabolic panel      Francene Finders, FNP-C [place] [phone]

## 2018-07-13 DIAGNOSIS — I4891 Unspecified atrial fibrillation: Secondary | ICD-10-CM | POA: Diagnosis not present

## 2018-07-13 DIAGNOSIS — I10 Essential (primary) hypertension: Secondary | ICD-10-CM | POA: Diagnosis not present

## 2018-07-13 DIAGNOSIS — Z7901 Long term (current) use of anticoagulants: Secondary | ICD-10-CM | POA: Diagnosis not present

## 2018-08-10 DIAGNOSIS — I10 Essential (primary) hypertension: Secondary | ICD-10-CM | POA: Diagnosis not present

## 2018-08-10 DIAGNOSIS — Z7901 Long term (current) use of anticoagulants: Secondary | ICD-10-CM | POA: Diagnosis not present

## 2018-08-10 DIAGNOSIS — I4891 Unspecified atrial fibrillation: Secondary | ICD-10-CM | POA: Diagnosis not present

## 2018-09-06 DIAGNOSIS — I10 Essential (primary) hypertension: Secondary | ICD-10-CM | POA: Diagnosis not present

## 2018-09-06 DIAGNOSIS — Z7901 Long term (current) use of anticoagulants: Secondary | ICD-10-CM | POA: Diagnosis not present

## 2018-09-06 DIAGNOSIS — I4891 Unspecified atrial fibrillation: Secondary | ICD-10-CM | POA: Diagnosis not present

## 2018-09-12 ENCOUNTER — Inpatient Hospital Stay (HOSPITAL_COMMUNITY): Payer: Medicare Other | Attending: Hematology

## 2018-09-15 DIAGNOSIS — D61818 Other pancytopenia: Secondary | ICD-10-CM | POA: Diagnosis not present

## 2018-09-15 DIAGNOSIS — I10 Essential (primary) hypertension: Secondary | ICD-10-CM | POA: Diagnosis not present

## 2018-09-15 DIAGNOSIS — E039 Hypothyroidism, unspecified: Secondary | ICD-10-CM | POA: Diagnosis not present

## 2018-10-04 DIAGNOSIS — I4891 Unspecified atrial fibrillation: Secondary | ICD-10-CM | POA: Diagnosis not present

## 2018-10-04 DIAGNOSIS — I10 Essential (primary) hypertension: Secondary | ICD-10-CM | POA: Diagnosis not present

## 2018-10-04 DIAGNOSIS — Z7901 Long term (current) use of anticoagulants: Secondary | ICD-10-CM | POA: Diagnosis not present

## 2018-11-01 DIAGNOSIS — Z7901 Long term (current) use of anticoagulants: Secondary | ICD-10-CM | POA: Diagnosis not present

## 2018-11-01 DIAGNOSIS — I10 Essential (primary) hypertension: Secondary | ICD-10-CM | POA: Diagnosis not present

## 2018-11-01 DIAGNOSIS — I4891 Unspecified atrial fibrillation: Secondary | ICD-10-CM | POA: Diagnosis not present

## 2018-11-13 ENCOUNTER — Ambulatory Visit (HOSPITAL_COMMUNITY): Payer: Medicare Other | Admitting: Nurse Practitioner

## 2018-11-13 ENCOUNTER — Other Ambulatory Visit (HOSPITAL_COMMUNITY): Payer: Medicare Other

## 2018-11-22 ENCOUNTER — Inpatient Hospital Stay (HOSPITAL_COMMUNITY): Payer: Medicare Other | Attending: Nurse Practitioner

## 2018-11-22 ENCOUNTER — Other Ambulatory Visit: Payer: Self-pay

## 2018-11-22 ENCOUNTER — Inpatient Hospital Stay (HOSPITAL_BASED_OUTPATIENT_CLINIC_OR_DEPARTMENT_OTHER): Payer: Medicare Other | Admitting: Nurse Practitioner

## 2018-11-22 DIAGNOSIS — D61818 Other pancytopenia: Secondary | ICD-10-CM | POA: Diagnosis not present

## 2018-11-22 DIAGNOSIS — Z809 Family history of malignant neoplasm, unspecified: Secondary | ICD-10-CM | POA: Diagnosis not present

## 2018-11-22 DIAGNOSIS — Z8673 Personal history of transient ischemic attack (TIA), and cerebral infarction without residual deficits: Secondary | ICD-10-CM | POA: Insufficient documentation

## 2018-11-22 DIAGNOSIS — D696 Thrombocytopenia, unspecified: Secondary | ICD-10-CM | POA: Diagnosis not present

## 2018-11-22 DIAGNOSIS — R918 Other nonspecific abnormal finding of lung field: Secondary | ICD-10-CM | POA: Diagnosis not present

## 2018-11-22 DIAGNOSIS — Z7901 Long term (current) use of anticoagulants: Secondary | ICD-10-CM | POA: Insufficient documentation

## 2018-11-22 DIAGNOSIS — Z800 Family history of malignant neoplasm of digestive organs: Secondary | ICD-10-CM | POA: Diagnosis not present

## 2018-11-22 DIAGNOSIS — Z79899 Other long term (current) drug therapy: Secondary | ICD-10-CM | POA: Diagnosis not present

## 2018-11-22 LAB — IRON AND TIBC
Iron: 70 ug/dL (ref 45–182)
Saturation Ratios: 32 % (ref 17.9–39.5)
TIBC: 221 ug/dL — ABNORMAL LOW (ref 250–450)
UIBC: 151 ug/dL

## 2018-11-22 LAB — CBC WITH DIFFERENTIAL/PLATELET
Abs Immature Granulocytes: 0 10*3/uL (ref 0.00–0.07)
Basophils Absolute: 0 10*3/uL (ref 0.0–0.1)
Basophils Relative: 1 %
Eosinophils Absolute: 0.1 10*3/uL (ref 0.0–0.5)
Eosinophils Relative: 6 %
HCT: 35.2 % — ABNORMAL LOW (ref 39.0–52.0)
Hemoglobin: 11.4 g/dL — ABNORMAL LOW (ref 13.0–17.0)
Immature Granulocytes: 0 %
Lymphocytes Relative: 31 %
Lymphs Abs: 0.7 10*3/uL (ref 0.7–4.0)
MCH: 32.2 pg (ref 26.0–34.0)
MCHC: 32.4 g/dL (ref 30.0–36.0)
MCV: 99.4 fL (ref 80.0–100.0)
Monocytes Absolute: 0.3 10*3/uL (ref 0.1–1.0)
Monocytes Relative: 14 %
Neutro Abs: 1.1 10*3/uL — ABNORMAL LOW (ref 1.7–7.7)
Neutrophils Relative %: 48 %
Platelets: 73 10*3/uL — ABNORMAL LOW (ref 150–400)
RBC: 3.54 MIL/uL — ABNORMAL LOW (ref 4.22–5.81)
RDW: 15.2 % (ref 11.5–15.5)
WBC: 2.2 10*3/uL — ABNORMAL LOW (ref 4.0–10.5)
nRBC: 0 % (ref 0.0–0.2)

## 2018-11-22 LAB — COMPREHENSIVE METABOLIC PANEL
ALT: 14 U/L (ref 0–44)
AST: 20 U/L (ref 15–41)
Albumin: 3.7 g/dL (ref 3.5–5.0)
Alkaline Phosphatase: 38 U/L (ref 38–126)
Anion gap: 8 (ref 5–15)
BUN: 13 mg/dL (ref 6–20)
CO2: 27 mmol/L (ref 22–32)
Calcium: 8.8 mg/dL — ABNORMAL LOW (ref 8.9–10.3)
Chloride: 102 mmol/L (ref 98–111)
Creatinine, Ser: 0.92 mg/dL (ref 0.61–1.24)
GFR calc Af Amer: >60 mL/min (ref >60)
GFR calc non Af Amer: >60 mL/min (ref >60)
Glucose, Bld: 89 mg/dL (ref 70–99)
Potassium: 4 mmol/L (ref 3.5–5.1)
Sodium: 137 mmol/L (ref 135–145)
Total Bilirubin: 1.8 mg/dL — ABNORMAL HIGH (ref 0.3–1.2)
Total Protein: 8.2 g/dL — ABNORMAL HIGH (ref 6.5–8.1)

## 2018-11-22 LAB — LACTATE DEHYDROGENASE: LDH: 186 U/L (ref 98–192)

## 2018-11-22 LAB — FERRITIN: Ferritin: 264 ng/mL (ref 24–336)

## 2018-11-22 NOTE — Assessment & Plan Note (Addendum)
1.  Thrombocytopenia: - He was previously followed by Dr. Whitney Muse for pancytopenia.  She ordered a bone marrow aspiration and biopsy on 11/25/2008 which was negative for any cytogenetic abnormalities.  - Patient had leukopenia and thrombocytopenia in 2009.  It appears he started having anemia in 2010. -Labs today on 11/22/2018 showed his hemoglobin has dropped to 11.4, platelets 73, WBC 2.2, LDH 186, creatinine 0.92, ferritin 264, percent saturation 32 - He will return 3 months for repeat labs.   2.  Multiple pulmonary nodules: -Patient had CT CAP on 12/15/2017 which showed multiple nodules involving the left lung, the largest measuring 7 mm.  There are no nodules identified on the right lung. - It is recommended he have a noncontrast chest CT at 3 to 6 months.  - CT chest was repeated 05/24/2018.  Showed scattered solid pulmonary nodules in the left lung all stable since the last scan on 12/15/2017.  Results were reviewed with him today. -It is recommended that we will repeat a CT scan in 18 to 24 months from the 05/24/2018 scan. - He was referred to pulmonology but has not followed up like recommended.  3.  Health maintenance: - Last colonoscopy was done on 04/22/2015 it was negative other than diverticulosis. - He will follow-up with GI as recommended.

## 2018-11-22 NOTE — Patient Instructions (Signed)
[facility] Discharge Instructions  Follow up in 3 months with labs    Thank you for choosing [facility] at [facility] to provide your oncology and hematology care.  To afford each patient quality time with our provider, please arrive at least 15 minutes before your scheduled appointment time.   If you have a lab appointment with the Cancer Center please come in thru the  Main Entrance and check in at the main information desk  You need to re-schedule your appointment should you arrive 10 or more minutes late.  We strive to give you quality time with our providers, and arriving late affects you and other patients whose appointments are after yours.  Also, if you no show three or more times for appointments you may be dismissed from the clinic at the providers discretion.     Again, thank you for choosing [facility].  Our hope is that these requests will decrease the amount of time that you wait before being seen by our physicians.       _____________________________________________________________  Should you have questions after your visit to [facility], please contact our office at [phone] between the hours of 8:00 a.m. and 4:30 p.m.  Voicemails left after 4:00 p.m. will not be returned until the following business day.  For prescription refill requests, have your pharmacy contact our office and allow 72 hours.    Cancer Center Support Programs:   > Cancer Support Group  2nd Tuesday of the month 1pm-2pm, Journey Room    

## 2018-11-22 NOTE — Progress Notes (Signed)
[place], [place] [address]   CLINIC:  Medical Oncology/Hematology  PCP:  [name], MD [place] [address] [phone]   REASON FOR VISIT: Follow-up for pancytopenia  CURRENT THERAPY: Observation   INTERVAL HISTORY:  [place] 58 y.o. male returns for routine follow-up for pancytopenia.  He reports he has been feeling well since his last visit he has no complaints at this time.  Denies any bright red bleeding per rectum or melena. Denies any nausea, vomiting, or diarrhea. Denies any new pains. Had not noticed any recent bleeding such as epistaxis, hematuria or hematochezia. Denies recent chest pain on exertion, shortness of breath on minimal exertion, pre-syncopal episodes, or palpitations. Denies any numbness or tingling in hands or feet. Denies any recent fevers, infections, or recent hospitalizations. Patient reports appetite at 75% and energy level at 75%.  He is eating well maintain his weight at this time.    REVIEW OF SYSTEMS:  Review of Systems  All other systems reviewed and are negative.    PAST MEDICAL/SURGICAL HISTORY:  Past Medical History:  Diagnosis Date  . Atrial fibrillation ([place])   . CVA (cerebral vascular accident) ([place])   . Hypertension   . Hypothyroidism   . Pancytopenia ([place]) 01/11/2012   Stable  . Thrombocytopenia ([place])    Past Surgical History:  Procedure Laterality Date  . COLONOSCOPY N/A 04/22/2015   Procedure: COLONOSCOPY;  Surgeon: Daneil Dolin, MD;  Location: [facility];  Service: Endoscopy;  Laterality: N/A;  1245  . HERNIA REPAIR     as child     SOCIAL HISTORY:  Social History   Socioeconomic History  . Marital status: Single    Spouse name: Not on file  . Number of children: Not on file  . Years of education: Not on file  . Highest education level: Not on file  Occupational History  . Not on file  Social Needs  . Financial resource strain: Not on  file  . Food insecurity    Worry: Not on file    Inability: Not on file  . Transportation needs    Medical: Not on file    Non-medical: Not on file  Tobacco Use  . Smoking status: Never Smoker  . Smokeless tobacco: Never Used  Substance and Sexual Activity  . Alcohol use: No    Alcohol/week: 0.0 standard drinks  . Drug use: No  . Sexual activity: Not on file  Lifestyle  . Physical activity    Days per week: Not on file    Minutes per session: Not on file  . Stress: Not on file  Relationships  . Social Herbalist on phone: Not on file    Gets together: Not on file    Attends religious service: Not on file    Active member of club or organization: Not on file    Attends meetings of clubs or organizations: Not on file    Relationship status: Not on file  . Intimate partner violence    Fear of current or ex partner: Not on file    Emotionally abused: Not on file    Physically abused: Not on file    Forced sexual activity: Not on file  Other Topics Concern  . Not on file  Social History Narrative  . Not on file    FAMILY HISTORY:  Family History  Problem Relation Age of Onset  . Cancer Mother   .  Colon cancer Unknown        unknown but THINKS his mom may have had    CURRENT MEDICATIONS:  Outpatient Encounter Medications as of 11/22/2018  Medication Sig Note  . acetaminophen (TYLENOL) 325 MG tablet Take 650 mg by mouth every 6 (six) hours as needed.   . carvedilol (COREG) 12.5 MG tablet Take 12.5 mg by mouth 2 (two) times daily with a meal.     . furosemide (LASIX) 20 MG tablet    . levothyroxine (SYNTHROID, LEVOTHROID) 125 MCG tablet  01/08/2016: Received from: External Pharmacy  . lisinopril (ZESTRIL) 5 MG tablet Take 5 mg by mouth daily.   Mar[place] Kitchen loratadine (CLARITIN) 10 MG tablet Take 10 mg by mouth daily.   . polyethylene glycol-electrolytes (NULYTELY/GOLYTELY) 420 G solution Take 4,000 mLs by mouth once.   . potassium chloride SA (K-DUR,KLOR-CON) 20 MEQ  tablet Take 20 mEq by mouth 2 (two) times daily.     . VENTOLIN HFA 108 (90 Base) MCG/ACT inhaler    . warfarin (COUMADIN) 5 MG tablet Take 5 mg by mouth daily. 5 mg MWF; 7.5 mg Tuesday, Thurs, Sat, Sun   . [DISCONTINUED] lisinopril (PRINIVIL,ZESTRIL) 10 MG tablet     No facility-administered encounter medications on file as of 11/22/2018.     ALLERGIES:  No Known Allergies   PHYSICAL EXAM:  ECOG Performance status: 1  Vitals:   11/22/18 1020  BP: 119/81  Pulse: (!) 50  Resp: 18  Temp: (!) 97.1 F (36.2 C)  SpO2: 99%   Filed Weights   11/22/18 1020  Weight: 253 lb (114.8 kg)    Physical Exam Constitutional:      Appearance: Normal appearance. He is normal weight.  Cardiovascular:     Rate and Rhythm: Normal rate and regular rhythm.     Heart sounds: Normal heart sounds.  Pulmonary:     Effort: Pulmonary effort is normal.     Breath sounds: Normal breath sounds.  Abdominal:     General: Bowel sounds are normal.     Palpations: Abdomen is soft.  Musculoskeletal: Normal range of motion.  Skin:    General: Skin is warm and dry.  Neurological:     Mental Status: He is alert and oriented to person, place, and time. Mental status is at baseline.  Psychiatric:        Mood and Affect: Mood normal.        Behavior: Behavior normal.        Thought Content: Thought content normal.        Judgment: Judgment normal.      LABORATORY DATA:  I have reviewed the labs as listed.  CBC    Component Value Date/Time   WBC 2.2 (L) 11/22/2018 0943   RBC 3.54 (L) 11/22/2018 0943   HGB 11.4 (L) 11/22/2018 0943   HGB 13.1 02/18/2012 1619   HCT 35.2 (L) 11/22/2018 0943   HCT 38.7 02/18/2012 1619   PLT 73 (L) 11/22/2018 0943   PLT 64 (L) 02/18/2012 1619   MCV 99.4 11/22/2018 0943   MCV 96.4 02/18/2012 1619   MCH 32.2 11/22/2018 0943   MCHC 32.4 11/22/2018 0943   RDW 15.2 11/22/2018 0943   RDW 15.1 (H) 02/18/2012 1619   LYMPHSABS 0.7 11/22/2018 0943   LYMPHSABS 0.8 (L)  02/18/2012 1619   MONOABS 0.3 11/22/2018 0943   MONOABS 0.4 02/18/2012 1619   EOSABS 0.1 11/22/2018 0943   EOSABS 0.1 02/18/2012 1619   BASOSABS 0.0 11/22/2018 5361  BASOSABS 0.0 02/18/2012 1619   CMP Latest Ref Rng & Units 11/22/2018 07/11/2018 05/24/2018  Glucose 70 - 99 mg/dL 89 89 81  BUN 6 - 20 mg/dL 13 18 19   Creatinine 0.61 - 1.24 mg/dL 0.92 1.00 0.78  Sodium 135 - 145 mmol/L 137 136 137  Potassium 3.5 - 5.1 mmol/L 4.0 4.2 4.2  Chloride 98 - 111 mmol/L 102 104 108  CO2 22 - 32 mmol/L 27 24 26   Calcium 8.9 - 10.3 mg/dL 8.8(L) 8.8(L) 8.2(L)  Total Protein 6.5 - 8.1 g/dL 8.2(H) 8.6(H) 7.3  Total Bilirubin 0.3 - 1.2 mg/dL 1.8(H) 1.6(H) 1.5(H)  Alkaline Phos 38 - 126 U/L 38 48 33(L)  AST 15 - 41 U/L 20 18 18   ALT 0 - 44 U/L 14 13 15    I personally performed a face-to-face visit.  All questions were answered to patient's stated satisfaction. Encouraged patient to call with any new concerns or questions before his next visit to the cancer center and we can certain see him sooner, if needed.     ASSESSMENT & PLAN:   Pancytopenia ([place]) 1.  Thrombocytopenia: - He was previously followed by Dr. Whitney Muse for pancytopenia.  She ordered a bone marrow aspiration and biopsy on 11/25/2008 which was negative for any cytogenetic abnormalities.  - Patient had leukopenia and thrombocytopenia in 2009.  It appears he started having anemia in 2010. -Labs today on 11/22/2018 showed his hemoglobin has dropped to 11.4, platelets 73, WBC 2.2, LDH 186, creatinine 0.92, ferritin 264, percent saturation 32 - He will return 3 months for repeat labs.   2.  Multiple pulmonary nodules: -Patient had CT CAP on 12/15/2017 which showed multiple nodules involving the left lung, the largest measuring 7 mm.  There are no nodules identified on the right lung. - It is recommended he have a noncontrast chest CT at 3 to 6 months.  - CT chest was repeated 05/24/2018.  Showed scattered solid pulmonary nodules in the left lung  all stable since the last scan on 12/15/2017.  Results were reviewed with him today. -It is recommended that we will repeat a CT scan in 18 to 24 months from the 05/24/2018 scan. - He was referred to pulmonology but has not followed up like recommended.  3.  Health maintenance: - Last colonoscopy was done on 04/22/2015 it was negative other than diverticulosis. - He will follow-up with GI as recommended.      Orders placed this encounter:  Orders Placed This Encounter  Procedures  . Ferritin  . Iron and TIBC  . Lactate dehydrogenase  . CBC with Differential/Platelet  . Comprehensive metabolic panel  . Ferritin  . Iron and TIBC  . Vitamin B12  . VITAMIN D 25 Hydroxy (Vit-D Deficiency, Fractures)  . Folate      Francene Finders, FNP-C [place] [phone]

## 2018-12-05 DIAGNOSIS — Z7901 Long term (current) use of anticoagulants: Secondary | ICD-10-CM | POA: Diagnosis not present

## 2018-12-05 DIAGNOSIS — I10 Essential (primary) hypertension: Secondary | ICD-10-CM | POA: Diagnosis not present

## 2018-12-05 DIAGNOSIS — I4891 Unspecified atrial fibrillation: Secondary | ICD-10-CM | POA: Diagnosis not present

## 2018-12-06 DIAGNOSIS — I10 Essential (primary) hypertension: Secondary | ICD-10-CM | POA: Diagnosis not present

## 2018-12-06 DIAGNOSIS — E039 Hypothyroidism, unspecified: Secondary | ICD-10-CM | POA: Diagnosis not present

## 2018-12-06 DIAGNOSIS — I480 Paroxysmal atrial fibrillation: Secondary | ICD-10-CM | POA: Diagnosis not present

## 2018-12-26 DIAGNOSIS — Z7901 Long term (current) use of anticoagulants: Secondary | ICD-10-CM | POA: Diagnosis not present

## 2018-12-26 DIAGNOSIS — I872 Venous insufficiency (chronic) (peripheral): Secondary | ICD-10-CM | POA: Diagnosis not present

## 2018-12-26 DIAGNOSIS — I10 Essential (primary) hypertension: Secondary | ICD-10-CM | POA: Diagnosis not present

## 2018-12-26 DIAGNOSIS — I4891 Unspecified atrial fibrillation: Secondary | ICD-10-CM | POA: Diagnosis not present

## 2019-01-06 DIAGNOSIS — I6350 Cerebral infarction due to unspecified occlusion or stenosis of unspecified cerebral artery: Secondary | ICD-10-CM | POA: Diagnosis not present

## 2019-01-06 DIAGNOSIS — I10 Essential (primary) hypertension: Secondary | ICD-10-CM | POA: Diagnosis not present

## 2019-01-10 DIAGNOSIS — I872 Venous insufficiency (chronic) (peripheral): Secondary | ICD-10-CM | POA: Diagnosis not present

## 2019-01-10 DIAGNOSIS — I4891 Unspecified atrial fibrillation: Secondary | ICD-10-CM | POA: Diagnosis not present

## 2019-01-10 DIAGNOSIS — Z7901 Long term (current) use of anticoagulants: Secondary | ICD-10-CM | POA: Diagnosis not present

## 2019-01-10 DIAGNOSIS — I10 Essential (primary) hypertension: Secondary | ICD-10-CM | POA: Diagnosis not present

## 2019-01-10 DIAGNOSIS — E039 Hypothyroidism, unspecified: Secondary | ICD-10-CM | POA: Diagnosis not present

## 2019-02-05 DIAGNOSIS — E039 Hypothyroidism, unspecified: Secondary | ICD-10-CM | POA: Diagnosis not present

## 2019-02-05 DIAGNOSIS — I10 Essential (primary) hypertension: Secondary | ICD-10-CM | POA: Diagnosis not present

## 2019-02-22 ENCOUNTER — Inpatient Hospital Stay (HOSPITAL_COMMUNITY): Payer: Medicare Other | Attending: Hematology

## 2019-02-22 ENCOUNTER — Ambulatory Visit (HOSPITAL_COMMUNITY): Payer: Medicare Other | Admitting: Nurse Practitioner

## 2019-05-08 DIAGNOSIS — R601 Generalized edema: Secondary | ICD-10-CM | POA: Diagnosis not present

## 2019-05-08 DIAGNOSIS — E039 Hypothyroidism, unspecified: Secondary | ICD-10-CM | POA: Diagnosis not present

## 2019-05-08 DIAGNOSIS — I10 Essential (primary) hypertension: Secondary | ICD-10-CM | POA: Diagnosis not present

## 2019-05-08 DIAGNOSIS — I480 Paroxysmal atrial fibrillation: Secondary | ICD-10-CM | POA: Diagnosis not present

## 2019-05-08 DIAGNOSIS — Z6841 Body Mass Index (BMI) 40.0 and over, adult: Secondary | ICD-10-CM | POA: Diagnosis not present

## 2019-05-09 DIAGNOSIS — I10 Essential (primary) hypertension: Secondary | ICD-10-CM | POA: Diagnosis not present

## 2019-05-09 DIAGNOSIS — R0602 Shortness of breath: Secondary | ICD-10-CM | POA: Diagnosis not present

## 2019-05-09 DIAGNOSIS — I482 Chronic atrial fibrillation, unspecified: Secondary | ICD-10-CM | POA: Diagnosis not present

## 2019-05-09 DIAGNOSIS — E039 Hypothyroidism, unspecified: Secondary | ICD-10-CM | POA: Diagnosis not present

## 2019-05-09 DIAGNOSIS — I110 Hypertensive heart disease with heart failure: Secondary | ICD-10-CM | POA: Diagnosis not present

## 2019-05-09 DIAGNOSIS — I69951 Hemiplegia and hemiparesis following unspecified cerebrovascular disease affecting right dominant side: Secondary | ICD-10-CM | POA: Diagnosis not present

## 2019-05-09 DIAGNOSIS — J189 Pneumonia, unspecified organism: Secondary | ICD-10-CM | POA: Diagnosis not present

## 2019-05-09 DIAGNOSIS — Z0001 Encounter for general adult medical examination with abnormal findings: Secondary | ICD-10-CM | POA: Diagnosis not present

## 2019-05-09 DIAGNOSIS — I509 Heart failure, unspecified: Secondary | ICD-10-CM | POA: Diagnosis not present

## 2019-05-09 DIAGNOSIS — R609 Edema, unspecified: Secondary | ICD-10-CM | POA: Diagnosis not present

## 2019-05-09 DIAGNOSIS — Z7901 Long term (current) use of anticoagulants: Secondary | ICD-10-CM | POA: Diagnosis not present

## 2019-05-09 DIAGNOSIS — R600 Localized edema: Secondary | ICD-10-CM | POA: Diagnosis not present

## 2019-05-09 DIAGNOSIS — I480 Paroxysmal atrial fibrillation: Secondary | ICD-10-CM | POA: Diagnosis not present

## 2019-05-09 DIAGNOSIS — D696 Thrombocytopenia, unspecified: Secondary | ICD-10-CM | POA: Diagnosis not present

## 2019-05-09 DIAGNOSIS — D649 Anemia, unspecified: Secondary | ICD-10-CM | POA: Diagnosis not present

## 2019-05-09 DIAGNOSIS — I4891 Unspecified atrial fibrillation: Secondary | ICD-10-CM | POA: Diagnosis not present

## 2019-05-09 DIAGNOSIS — E785 Hyperlipidemia, unspecified: Secondary | ICD-10-CM | POA: Diagnosis not present

## 2019-05-09 DIAGNOSIS — I5033 Acute on chronic diastolic (congestive) heart failure: Secondary | ICD-10-CM | POA: Diagnosis not present

## 2019-05-09 DIAGNOSIS — I5021 Acute systolic (congestive) heart failure: Secondary | ICD-10-CM | POA: Diagnosis not present

## 2019-05-09 DIAGNOSIS — Z20822 Contact with and (suspected) exposure to covid-19: Secondary | ICD-10-CM | POA: Diagnosis not present

## 2019-05-09 DIAGNOSIS — I872 Venous insufficiency (chronic) (peripheral): Secondary | ICD-10-CM | POA: Diagnosis not present

## 2019-05-09 DIAGNOSIS — R7401 Elevation of levels of liver transaminase levels: Secondary | ICD-10-CM | POA: Diagnosis not present

## 2019-05-09 DIAGNOSIS — K759 Inflammatory liver disease, unspecified: Secondary | ICD-10-CM | POA: Diagnosis not present

## 2019-05-10 ENCOUNTER — Other Ambulatory Visit (HOSPITAL_COMMUNITY): Payer: Self-pay | Admitting: Internal Medicine

## 2019-05-10 ENCOUNTER — Other Ambulatory Visit: Payer: Self-pay | Admitting: Internal Medicine

## 2019-05-10 DIAGNOSIS — K769 Liver disease, unspecified: Secondary | ICD-10-CM

## 2019-05-14 ENCOUNTER — Encounter (HOSPITAL_COMMUNITY): Payer: Self-pay

## 2019-05-14 ENCOUNTER — Ambulatory Visit (HOSPITAL_COMMUNITY): Payer: Medicare HMO

## 2019-05-15 ENCOUNTER — Other Ambulatory Visit (HOSPITAL_COMMUNITY): Payer: Medicare Other

## 2019-05-18 DIAGNOSIS — I5030 Unspecified diastolic (congestive) heart failure: Secondary | ICD-10-CM | POA: Diagnosis not present

## 2019-05-18 DIAGNOSIS — D61818 Other pancytopenia: Secondary | ICD-10-CM | POA: Diagnosis not present

## 2019-05-18 DIAGNOSIS — R601 Generalized edema: Secondary | ICD-10-CM | POA: Diagnosis not present

## 2019-05-18 DIAGNOSIS — I480 Paroxysmal atrial fibrillation: Secondary | ICD-10-CM | POA: Diagnosis not present

## 2019-05-23 DIAGNOSIS — L89896 Pressure-induced deep tissue damage of other site: Secondary | ICD-10-CM | POA: Diagnosis not present

## 2019-05-23 DIAGNOSIS — L89623 Pressure ulcer of left heel, stage 3: Secondary | ICD-10-CM | POA: Diagnosis not present

## 2019-05-23 DIAGNOSIS — E7800 Pure hypercholesterolemia, unspecified: Secondary | ICD-10-CM | POA: Diagnosis not present

## 2019-05-23 DIAGNOSIS — E039 Hypothyroidism, unspecified: Secondary | ICD-10-CM | POA: Diagnosis not present

## 2019-05-23 DIAGNOSIS — I110 Hypertensive heart disease with heart failure: Secondary | ICD-10-CM | POA: Diagnosis not present

## 2019-05-23 DIAGNOSIS — C9420 Acute megakaryoblastic leukemia not having achieved remission: Secondary | ICD-10-CM | POA: Diagnosis not present

## 2019-05-23 DIAGNOSIS — I5032 Chronic diastolic (congestive) heart failure: Secondary | ICD-10-CM | POA: Diagnosis not present

## 2019-05-23 DIAGNOSIS — D61818 Other pancytopenia: Secondary | ICD-10-CM | POA: Diagnosis not present

## 2019-05-23 DIAGNOSIS — I480 Paroxysmal atrial fibrillation: Secondary | ICD-10-CM | POA: Diagnosis not present

## 2019-05-24 DIAGNOSIS — I480 Paroxysmal atrial fibrillation: Secondary | ICD-10-CM | POA: Diagnosis not present

## 2019-05-24 DIAGNOSIS — D649 Anemia, unspecified: Secondary | ICD-10-CM | POA: Diagnosis not present

## 2019-05-24 DIAGNOSIS — C9420 Acute megakaryoblastic leukemia not having achieved remission: Secondary | ICD-10-CM | POA: Diagnosis not present

## 2019-05-24 DIAGNOSIS — E7800 Pure hypercholesterolemia, unspecified: Secondary | ICD-10-CM | POA: Diagnosis not present

## 2019-05-24 DIAGNOSIS — I110 Hypertensive heart disease with heart failure: Secondary | ICD-10-CM | POA: Diagnosis not present

## 2019-05-24 DIAGNOSIS — I5032 Chronic diastolic (congestive) heart failure: Secondary | ICD-10-CM | POA: Diagnosis not present

## 2019-05-24 DIAGNOSIS — L89896 Pressure-induced deep tissue damage of other site: Secondary | ICD-10-CM | POA: Diagnosis not present

## 2019-05-24 DIAGNOSIS — E039 Hypothyroidism, unspecified: Secondary | ICD-10-CM | POA: Diagnosis not present

## 2019-05-24 DIAGNOSIS — L89623 Pressure ulcer of left heel, stage 3: Secondary | ICD-10-CM | POA: Diagnosis not present

## 2019-05-24 DIAGNOSIS — D61818 Other pancytopenia: Secondary | ICD-10-CM | POA: Diagnosis not present

## 2019-05-25 DIAGNOSIS — D61818 Other pancytopenia: Secondary | ICD-10-CM | POA: Diagnosis not present

## 2019-05-25 DIAGNOSIS — L89896 Pressure-induced deep tissue damage of other site: Secondary | ICD-10-CM | POA: Diagnosis not present

## 2019-05-25 DIAGNOSIS — I110 Hypertensive heart disease with heart failure: Secondary | ICD-10-CM | POA: Diagnosis not present

## 2019-05-25 DIAGNOSIS — C9420 Acute megakaryoblastic leukemia not having achieved remission: Secondary | ICD-10-CM | POA: Diagnosis not present

## 2019-05-25 DIAGNOSIS — E7800 Pure hypercholesterolemia, unspecified: Secondary | ICD-10-CM | POA: Diagnosis not present

## 2019-05-25 DIAGNOSIS — I5032 Chronic diastolic (congestive) heart failure: Secondary | ICD-10-CM | POA: Diagnosis not present

## 2019-05-25 DIAGNOSIS — I480 Paroxysmal atrial fibrillation: Secondary | ICD-10-CM | POA: Diagnosis not present

## 2019-05-25 DIAGNOSIS — L89623 Pressure ulcer of left heel, stage 3: Secondary | ICD-10-CM | POA: Diagnosis not present

## 2019-05-25 DIAGNOSIS — E039 Hypothyroidism, unspecified: Secondary | ICD-10-CM | POA: Diagnosis not present

## 2019-05-29 DIAGNOSIS — E7800 Pure hypercholesterolemia, unspecified: Secondary | ICD-10-CM | POA: Diagnosis not present

## 2019-05-29 DIAGNOSIS — L89623 Pressure ulcer of left heel, stage 3: Secondary | ICD-10-CM | POA: Diagnosis not present

## 2019-05-29 DIAGNOSIS — I110 Hypertensive heart disease with heart failure: Secondary | ICD-10-CM | POA: Diagnosis not present

## 2019-05-29 DIAGNOSIS — E039 Hypothyroidism, unspecified: Secondary | ICD-10-CM | POA: Diagnosis not present

## 2019-05-29 DIAGNOSIS — L89896 Pressure-induced deep tissue damage of other site: Secondary | ICD-10-CM | POA: Diagnosis not present

## 2019-05-29 DIAGNOSIS — I480 Paroxysmal atrial fibrillation: Secondary | ICD-10-CM | POA: Diagnosis not present

## 2019-05-29 DIAGNOSIS — C9420 Acute megakaryoblastic leukemia not having achieved remission: Secondary | ICD-10-CM | POA: Diagnosis not present

## 2019-05-29 DIAGNOSIS — I5032 Chronic diastolic (congestive) heart failure: Secondary | ICD-10-CM | POA: Diagnosis not present

## 2019-05-29 DIAGNOSIS — D61818 Other pancytopenia: Secondary | ICD-10-CM | POA: Diagnosis not present

## 2019-05-30 DIAGNOSIS — I5032 Chronic diastolic (congestive) heart failure: Secondary | ICD-10-CM | POA: Diagnosis not present

## 2019-05-30 DIAGNOSIS — C9420 Acute megakaryoblastic leukemia not having achieved remission: Secondary | ICD-10-CM | POA: Diagnosis not present

## 2019-05-30 DIAGNOSIS — D61818 Other pancytopenia: Secondary | ICD-10-CM | POA: Diagnosis not present

## 2019-05-30 DIAGNOSIS — L89623 Pressure ulcer of left heel, stage 3: Secondary | ICD-10-CM | POA: Diagnosis not present

## 2019-05-30 DIAGNOSIS — I110 Hypertensive heart disease with heart failure: Secondary | ICD-10-CM | POA: Diagnosis not present

## 2019-05-30 DIAGNOSIS — I480 Paroxysmal atrial fibrillation: Secondary | ICD-10-CM | POA: Diagnosis not present

## 2019-05-30 DIAGNOSIS — E7800 Pure hypercholesterolemia, unspecified: Secondary | ICD-10-CM | POA: Diagnosis not present

## 2019-05-30 DIAGNOSIS — L89896 Pressure-induced deep tissue damage of other site: Secondary | ICD-10-CM | POA: Diagnosis not present

## 2019-05-30 DIAGNOSIS — E039 Hypothyroidism, unspecified: Secondary | ICD-10-CM | POA: Diagnosis not present

## 2019-05-31 DIAGNOSIS — I480 Paroxysmal atrial fibrillation: Secondary | ICD-10-CM | POA: Diagnosis not present

## 2019-05-31 DIAGNOSIS — I5032 Chronic diastolic (congestive) heart failure: Secondary | ICD-10-CM | POA: Diagnosis not present

## 2019-05-31 DIAGNOSIS — D61818 Other pancytopenia: Secondary | ICD-10-CM | POA: Diagnosis not present

## 2019-05-31 DIAGNOSIS — C9420 Acute megakaryoblastic leukemia not having achieved remission: Secondary | ICD-10-CM | POA: Diagnosis not present

## 2019-05-31 DIAGNOSIS — L89623 Pressure ulcer of left heel, stage 3: Secondary | ICD-10-CM | POA: Diagnosis not present

## 2019-05-31 DIAGNOSIS — E7800 Pure hypercholesterolemia, unspecified: Secondary | ICD-10-CM | POA: Diagnosis not present

## 2019-05-31 DIAGNOSIS — I110 Hypertensive heart disease with heart failure: Secondary | ICD-10-CM | POA: Diagnosis not present

## 2019-05-31 DIAGNOSIS — L89896 Pressure-induced deep tissue damage of other site: Secondary | ICD-10-CM | POA: Diagnosis not present

## 2019-05-31 DIAGNOSIS — E039 Hypothyroidism, unspecified: Secondary | ICD-10-CM | POA: Diagnosis not present

## 2019-06-02 DIAGNOSIS — E7800 Pure hypercholesterolemia, unspecified: Secondary | ICD-10-CM | POA: Diagnosis not present

## 2019-06-02 DIAGNOSIS — L89623 Pressure ulcer of left heel, stage 3: Secondary | ICD-10-CM | POA: Diagnosis not present

## 2019-06-02 DIAGNOSIS — I5032 Chronic diastolic (congestive) heart failure: Secondary | ICD-10-CM | POA: Diagnosis not present

## 2019-06-02 DIAGNOSIS — E039 Hypothyroidism, unspecified: Secondary | ICD-10-CM | POA: Diagnosis not present

## 2019-06-02 DIAGNOSIS — C9420 Acute megakaryoblastic leukemia not having achieved remission: Secondary | ICD-10-CM | POA: Diagnosis not present

## 2019-06-02 DIAGNOSIS — L89896 Pressure-induced deep tissue damage of other site: Secondary | ICD-10-CM | POA: Diagnosis not present

## 2019-06-02 DIAGNOSIS — I480 Paroxysmal atrial fibrillation: Secondary | ICD-10-CM | POA: Diagnosis not present

## 2019-06-02 DIAGNOSIS — D61818 Other pancytopenia: Secondary | ICD-10-CM | POA: Diagnosis not present

## 2019-06-02 DIAGNOSIS — I110 Hypertensive heart disease with heart failure: Secondary | ICD-10-CM | POA: Diagnosis not present

## 2019-06-04 DIAGNOSIS — I110 Hypertensive heart disease with heart failure: Secondary | ICD-10-CM | POA: Diagnosis not present

## 2019-06-04 DIAGNOSIS — D61818 Other pancytopenia: Secondary | ICD-10-CM | POA: Diagnosis not present

## 2019-06-04 DIAGNOSIS — C9420 Acute megakaryoblastic leukemia not having achieved remission: Secondary | ICD-10-CM | POA: Diagnosis not present

## 2019-06-04 DIAGNOSIS — E7800 Pure hypercholesterolemia, unspecified: Secondary | ICD-10-CM | POA: Diagnosis not present

## 2019-06-04 DIAGNOSIS — I5032 Chronic diastolic (congestive) heart failure: Secondary | ICD-10-CM | POA: Diagnosis not present

## 2019-06-04 DIAGNOSIS — L89896 Pressure-induced deep tissue damage of other site: Secondary | ICD-10-CM | POA: Diagnosis not present

## 2019-06-04 DIAGNOSIS — I480 Paroxysmal atrial fibrillation: Secondary | ICD-10-CM | POA: Diagnosis not present

## 2019-06-04 DIAGNOSIS — E039 Hypothyroidism, unspecified: Secondary | ICD-10-CM | POA: Diagnosis not present

## 2019-06-04 DIAGNOSIS — L89623 Pressure ulcer of left heel, stage 3: Secondary | ICD-10-CM | POA: Diagnosis not present

## 2019-06-06 DIAGNOSIS — L89623 Pressure ulcer of left heel, stage 3: Secondary | ICD-10-CM | POA: Diagnosis not present

## 2019-06-06 DIAGNOSIS — E039 Hypothyroidism, unspecified: Secondary | ICD-10-CM | POA: Diagnosis not present

## 2019-06-06 DIAGNOSIS — Z136 Encounter for screening for cardiovascular disorders: Secondary | ICD-10-CM | POA: Diagnosis not present

## 2019-06-06 DIAGNOSIS — L89896 Pressure-induced deep tissue damage of other site: Secondary | ICD-10-CM | POA: Diagnosis not present

## 2019-06-06 DIAGNOSIS — D696 Thrombocytopenia, unspecified: Secondary | ICD-10-CM | POA: Diagnosis not present

## 2019-06-06 DIAGNOSIS — I110 Hypertensive heart disease with heart failure: Secondary | ICD-10-CM | POA: Diagnosis not present

## 2019-06-06 DIAGNOSIS — I872 Venous insufficiency (chronic) (peripheral): Secondary | ICD-10-CM | POA: Diagnosis not present

## 2019-06-06 DIAGNOSIS — I5032 Chronic diastolic (congestive) heart failure: Secondary | ICD-10-CM | POA: Diagnosis not present

## 2019-06-06 DIAGNOSIS — I4891 Unspecified atrial fibrillation: Secondary | ICD-10-CM | POA: Diagnosis not present

## 2019-06-06 DIAGNOSIS — C9420 Acute megakaryoblastic leukemia not having achieved remission: Secondary | ICD-10-CM | POA: Diagnosis not present

## 2019-06-06 DIAGNOSIS — E7800 Pure hypercholesterolemia, unspecified: Secondary | ICD-10-CM | POA: Diagnosis not present

## 2019-06-06 DIAGNOSIS — I10 Essential (primary) hypertension: Secondary | ICD-10-CM | POA: Diagnosis not present

## 2019-06-06 DIAGNOSIS — I480 Paroxysmal atrial fibrillation: Secondary | ICD-10-CM | POA: Diagnosis not present

## 2019-06-06 DIAGNOSIS — D61818 Other pancytopenia: Secondary | ICD-10-CM | POA: Diagnosis not present

## 2019-06-06 DIAGNOSIS — E785 Hyperlipidemia, unspecified: Secondary | ICD-10-CM | POA: Diagnosis not present

## 2019-06-06 DIAGNOSIS — Z7901 Long term (current) use of anticoagulants: Secondary | ICD-10-CM | POA: Diagnosis not present

## 2019-06-08 DIAGNOSIS — D61818 Other pancytopenia: Secondary | ICD-10-CM | POA: Diagnosis not present

## 2019-06-08 DIAGNOSIS — L89623 Pressure ulcer of left heel, stage 3: Secondary | ICD-10-CM | POA: Diagnosis not present

## 2019-06-08 DIAGNOSIS — I480 Paroxysmal atrial fibrillation: Secondary | ICD-10-CM | POA: Diagnosis not present

## 2019-06-08 DIAGNOSIS — E039 Hypothyroidism, unspecified: Secondary | ICD-10-CM | POA: Diagnosis not present

## 2019-06-08 DIAGNOSIS — C9420 Acute megakaryoblastic leukemia not having achieved remission: Secondary | ICD-10-CM | POA: Diagnosis not present

## 2019-06-08 DIAGNOSIS — L89896 Pressure-induced deep tissue damage of other site: Secondary | ICD-10-CM | POA: Diagnosis not present

## 2019-06-08 DIAGNOSIS — I110 Hypertensive heart disease with heart failure: Secondary | ICD-10-CM | POA: Diagnosis not present

## 2019-06-08 DIAGNOSIS — I5032 Chronic diastolic (congestive) heart failure: Secondary | ICD-10-CM | POA: Diagnosis not present

## 2019-06-08 DIAGNOSIS — E7800 Pure hypercholesterolemia, unspecified: Secondary | ICD-10-CM | POA: Diagnosis not present

## 2019-06-11 DIAGNOSIS — L89623 Pressure ulcer of left heel, stage 3: Secondary | ICD-10-CM | POA: Diagnosis not present

## 2019-06-11 DIAGNOSIS — I480 Paroxysmal atrial fibrillation: Secondary | ICD-10-CM | POA: Diagnosis not present

## 2019-06-11 DIAGNOSIS — E7800 Pure hypercholesterolemia, unspecified: Secondary | ICD-10-CM | POA: Diagnosis not present

## 2019-06-11 DIAGNOSIS — C9420 Acute megakaryoblastic leukemia not having achieved remission: Secondary | ICD-10-CM | POA: Diagnosis not present

## 2019-06-11 DIAGNOSIS — D61818 Other pancytopenia: Secondary | ICD-10-CM | POA: Diagnosis not present

## 2019-06-11 DIAGNOSIS — L89896 Pressure-induced deep tissue damage of other site: Secondary | ICD-10-CM | POA: Diagnosis not present

## 2019-06-11 DIAGNOSIS — E039 Hypothyroidism, unspecified: Secondary | ICD-10-CM | POA: Diagnosis not present

## 2019-06-11 DIAGNOSIS — I110 Hypertensive heart disease with heart failure: Secondary | ICD-10-CM | POA: Diagnosis not present

## 2019-06-11 DIAGNOSIS — I5032 Chronic diastolic (congestive) heart failure: Secondary | ICD-10-CM | POA: Diagnosis not present

## 2019-06-14 DIAGNOSIS — I5032 Chronic diastolic (congestive) heart failure: Secondary | ICD-10-CM | POA: Diagnosis not present

## 2019-06-14 DIAGNOSIS — C9420 Acute megakaryoblastic leukemia not having achieved remission: Secondary | ICD-10-CM | POA: Diagnosis not present

## 2019-06-14 DIAGNOSIS — E7800 Pure hypercholesterolemia, unspecified: Secondary | ICD-10-CM | POA: Diagnosis not present

## 2019-06-14 DIAGNOSIS — L89896 Pressure-induced deep tissue damage of other site: Secondary | ICD-10-CM | POA: Diagnosis not present

## 2019-06-14 DIAGNOSIS — D61818 Other pancytopenia: Secondary | ICD-10-CM | POA: Diagnosis not present

## 2019-06-14 DIAGNOSIS — I480 Paroxysmal atrial fibrillation: Secondary | ICD-10-CM | POA: Diagnosis not present

## 2019-06-14 DIAGNOSIS — I110 Hypertensive heart disease with heart failure: Secondary | ICD-10-CM | POA: Diagnosis not present

## 2019-06-14 DIAGNOSIS — E039 Hypothyroidism, unspecified: Secondary | ICD-10-CM | POA: Diagnosis not present

## 2019-06-14 DIAGNOSIS — L89623 Pressure ulcer of left heel, stage 3: Secondary | ICD-10-CM | POA: Diagnosis not present

## 2019-06-17 DIAGNOSIS — I6350 Cerebral infarction due to unspecified occlusion or stenosis of unspecified cerebral artery: Secondary | ICD-10-CM | POA: Diagnosis not present

## 2019-06-17 DIAGNOSIS — E039 Hypothyroidism, unspecified: Secondary | ICD-10-CM | POA: Diagnosis not present

## 2019-06-18 DIAGNOSIS — E039 Hypothyroidism, unspecified: Secondary | ICD-10-CM | POA: Diagnosis not present

## 2019-06-18 DIAGNOSIS — E7800 Pure hypercholesterolemia, unspecified: Secondary | ICD-10-CM | POA: Diagnosis not present

## 2019-06-18 DIAGNOSIS — D61818 Other pancytopenia: Secondary | ICD-10-CM | POA: Diagnosis not present

## 2019-06-18 DIAGNOSIS — L89623 Pressure ulcer of left heel, stage 3: Secondary | ICD-10-CM | POA: Diagnosis not present

## 2019-06-18 DIAGNOSIS — I480 Paroxysmal atrial fibrillation: Secondary | ICD-10-CM | POA: Diagnosis not present

## 2019-06-18 DIAGNOSIS — I5032 Chronic diastolic (congestive) heart failure: Secondary | ICD-10-CM | POA: Diagnosis not present

## 2019-06-18 DIAGNOSIS — C9420 Acute megakaryoblastic leukemia not having achieved remission: Secondary | ICD-10-CM | POA: Diagnosis not present

## 2019-06-18 DIAGNOSIS — L89896 Pressure-induced deep tissue damage of other site: Secondary | ICD-10-CM | POA: Diagnosis not present

## 2019-06-18 DIAGNOSIS — I110 Hypertensive heart disease with heart failure: Secondary | ICD-10-CM | POA: Diagnosis not present

## 2019-06-20 DIAGNOSIS — R609 Edema, unspecified: Secondary | ICD-10-CM | POA: Diagnosis not present

## 2019-06-20 DIAGNOSIS — I890 Lymphedema, not elsewhere classified: Secondary | ICD-10-CM | POA: Diagnosis not present

## 2019-06-21 DIAGNOSIS — I480 Paroxysmal atrial fibrillation: Secondary | ICD-10-CM | POA: Diagnosis not present

## 2019-06-21 DIAGNOSIS — C9420 Acute megakaryoblastic leukemia not having achieved remission: Secondary | ICD-10-CM | POA: Diagnosis not present

## 2019-06-21 DIAGNOSIS — I5032 Chronic diastolic (congestive) heart failure: Secondary | ICD-10-CM | POA: Diagnosis not present

## 2019-06-21 DIAGNOSIS — E039 Hypothyroidism, unspecified: Secondary | ICD-10-CM | POA: Diagnosis not present

## 2019-06-21 DIAGNOSIS — E7800 Pure hypercholesterolemia, unspecified: Secondary | ICD-10-CM | POA: Diagnosis not present

## 2019-06-21 DIAGNOSIS — D61818 Other pancytopenia: Secondary | ICD-10-CM | POA: Diagnosis not present

## 2019-06-21 DIAGNOSIS — L89623 Pressure ulcer of left heel, stage 3: Secondary | ICD-10-CM | POA: Diagnosis not present

## 2019-06-21 DIAGNOSIS — L89896 Pressure-induced deep tissue damage of other site: Secondary | ICD-10-CM | POA: Diagnosis not present

## 2019-06-21 DIAGNOSIS — I110 Hypertensive heart disease with heart failure: Secondary | ICD-10-CM | POA: Diagnosis not present

## 2019-06-25 ENCOUNTER — Other Ambulatory Visit: Payer: Self-pay

## 2019-06-25 ENCOUNTER — Ambulatory Visit: Payer: Medicare HMO | Attending: Internal Medicine

## 2019-06-25 DIAGNOSIS — B957 Other staphylococcus as the cause of diseases classified elsewhere: Secondary | ICD-10-CM | POA: Diagnosis not present

## 2019-06-25 DIAGNOSIS — Z20822 Contact with and (suspected) exposure to covid-19: Secondary | ICD-10-CM | POA: Diagnosis not present

## 2019-06-25 DIAGNOSIS — I10 Essential (primary) hypertension: Secondary | ICD-10-CM | POA: Diagnosis not present

## 2019-06-25 DIAGNOSIS — A419 Sepsis, unspecified organism: Secondary | ICD-10-CM | POA: Diagnosis not present

## 2019-06-25 DIAGNOSIS — L89620 Pressure ulcer of left heel, unstageable: Secondary | ICD-10-CM | POA: Diagnosis not present

## 2019-06-25 DIAGNOSIS — R571 Hypovolemic shock: Secondary | ICD-10-CM | POA: Diagnosis not present

## 2019-06-25 DIAGNOSIS — N179 Acute kidney failure, unspecified: Secondary | ICD-10-CM | POA: Diagnosis not present

## 2019-06-25 DIAGNOSIS — L97428 Non-pressure chronic ulcer of left heel and midfoot with other specified severity: Secondary | ICD-10-CM | POA: Diagnosis not present

## 2019-06-25 DIAGNOSIS — A4151 Sepsis due to Escherichia coli [E. coli]: Secondary | ICD-10-CM | POA: Diagnosis not present

## 2019-06-25 DIAGNOSIS — R52 Pain, unspecified: Secondary | ICD-10-CM | POA: Diagnosis not present

## 2019-06-25 DIAGNOSIS — I69851 Hemiplegia and hemiparesis following other cerebrovascular disease affecting right dominant side: Secondary | ICD-10-CM | POA: Diagnosis not present

## 2019-06-25 DIAGNOSIS — L03116 Cellulitis of left lower limb: Secondary | ICD-10-CM | POA: Diagnosis not present

## 2019-06-25 DIAGNOSIS — I4821 Permanent atrial fibrillation: Secondary | ICD-10-CM | POA: Diagnosis not present

## 2019-06-25 DIAGNOSIS — L97418 Non-pressure chronic ulcer of right heel and midfoot with other specified severity: Secondary | ICD-10-CM | POA: Diagnosis not present

## 2019-06-25 DIAGNOSIS — I959 Hypotension, unspecified: Secondary | ICD-10-CM | POA: Diagnosis not present

## 2019-06-25 DIAGNOSIS — L97524 Non-pressure chronic ulcer of other part of left foot with necrosis of bone: Secondary | ICD-10-CM | POA: Diagnosis not present

## 2019-06-25 DIAGNOSIS — L03115 Cellulitis of right lower limb: Secondary | ICD-10-CM | POA: Diagnosis not present

## 2019-06-25 DIAGNOSIS — R000 Tachycardia, unspecified: Secondary | ICD-10-CM | POA: Diagnosis not present

## 2019-06-25 DIAGNOSIS — I110 Hypertensive heart disease with heart failure: Secondary | ICD-10-CM | POA: Diagnosis not present

## 2019-06-25 DIAGNOSIS — R4182 Altered mental status, unspecified: Secondary | ICD-10-CM | POA: Diagnosis not present

## 2019-06-25 DIAGNOSIS — M869 Osteomyelitis, unspecified: Secondary | ICD-10-CM | POA: Diagnosis not present

## 2019-06-25 DIAGNOSIS — R7881 Bacteremia: Secondary | ICD-10-CM | POA: Diagnosis not present

## 2019-06-25 DIAGNOSIS — R609 Edema, unspecified: Secondary | ICD-10-CM | POA: Diagnosis not present

## 2019-06-25 DIAGNOSIS — M86172 Other acute osteomyelitis, left ankle and foot: Secondary | ICD-10-CM | POA: Diagnosis not present

## 2019-06-25 DIAGNOSIS — B9629 Other Escherichia coli [E. coli] as the cause of diseases classified elsewhere: Secondary | ICD-10-CM | POA: Diagnosis not present

## 2019-06-25 DIAGNOSIS — R410 Disorientation, unspecified: Secondary | ICD-10-CM | POA: Diagnosis not present

## 2019-06-25 DIAGNOSIS — R0902 Hypoxemia: Secondary | ICD-10-CM | POA: Diagnosis not present

## 2019-06-25 DIAGNOSIS — I69391 Dysphagia following cerebral infarction: Secondary | ICD-10-CM | POA: Diagnosis not present

## 2019-06-25 DIAGNOSIS — R4020 Unspecified coma: Secondary | ICD-10-CM | POA: Diagnosis not present

## 2019-06-25 DIAGNOSIS — I4891 Unspecified atrial fibrillation: Secondary | ICD-10-CM | POA: Diagnosis not present

## 2019-06-25 DIAGNOSIS — R419 Unspecified symptoms and signs involving cognitive functions and awareness: Secondary | ICD-10-CM | POA: Diagnosis not present

## 2019-06-25 DIAGNOSIS — I5032 Chronic diastolic (congestive) heart failure: Secondary | ICD-10-CM | POA: Diagnosis not present

## 2019-06-25 DIAGNOSIS — M2550 Pain in unspecified joint: Secondary | ICD-10-CM | POA: Diagnosis not present

## 2019-06-25 DIAGNOSIS — I69898 Other sequelae of other cerebrovascular disease: Secondary | ICD-10-CM | POA: Diagnosis not present

## 2019-06-25 DIAGNOSIS — R6521 Severe sepsis with septic shock: Secondary | ICD-10-CM | POA: Diagnosis not present

## 2019-06-25 DIAGNOSIS — G9341 Metabolic encephalopathy: Secondary | ICD-10-CM | POA: Diagnosis not present

## 2019-06-25 DIAGNOSIS — M868X7 Other osteomyelitis, ankle and foot: Secondary | ICD-10-CM | POA: Diagnosis not present

## 2019-06-25 DIAGNOSIS — L89610 Pressure ulcer of right heel, unstageable: Secondary | ICD-10-CM | POA: Diagnosis not present

## 2019-06-25 DIAGNOSIS — L97514 Non-pressure chronic ulcer of other part of right foot with necrosis of bone: Secondary | ICD-10-CM | POA: Diagnosis not present

## 2019-06-25 DIAGNOSIS — U071 COVID-19: Secondary | ICD-10-CM | POA: Diagnosis not present

## 2019-06-25 DIAGNOSIS — Z7401 Bed confinement status: Secondary | ICD-10-CM | POA: Diagnosis not present

## 2019-06-26 LAB — NOVEL CORONAVIRUS, NAA: SARS-CoV-2, NAA: DETECTED — AB

## 2019-06-27 ENCOUNTER — Telehealth: Payer: Self-pay | Admitting: Infectious Diseases

## 2019-06-27 DIAGNOSIS — I10 Essential (primary) hypertension: Secondary | ICD-10-CM | POA: Insufficient documentation

## 2019-06-27 NOTE — Telephone Encounter (Signed)
Called to discuss with patient about Covid symptoms and the use of bamlanivimab, a monoclonal antibody infusion for those with mild to moderate Covid symptoms and at a high risk of hospitalization.  Pt is qualified for this infusion at the [facility] infusion center due to Age >55 and Hypertension.    No ability to leave VM on primary line. Mobile # not the patient on VM.

## 2019-06-27 NOTE — Telephone Encounter (Signed)
Reached Caregiver Carin Primrose [phone] in reference to [place] - he tells me that his breathing worsened and required hospitalization. Currently in care at outside hospital with new oxygen requirement.   Does not qualify for monoclonal Ab. Answered questions and wished him well.

## 2019-07-12 DIAGNOSIS — I69851 Hemiplegia and hemiparesis following other cerebrovascular disease affecting right dominant side: Secondary | ICD-10-CM | POA: Diagnosis not present

## 2019-07-12 DIAGNOSIS — E039 Hypothyroidism, unspecified: Secondary | ICD-10-CM | POA: Diagnosis not present

## 2019-07-12 DIAGNOSIS — L03115 Cellulitis of right lower limb: Secondary | ICD-10-CM | POA: Diagnosis not present

## 2019-07-12 DIAGNOSIS — R69 Illness, unspecified: Secondary | ICD-10-CM | POA: Diagnosis not present

## 2019-07-12 DIAGNOSIS — I10 Essential (primary) hypertension: Secondary | ICD-10-CM | POA: Diagnosis not present

## 2019-07-12 DIAGNOSIS — R609 Edema, unspecified: Secondary | ICD-10-CM | POA: Diagnosis not present

## 2019-07-12 DIAGNOSIS — U071 COVID-19: Secondary | ICD-10-CM | POA: Diagnosis not present

## 2019-07-12 DIAGNOSIS — R531 Weakness: Secondary | ICD-10-CM | POA: Diagnosis not present

## 2019-07-12 DIAGNOSIS — R52 Pain, unspecified: Secondary | ICD-10-CM | POA: Diagnosis not present

## 2019-07-12 DIAGNOSIS — L03116 Cellulitis of left lower limb: Secondary | ICD-10-CM | POA: Diagnosis not present

## 2019-07-12 DIAGNOSIS — Z7901 Long term (current) use of anticoagulants: Secondary | ICD-10-CM | POA: Diagnosis not present

## 2019-07-12 DIAGNOSIS — E119 Type 2 diabetes mellitus without complications: Secondary | ICD-10-CM | POA: Diagnosis not present

## 2019-07-12 DIAGNOSIS — Z79899 Other long term (current) drug therapy: Secondary | ICD-10-CM | POA: Diagnosis not present

## 2019-07-12 DIAGNOSIS — I517 Cardiomegaly: Secondary | ICD-10-CM | POA: Diagnosis not present

## 2019-07-12 DIAGNOSIS — I69898 Other sequelae of other cerebrovascular disease: Secondary | ICD-10-CM | POA: Diagnosis not present

## 2019-07-12 DIAGNOSIS — I4819 Other persistent atrial fibrillation: Secondary | ICD-10-CM | POA: Diagnosis not present

## 2019-07-12 DIAGNOSIS — I5030 Unspecified diastolic (congestive) heart failure: Secondary | ICD-10-CM | POA: Diagnosis not present

## 2019-07-12 DIAGNOSIS — I872 Venous insufficiency (chronic) (peripheral): Secondary | ICD-10-CM | POA: Diagnosis not present

## 2019-07-12 DIAGNOSIS — I4891 Unspecified atrial fibrillation: Secondary | ICD-10-CM | POA: Diagnosis not present

## 2019-07-12 DIAGNOSIS — Z136 Encounter for screening for cardiovascular disorders: Secondary | ICD-10-CM | POA: Diagnosis not present

## 2019-07-12 DIAGNOSIS — L89610 Pressure ulcer of right heel, unstageable: Secondary | ICD-10-CM | POA: Diagnosis not present

## 2019-07-12 DIAGNOSIS — Z792 Long term (current) use of antibiotics: Secondary | ICD-10-CM | POA: Diagnosis not present

## 2019-07-12 DIAGNOSIS — L89614 Pressure ulcer of right heel, stage 4: Secondary | ICD-10-CM | POA: Diagnosis not present

## 2019-07-12 DIAGNOSIS — L89624 Pressure ulcer of left heel, stage 4: Secondary | ICD-10-CM | POA: Diagnosis not present

## 2019-07-12 DIAGNOSIS — L89310 Pressure ulcer of right buttock, unstageable: Secondary | ICD-10-CM | POA: Diagnosis not present

## 2019-07-12 DIAGNOSIS — Z7401 Bed confinement status: Secondary | ICD-10-CM | POA: Diagnosis not present

## 2019-07-12 DIAGNOSIS — R5381 Other malaise: Secondary | ICD-10-CM | POA: Diagnosis not present

## 2019-07-12 DIAGNOSIS — I959 Hypotension, unspecified: Secondary | ICD-10-CM | POA: Diagnosis not present

## 2019-07-12 DIAGNOSIS — B957 Other staphylococcus as the cause of diseases classified elsewhere: Secondary | ICD-10-CM | POA: Diagnosis not present

## 2019-07-12 DIAGNOSIS — L89320 Pressure ulcer of left buttock, unstageable: Secondary | ICD-10-CM | POA: Diagnosis not present

## 2019-07-12 DIAGNOSIS — E785 Hyperlipidemia, unspecified: Secondary | ICD-10-CM | POA: Diagnosis not present

## 2019-07-12 DIAGNOSIS — I110 Hypertensive heart disease with heart failure: Secondary | ICD-10-CM | POA: Diagnosis not present

## 2019-07-12 DIAGNOSIS — E7800 Pure hypercholesterolemia, unspecified: Secondary | ICD-10-CM | POA: Diagnosis not present

## 2019-07-12 DIAGNOSIS — A419 Sepsis, unspecified organism: Secondary | ICD-10-CM | POA: Diagnosis not present

## 2019-07-12 DIAGNOSIS — Z452 Encounter for adjustment and management of vascular access device: Secondary | ICD-10-CM | POA: Diagnosis not present

## 2019-07-12 DIAGNOSIS — R895 Abnormal microbiological findings in specimens from other organs, systems and tissues: Secondary | ICD-10-CM | POA: Diagnosis not present

## 2019-07-12 DIAGNOSIS — M86172 Other acute osteomyelitis, left ankle and foot: Secondary | ICD-10-CM | POA: Diagnosis not present

## 2019-07-12 DIAGNOSIS — L89323 Pressure ulcer of left buttock, stage 3: Secondary | ICD-10-CM | POA: Diagnosis not present

## 2019-07-12 DIAGNOSIS — M2550 Pain in unspecified joint: Secondary | ICD-10-CM | POA: Diagnosis not present

## 2019-07-12 DIAGNOSIS — R791 Abnormal coagulation profile: Secondary | ICD-10-CM | POA: Diagnosis not present

## 2019-07-12 DIAGNOSIS — G9341 Metabolic encephalopathy: Secondary | ICD-10-CM | POA: Diagnosis not present

## 2019-07-12 DIAGNOSIS — D649 Anemia, unspecified: Secondary | ICD-10-CM | POA: Diagnosis not present

## 2019-07-12 DIAGNOSIS — R6521 Severe sepsis with septic shock: Secondary | ICD-10-CM | POA: Diagnosis not present

## 2019-07-12 DIAGNOSIS — L89322 Pressure ulcer of left buttock, stage 2: Secondary | ICD-10-CM | POA: Diagnosis not present

## 2019-07-12 DIAGNOSIS — I69391 Dysphagia following cerebral infarction: Secondary | ICD-10-CM | POA: Diagnosis not present

## 2019-07-12 DIAGNOSIS — N390 Urinary tract infection, site not specified: Secondary | ICD-10-CM | POA: Diagnosis not present

## 2019-07-12 DIAGNOSIS — I340 Nonrheumatic mitral (valve) insufficiency: Secondary | ICD-10-CM | POA: Diagnosis not present

## 2019-07-12 DIAGNOSIS — R471 Dysarthria and anarthria: Secondary | ICD-10-CM | POA: Diagnosis not present

## 2019-07-12 DIAGNOSIS — R4182 Altered mental status, unspecified: Secondary | ICD-10-CM | POA: Diagnosis not present

## 2019-07-12 DIAGNOSIS — D696 Thrombocytopenia, unspecified: Secondary | ICD-10-CM | POA: Diagnosis not present

## 2019-07-12 DIAGNOSIS — Z5181 Encounter for therapeutic drug level monitoring: Secondary | ICD-10-CM | POA: Diagnosis not present

## 2019-07-12 DIAGNOSIS — I69351 Hemiplegia and hemiparesis following cerebral infarction affecting right dominant side: Secondary | ICD-10-CM | POA: Diagnosis not present

## 2019-07-12 DIAGNOSIS — R571 Hypovolemic shock: Secondary | ICD-10-CM | POA: Diagnosis not present

## 2019-07-12 DIAGNOSIS — B9629 Other Escherichia coli [E. coli] as the cause of diseases classified elsewhere: Secondary | ICD-10-CM | POA: Diagnosis not present

## 2019-07-12 DIAGNOSIS — I5020 Unspecified systolic (congestive) heart failure: Secondary | ICD-10-CM | POA: Diagnosis not present

## 2019-07-13 DIAGNOSIS — M86172 Other acute osteomyelitis, left ankle and foot: Secondary | ICD-10-CM | POA: Diagnosis not present

## 2019-07-13 DIAGNOSIS — A419 Sepsis, unspecified organism: Secondary | ICD-10-CM | POA: Diagnosis not present

## 2019-07-13 DIAGNOSIS — L89614 Pressure ulcer of right heel, stage 4: Secondary | ICD-10-CM | POA: Diagnosis not present

## 2019-07-13 DIAGNOSIS — R531 Weakness: Secondary | ICD-10-CM | POA: Diagnosis not present

## 2019-07-16 DIAGNOSIS — Z79899 Other long term (current) drug therapy: Secondary | ICD-10-CM | POA: Diagnosis not present

## 2019-07-20 DIAGNOSIS — A419 Sepsis, unspecified organism: Secondary | ICD-10-CM | POA: Diagnosis not present

## 2019-07-20 DIAGNOSIS — L89624 Pressure ulcer of left heel, stage 4: Secondary | ICD-10-CM | POA: Diagnosis not present

## 2019-07-20 DIAGNOSIS — L89614 Pressure ulcer of right heel, stage 4: Secondary | ICD-10-CM | POA: Diagnosis not present

## 2019-07-20 DIAGNOSIS — M86172 Other acute osteomyelitis, left ankle and foot: Secondary | ICD-10-CM | POA: Diagnosis not present

## 2019-07-21 DIAGNOSIS — Z79899 Other long term (current) drug therapy: Secondary | ICD-10-CM | POA: Diagnosis not present

## 2019-07-22 DIAGNOSIS — M86172 Other acute osteomyelitis, left ankle and foot: Secondary | ICD-10-CM | POA: Diagnosis not present

## 2019-07-24 DIAGNOSIS — I5030 Unspecified diastolic (congestive) heart failure: Secondary | ICD-10-CM | POA: Diagnosis not present

## 2019-07-24 DIAGNOSIS — E785 Hyperlipidemia, unspecified: Secondary | ICD-10-CM | POA: Diagnosis not present

## 2019-07-24 DIAGNOSIS — R471 Dysarthria and anarthria: Secondary | ICD-10-CM | POA: Diagnosis not present

## 2019-07-24 DIAGNOSIS — E039 Hypothyroidism, unspecified: Secondary | ICD-10-CM | POA: Diagnosis not present

## 2019-07-27 DIAGNOSIS — R531 Weakness: Secondary | ICD-10-CM | POA: Diagnosis not present

## 2019-07-27 DIAGNOSIS — I4819 Other persistent atrial fibrillation: Secondary | ICD-10-CM | POA: Diagnosis not present

## 2019-07-27 DIAGNOSIS — M86172 Other acute osteomyelitis, left ankle and foot: Secondary | ICD-10-CM | POA: Diagnosis not present

## 2019-07-27 DIAGNOSIS — I5030 Unspecified diastolic (congestive) heart failure: Secondary | ICD-10-CM | POA: Diagnosis not present

## 2019-08-08 DIAGNOSIS — E039 Hypothyroidism, unspecified: Secondary | ICD-10-CM | POA: Diagnosis not present

## 2019-08-08 DIAGNOSIS — Z136 Encounter for screening for cardiovascular disorders: Secondary | ICD-10-CM | POA: Diagnosis not present

## 2019-08-08 DIAGNOSIS — I4891 Unspecified atrial fibrillation: Secondary | ICD-10-CM | POA: Diagnosis not present

## 2019-08-08 DIAGNOSIS — E785 Hyperlipidemia, unspecified: Secondary | ICD-10-CM | POA: Diagnosis not present

## 2019-08-08 DIAGNOSIS — I872 Venous insufficiency (chronic) (peripheral): Secondary | ICD-10-CM | POA: Diagnosis not present

## 2019-08-08 DIAGNOSIS — I340 Nonrheumatic mitral (valve) insufficiency: Secondary | ICD-10-CM | POA: Diagnosis not present

## 2019-08-08 DIAGNOSIS — D696 Thrombocytopenia, unspecified: Secondary | ICD-10-CM | POA: Diagnosis not present

## 2019-08-08 DIAGNOSIS — L89610 Pressure ulcer of right heel, unstageable: Secondary | ICD-10-CM | POA: Diagnosis not present

## 2019-08-08 DIAGNOSIS — I10 Essential (primary) hypertension: Secondary | ICD-10-CM | POA: Diagnosis not present

## 2019-08-08 DIAGNOSIS — L89322 Pressure ulcer of left buttock, stage 2: Secondary | ICD-10-CM | POA: Diagnosis not present

## 2019-08-09 DIAGNOSIS — I4819 Other persistent atrial fibrillation: Secondary | ICD-10-CM | POA: Diagnosis not present

## 2019-08-09 DIAGNOSIS — I5030 Unspecified diastolic (congestive) heart failure: Secondary | ICD-10-CM | POA: Diagnosis not present

## 2019-08-09 DIAGNOSIS — R531 Weakness: Secondary | ICD-10-CM | POA: Diagnosis not present

## 2019-08-09 DIAGNOSIS — R5381 Other malaise: Secondary | ICD-10-CM | POA: Diagnosis not present

## 2019-08-15 DIAGNOSIS — L89320 Pressure ulcer of left buttock, unstageable: Secondary | ICD-10-CM | POA: Diagnosis not present

## 2019-08-15 DIAGNOSIS — L89624 Pressure ulcer of left heel, stage 4: Secondary | ICD-10-CM | POA: Diagnosis not present

## 2019-08-15 DIAGNOSIS — L89614 Pressure ulcer of right heel, stage 4: Secondary | ICD-10-CM | POA: Diagnosis not present

## 2019-08-22 DIAGNOSIS — L89310 Pressure ulcer of right buttock, unstageable: Secondary | ICD-10-CM | POA: Diagnosis not present

## 2019-08-22 DIAGNOSIS — L89614 Pressure ulcer of right heel, stage 4: Secondary | ICD-10-CM | POA: Diagnosis not present

## 2019-08-22 DIAGNOSIS — L89323 Pressure ulcer of left buttock, stage 3: Secondary | ICD-10-CM | POA: Diagnosis not present

## 2019-08-23 DIAGNOSIS — I5030 Unspecified diastolic (congestive) heart failure: Secondary | ICD-10-CM | POA: Diagnosis not present

## 2019-08-23 DIAGNOSIS — R5381 Other malaise: Secondary | ICD-10-CM | POA: Diagnosis not present

## 2019-08-23 DIAGNOSIS — R531 Weakness: Secondary | ICD-10-CM | POA: Diagnosis not present

## 2019-08-23 DIAGNOSIS — I4819 Other persistent atrial fibrillation: Secondary | ICD-10-CM | POA: Diagnosis not present

## 2019-08-29 DIAGNOSIS — L89310 Pressure ulcer of right buttock, unstageable: Secondary | ICD-10-CM | POA: Diagnosis not present

## 2019-08-29 DIAGNOSIS — L89323 Pressure ulcer of left buttock, stage 3: Secondary | ICD-10-CM | POA: Diagnosis not present

## 2019-08-29 DIAGNOSIS — L89614 Pressure ulcer of right heel, stage 4: Secondary | ICD-10-CM | POA: Diagnosis not present

## 2019-09-02 DIAGNOSIS — R531 Weakness: Secondary | ICD-10-CM | POA: Diagnosis not present

## 2019-09-02 DIAGNOSIS — R5381 Other malaise: Secondary | ICD-10-CM | POA: Diagnosis not present

## 2019-09-02 DIAGNOSIS — I10 Essential (primary) hypertension: Secondary | ICD-10-CM | POA: Diagnosis not present

## 2019-09-02 DIAGNOSIS — E039 Hypothyroidism, unspecified: Secondary | ICD-10-CM | POA: Diagnosis not present

## 2019-09-05 DIAGNOSIS — R69 Illness, unspecified: Secondary | ICD-10-CM | POA: Diagnosis not present

## 2019-09-05 DIAGNOSIS — L03115 Cellulitis of right lower limb: Secondary | ICD-10-CM | POA: Diagnosis not present

## 2019-09-05 DIAGNOSIS — R532 Functional quadriplegia: Secondary | ICD-10-CM | POA: Diagnosis not present

## 2019-09-05 DIAGNOSIS — B9629 Other Escherichia coli [E. coli] as the cause of diseases classified elsewhere: Secondary | ICD-10-CM | POA: Diagnosis not present

## 2019-09-05 DIAGNOSIS — I4891 Unspecified atrial fibrillation: Secondary | ICD-10-CM | POA: Diagnosis not present

## 2019-09-05 DIAGNOSIS — B957 Other staphylococcus as the cause of diseases classified elsewhere: Secondary | ICD-10-CM | POA: Diagnosis not present

## 2019-09-05 DIAGNOSIS — N390 Urinary tract infection, site not specified: Secondary | ICD-10-CM | POA: Diagnosis not present

## 2019-09-05 DIAGNOSIS — E039 Hypothyroidism, unspecified: Secondary | ICD-10-CM | POA: Diagnosis not present

## 2019-09-05 DIAGNOSIS — I5020 Unspecified systolic (congestive) heart failure: Secondary | ICD-10-CM | POA: Diagnosis not present

## 2019-09-05 DIAGNOSIS — I69351 Hemiplegia and hemiparesis following cerebral infarction affecting right dominant side: Secondary | ICD-10-CM | POA: Diagnosis not present

## 2019-09-05 DIAGNOSIS — L03116 Cellulitis of left lower limb: Secondary | ICD-10-CM | POA: Diagnosis not present

## 2019-09-05 DIAGNOSIS — I69391 Dysphagia following cerebral infarction: Secondary | ICD-10-CM | POA: Diagnosis not present

## 2019-09-05 DIAGNOSIS — I69851 Hemiplegia and hemiparesis following other cerebrovascular disease affecting right dominant side: Secondary | ICD-10-CM | POA: Diagnosis not present

## 2019-09-05 DIAGNOSIS — R531 Weakness: Secondary | ICD-10-CM | POA: Diagnosis not present

## 2019-09-05 DIAGNOSIS — I482 Chronic atrial fibrillation, unspecified: Secondary | ICD-10-CM | POA: Diagnosis not present

## 2019-09-05 DIAGNOSIS — I959 Hypotension, unspecified: Secondary | ICD-10-CM | POA: Diagnosis not present

## 2019-09-05 DIAGNOSIS — R109 Unspecified abdominal pain: Secondary | ICD-10-CM | POA: Diagnosis not present

## 2019-09-05 DIAGNOSIS — A419 Sepsis, unspecified organism: Secondary | ICD-10-CM | POA: Diagnosis not present

## 2019-09-05 DIAGNOSIS — U071 COVID-19: Secondary | ICD-10-CM | POA: Diagnosis not present

## 2019-09-05 DIAGNOSIS — G9341 Metabolic encephalopathy: Secondary | ICD-10-CM | POA: Diagnosis not present

## 2019-09-05 DIAGNOSIS — I110 Hypertensive heart disease with heart failure: Secondary | ICD-10-CM | POA: Diagnosis not present

## 2019-09-05 DIAGNOSIS — I5032 Chronic diastolic (congestive) heart failure: Secondary | ICD-10-CM | POA: Diagnosis not present

## 2019-09-05 DIAGNOSIS — M86172 Other acute osteomyelitis, left ankle and foot: Secondary | ICD-10-CM | POA: Diagnosis not present

## 2019-09-05 DIAGNOSIS — I69898 Other sequelae of other cerebrovascular disease: Secondary | ICD-10-CM | POA: Diagnosis not present

## 2019-09-05 DIAGNOSIS — R6521 Severe sepsis with septic shock: Secondary | ICD-10-CM | POA: Diagnosis not present

## 2019-09-05 DIAGNOSIS — E785 Hyperlipidemia, unspecified: Secondary | ICD-10-CM | POA: Diagnosis not present

## 2019-09-05 DIAGNOSIS — L89612 Pressure ulcer of right heel, stage 2: Secondary | ICD-10-CM | POA: Diagnosis not present

## 2019-09-05 DIAGNOSIS — E876 Hypokalemia: Secondary | ICD-10-CM | POA: Diagnosis not present

## 2019-09-14 DIAGNOSIS — I69898 Other sequelae of other cerebrovascular disease: Secondary | ICD-10-CM | POA: Diagnosis not present

## 2019-09-14 DIAGNOSIS — L89313 Pressure ulcer of right buttock, stage 3: Secondary | ICD-10-CM | POA: Diagnosis not present

## 2019-09-14 DIAGNOSIS — Z79899 Other long term (current) drug therapy: Secondary | ICD-10-CM | POA: Diagnosis not present

## 2019-09-14 DIAGNOSIS — I4891 Unspecified atrial fibrillation: Secondary | ICD-10-CM | POA: Diagnosis not present

## 2019-09-14 DIAGNOSIS — L89610 Pressure ulcer of right heel, unstageable: Secondary | ICD-10-CM | POA: Diagnosis not present

## 2019-09-14 DIAGNOSIS — L89323 Pressure ulcer of left buttock, stage 3: Secondary | ICD-10-CM | POA: Diagnosis not present

## 2019-09-14 DIAGNOSIS — I959 Hypotension, unspecified: Secondary | ICD-10-CM | POA: Diagnosis not present

## 2019-09-14 DIAGNOSIS — Z872 Personal history of diseases of the skin and subcutaneous tissue: Secondary | ICD-10-CM | POA: Diagnosis not present

## 2019-09-14 DIAGNOSIS — D649 Anemia, unspecified: Secondary | ICD-10-CM | POA: Diagnosis not present

## 2019-09-14 DIAGNOSIS — L03116 Cellulitis of left lower limb: Secondary | ICD-10-CM | POA: Diagnosis not present

## 2019-09-14 DIAGNOSIS — L89629 Pressure ulcer of left heel, unspecified stage: Secondary | ICD-10-CM | POA: Diagnosis not present

## 2019-09-14 DIAGNOSIS — B9629 Other Escherichia coli [E. coli] as the cause of diseases classified elsewhere: Secondary | ICD-10-CM | POA: Diagnosis not present

## 2019-09-14 DIAGNOSIS — I5030 Unspecified diastolic (congestive) heart failure: Secondary | ICD-10-CM | POA: Diagnosis not present

## 2019-09-14 DIAGNOSIS — A419 Sepsis, unspecified organism: Secondary | ICD-10-CM | POA: Diagnosis not present

## 2019-09-14 DIAGNOSIS — R5381 Other malaise: Secondary | ICD-10-CM | POA: Diagnosis not present

## 2019-09-14 DIAGNOSIS — I69851 Hemiplegia and hemiparesis following other cerebrovascular disease affecting right dominant side: Secondary | ICD-10-CM | POA: Diagnosis not present

## 2019-09-14 DIAGNOSIS — N390 Urinary tract infection, site not specified: Secondary | ICD-10-CM | POA: Diagnosis not present

## 2019-09-14 DIAGNOSIS — I10 Essential (primary) hypertension: Secondary | ICD-10-CM | POA: Diagnosis not present

## 2019-09-14 DIAGNOSIS — G9341 Metabolic encephalopathy: Secondary | ICD-10-CM | POA: Diagnosis not present

## 2019-09-14 DIAGNOSIS — L89619 Pressure ulcer of right heel, unspecified stage: Secondary | ICD-10-CM | POA: Diagnosis not present

## 2019-09-14 DIAGNOSIS — E785 Hyperlipidemia, unspecified: Secondary | ICD-10-CM | POA: Diagnosis not present

## 2019-09-14 DIAGNOSIS — R790 Abnormal level of blood mineral: Secondary | ICD-10-CM | POA: Diagnosis not present

## 2019-09-14 DIAGNOSIS — M86172 Other acute osteomyelitis, left ankle and foot: Secondary | ICD-10-CM | POA: Diagnosis not present

## 2019-09-14 DIAGNOSIS — E039 Hypothyroidism, unspecified: Secondary | ICD-10-CM | POA: Diagnosis not present

## 2019-09-14 DIAGNOSIS — L89614 Pressure ulcer of right heel, stage 4: Secondary | ICD-10-CM | POA: Diagnosis not present

## 2019-09-14 DIAGNOSIS — B957 Other staphylococcus as the cause of diseases classified elsewhere: Secondary | ICD-10-CM | POA: Diagnosis not present

## 2019-09-14 DIAGNOSIS — L89624 Pressure ulcer of left heel, stage 4: Secondary | ICD-10-CM | POA: Diagnosis not present

## 2019-09-14 DIAGNOSIS — R69 Illness, unspecified: Secondary | ICD-10-CM | POA: Diagnosis not present

## 2019-09-14 DIAGNOSIS — I69391 Dysphagia following cerebral infarction: Secondary | ICD-10-CM | POA: Diagnosis not present

## 2019-09-14 DIAGNOSIS — U071 COVID-19: Secondary | ICD-10-CM | POA: Diagnosis not present

## 2019-09-14 DIAGNOSIS — R791 Abnormal coagulation profile: Secondary | ICD-10-CM | POA: Diagnosis not present

## 2019-09-14 DIAGNOSIS — L03115 Cellulitis of right lower limb: Secondary | ICD-10-CM | POA: Diagnosis not present

## 2019-09-14 DIAGNOSIS — I4819 Other persistent atrial fibrillation: Secondary | ICD-10-CM | POA: Diagnosis not present

## 2019-09-14 DIAGNOSIS — L97119 Non-pressure chronic ulcer of right thigh with unspecified severity: Secondary | ICD-10-CM | POA: Diagnosis not present

## 2019-09-14 DIAGNOSIS — R609 Edema, unspecified: Secondary | ICD-10-CM | POA: Diagnosis not present

## 2019-09-18 DIAGNOSIS — L89624 Pressure ulcer of left heel, stage 4: Secondary | ICD-10-CM | POA: Diagnosis not present

## 2019-09-18 DIAGNOSIS — M86172 Other acute osteomyelitis, left ankle and foot: Secondary | ICD-10-CM | POA: Diagnosis not present

## 2019-09-18 DIAGNOSIS — I4819 Other persistent atrial fibrillation: Secondary | ICD-10-CM | POA: Diagnosis not present

## 2019-09-18 DIAGNOSIS — R5381 Other malaise: Secondary | ICD-10-CM | POA: Diagnosis not present

## 2019-09-19 DIAGNOSIS — L89323 Pressure ulcer of left buttock, stage 3: Secondary | ICD-10-CM | POA: Diagnosis not present

## 2019-09-19 DIAGNOSIS — L89624 Pressure ulcer of left heel, stage 4: Secondary | ICD-10-CM | POA: Diagnosis not present

## 2019-09-19 DIAGNOSIS — L89614 Pressure ulcer of right heel, stage 4: Secondary | ICD-10-CM | POA: Diagnosis not present

## 2019-09-25 DIAGNOSIS — M86172 Other acute osteomyelitis, left ankle and foot: Secondary | ICD-10-CM | POA: Diagnosis not present

## 2019-09-25 DIAGNOSIS — I4819 Other persistent atrial fibrillation: Secondary | ICD-10-CM | POA: Diagnosis not present

## 2019-09-25 DIAGNOSIS — R5381 Other malaise: Secondary | ICD-10-CM | POA: Diagnosis not present

## 2019-09-25 DIAGNOSIS — L89624 Pressure ulcer of left heel, stage 4: Secondary | ICD-10-CM | POA: Diagnosis not present

## 2019-09-26 DIAGNOSIS — Z872 Personal history of diseases of the skin and subcutaneous tissue: Secondary | ICD-10-CM | POA: Diagnosis not present

## 2019-09-26 DIAGNOSIS — L89323 Pressure ulcer of left buttock, stage 3: Secondary | ICD-10-CM | POA: Diagnosis not present

## 2019-09-26 DIAGNOSIS — L89313 Pressure ulcer of right buttock, stage 3: Secondary | ICD-10-CM | POA: Diagnosis not present

## 2019-09-26 DIAGNOSIS — L89619 Pressure ulcer of right heel, unspecified stage: Secondary | ICD-10-CM | POA: Diagnosis not present

## 2019-09-26 DIAGNOSIS — L89629 Pressure ulcer of left heel, unspecified stage: Secondary | ICD-10-CM | POA: Diagnosis not present

## 2019-09-26 DIAGNOSIS — L89614 Pressure ulcer of right heel, stage 4: Secondary | ICD-10-CM | POA: Diagnosis not present

## 2019-09-27 DIAGNOSIS — I10 Essential (primary) hypertension: Secondary | ICD-10-CM | POA: Diagnosis not present

## 2019-09-27 DIAGNOSIS — E039 Hypothyroidism, unspecified: Secondary | ICD-10-CM | POA: Diagnosis not present

## 2019-09-27 DIAGNOSIS — R5381 Other malaise: Secondary | ICD-10-CM | POA: Diagnosis not present

## 2019-09-27 DIAGNOSIS — E785 Hyperlipidemia, unspecified: Secondary | ICD-10-CM | POA: Diagnosis not present

## 2019-10-03 DIAGNOSIS — L89313 Pressure ulcer of right buttock, stage 3: Secondary | ICD-10-CM | POA: Diagnosis not present

## 2019-10-03 DIAGNOSIS — L89610 Pressure ulcer of right heel, unstageable: Secondary | ICD-10-CM | POA: Diagnosis not present

## 2019-10-03 DIAGNOSIS — L89614 Pressure ulcer of right heel, stage 4: Secondary | ICD-10-CM | POA: Diagnosis not present

## 2019-10-03 DIAGNOSIS — Z872 Personal history of diseases of the skin and subcutaneous tissue: Secondary | ICD-10-CM | POA: Diagnosis not present

## 2019-10-10 DIAGNOSIS — L89624 Pressure ulcer of left heel, stage 4: Secondary | ICD-10-CM | POA: Diagnosis not present

## 2019-10-10 DIAGNOSIS — L89610 Pressure ulcer of right heel, unstageable: Secondary | ICD-10-CM | POA: Diagnosis not present

## 2019-10-10 DIAGNOSIS — L89614 Pressure ulcer of right heel, stage 4: Secondary | ICD-10-CM | POA: Diagnosis not present

## 2019-10-10 DIAGNOSIS — Z872 Personal history of diseases of the skin and subcutaneous tissue: Secondary | ICD-10-CM | POA: Diagnosis not present

## 2019-10-17 DIAGNOSIS — L89624 Pressure ulcer of left heel, stage 4: Secondary | ICD-10-CM | POA: Diagnosis not present

## 2019-10-17 DIAGNOSIS — L89610 Pressure ulcer of right heel, unstageable: Secondary | ICD-10-CM | POA: Diagnosis not present

## 2019-10-17 DIAGNOSIS — L89614 Pressure ulcer of right heel, stage 4: Secondary | ICD-10-CM | POA: Diagnosis not present

## 2019-10-17 DIAGNOSIS — Z872 Personal history of diseases of the skin and subcutaneous tissue: Secondary | ICD-10-CM | POA: Diagnosis not present

## 2019-10-24 DIAGNOSIS — Z872 Personal history of diseases of the skin and subcutaneous tissue: Secondary | ICD-10-CM | POA: Diagnosis not present

## 2019-10-24 DIAGNOSIS — L89624 Pressure ulcer of left heel, stage 4: Secondary | ICD-10-CM | POA: Diagnosis not present

## 2019-10-24 DIAGNOSIS — L97119 Non-pressure chronic ulcer of right thigh with unspecified severity: Secondary | ICD-10-CM | POA: Diagnosis not present

## 2019-10-24 DIAGNOSIS — L89614 Pressure ulcer of right heel, stage 4: Secondary | ICD-10-CM | POA: Diagnosis not present

## 2019-10-25 DIAGNOSIS — E785 Hyperlipidemia, unspecified: Secondary | ICD-10-CM | POA: Diagnosis not present

## 2019-10-25 DIAGNOSIS — I5030 Unspecified diastolic (congestive) heart failure: Secondary | ICD-10-CM | POA: Diagnosis not present

## 2019-10-25 DIAGNOSIS — E039 Hypothyroidism, unspecified: Secondary | ICD-10-CM | POA: Diagnosis not present

## 2019-10-25 DIAGNOSIS — I4819 Other persistent atrial fibrillation: Secondary | ICD-10-CM | POA: Diagnosis not present

## 2019-10-31 DIAGNOSIS — Z872 Personal history of diseases of the skin and subcutaneous tissue: Secondary | ICD-10-CM | POA: Diagnosis not present

## 2019-10-31 DIAGNOSIS — L89614 Pressure ulcer of right heel, stage 4: Secondary | ICD-10-CM | POA: Diagnosis not present

## 2019-10-31 DIAGNOSIS — L89624 Pressure ulcer of left heel, stage 4: Secondary | ICD-10-CM | POA: Diagnosis not present

## 2019-10-31 DIAGNOSIS — L97119 Non-pressure chronic ulcer of right thigh with unspecified severity: Secondary | ICD-10-CM | POA: Diagnosis not present

## 2019-11-01 DIAGNOSIS — R5381 Other malaise: Secondary | ICD-10-CM | POA: Diagnosis not present

## 2019-11-01 DIAGNOSIS — E785 Hyperlipidemia, unspecified: Secondary | ICD-10-CM | POA: Diagnosis not present

## 2019-11-01 DIAGNOSIS — I10 Essential (primary) hypertension: Secondary | ICD-10-CM | POA: Diagnosis not present

## 2019-11-01 DIAGNOSIS — E039 Hypothyroidism, unspecified: Secondary | ICD-10-CM | POA: Diagnosis not present

## 2019-11-02 DIAGNOSIS — D649 Anemia, unspecified: Secondary | ICD-10-CM | POA: Diagnosis not present

## 2019-11-02 DIAGNOSIS — R770 Abnormality of albumin: Secondary | ICD-10-CM | POA: Diagnosis not present

## 2019-11-02 DIAGNOSIS — R790 Abnormal level of blood mineral: Secondary | ICD-10-CM | POA: Diagnosis not present

## 2019-11-02 DIAGNOSIS — I4891 Unspecified atrial fibrillation: Secondary | ICD-10-CM | POA: Diagnosis not present

## 2019-11-09 DIAGNOSIS — Z7901 Long term (current) use of anticoagulants: Secondary | ICD-10-CM | POA: Diagnosis not present

## 2019-11-14 DIAGNOSIS — L89614 Pressure ulcer of right heel, stage 4: Secondary | ICD-10-CM | POA: Diagnosis not present

## 2019-11-14 DIAGNOSIS — Z872 Personal history of diseases of the skin and subcutaneous tissue: Secondary | ICD-10-CM | POA: Diagnosis not present

## 2019-11-14 DIAGNOSIS — L89624 Pressure ulcer of left heel, stage 4: Secondary | ICD-10-CM | POA: Diagnosis not present

## 2019-11-16 DIAGNOSIS — R790 Abnormal level of blood mineral: Secondary | ICD-10-CM | POA: Diagnosis not present

## 2019-11-16 DIAGNOSIS — Z7901 Long term (current) use of anticoagulants: Secondary | ICD-10-CM | POA: Diagnosis not present

## 2019-11-17 DIAGNOSIS — L89614 Pressure ulcer of right heel, stage 4: Secondary | ICD-10-CM | POA: Diagnosis not present

## 2019-11-17 DIAGNOSIS — L89624 Pressure ulcer of left heel, stage 4: Secondary | ICD-10-CM | POA: Diagnosis not present

## 2019-11-20 DIAGNOSIS — R609 Edema, unspecified: Secondary | ICD-10-CM | POA: Diagnosis not present

## 2019-11-21 DIAGNOSIS — L89624 Pressure ulcer of left heel, stage 4: Secondary | ICD-10-CM | POA: Diagnosis not present

## 2019-11-21 DIAGNOSIS — L89614 Pressure ulcer of right heel, stage 4: Secondary | ICD-10-CM | POA: Diagnosis not present

## 2019-11-21 DIAGNOSIS — Z872 Personal history of diseases of the skin and subcutaneous tissue: Secondary | ICD-10-CM | POA: Diagnosis not present

## 2019-11-22 DIAGNOSIS — R5381 Other malaise: Secondary | ICD-10-CM | POA: Diagnosis not present

## 2019-11-22 DIAGNOSIS — G9341 Metabolic encephalopathy: Secondary | ICD-10-CM | POA: Diagnosis not present

## 2019-11-22 DIAGNOSIS — R293 Abnormal posture: Secondary | ICD-10-CM | POA: Diagnosis not present

## 2019-11-22 DIAGNOSIS — I69898 Other sequelae of other cerebrovascular disease: Secondary | ICD-10-CM | POA: Diagnosis not present

## 2019-11-22 DIAGNOSIS — I69851 Hemiplegia and hemiparesis following other cerebrovascular disease affecting right dominant side: Secondary | ICD-10-CM | POA: Diagnosis not present

## 2019-11-22 DIAGNOSIS — L89619 Pressure ulcer of right heel, unspecified stage: Secondary | ICD-10-CM | POA: Diagnosis not present

## 2019-11-22 DIAGNOSIS — L89629 Pressure ulcer of left heel, unspecified stage: Secondary | ICD-10-CM | POA: Diagnosis not present

## 2019-11-22 DIAGNOSIS — I5030 Unspecified diastolic (congestive) heart failure: Secondary | ICD-10-CM | POA: Diagnosis not present

## 2019-11-22 DIAGNOSIS — M6281 Muscle weakness (generalized): Secondary | ICD-10-CM | POA: Diagnosis not present

## 2019-11-23 DIAGNOSIS — I69898 Other sequelae of other cerebrovascular disease: Secondary | ICD-10-CM | POA: Diagnosis not present

## 2019-11-23 DIAGNOSIS — L89624 Pressure ulcer of left heel, stage 4: Secondary | ICD-10-CM | POA: Diagnosis not present

## 2019-11-23 DIAGNOSIS — R293 Abnormal posture: Secondary | ICD-10-CM | POA: Diagnosis not present

## 2019-11-23 DIAGNOSIS — I69851 Hemiplegia and hemiparesis following other cerebrovascular disease affecting right dominant side: Secondary | ICD-10-CM | POA: Diagnosis not present

## 2019-11-23 DIAGNOSIS — I5030 Unspecified diastolic (congestive) heart failure: Secondary | ICD-10-CM | POA: Diagnosis not present

## 2019-11-23 DIAGNOSIS — L89614 Pressure ulcer of right heel, stage 4: Secondary | ICD-10-CM | POA: Diagnosis not present

## 2019-11-23 DIAGNOSIS — I4819 Other persistent atrial fibrillation: Secondary | ICD-10-CM | POA: Diagnosis not present

## 2019-11-23 DIAGNOSIS — Z7901 Long term (current) use of anticoagulants: Secondary | ICD-10-CM | POA: Diagnosis not present

## 2019-11-23 DIAGNOSIS — G9341 Metabolic encephalopathy: Secondary | ICD-10-CM | POA: Diagnosis not present

## 2019-11-23 DIAGNOSIS — E7212 Methylenetetrahydrofolate reductase deficiency: Secondary | ICD-10-CM | POA: Diagnosis not present

## 2019-11-23 DIAGNOSIS — M6281 Muscle weakness (generalized): Secondary | ICD-10-CM | POA: Diagnosis not present

## 2019-11-23 DIAGNOSIS — R5381 Other malaise: Secondary | ICD-10-CM | POA: Diagnosis not present

## 2019-11-26 DIAGNOSIS — I5030 Unspecified diastolic (congestive) heart failure: Secondary | ICD-10-CM | POA: Diagnosis not present

## 2019-11-26 DIAGNOSIS — I69851 Hemiplegia and hemiparesis following other cerebrovascular disease affecting right dominant side: Secondary | ICD-10-CM | POA: Diagnosis not present

## 2019-11-26 DIAGNOSIS — R5381 Other malaise: Secondary | ICD-10-CM | POA: Diagnosis not present

## 2019-11-26 DIAGNOSIS — M6281 Muscle weakness (generalized): Secondary | ICD-10-CM | POA: Diagnosis not present

## 2019-11-26 DIAGNOSIS — R293 Abnormal posture: Secondary | ICD-10-CM | POA: Diagnosis not present

## 2019-11-26 DIAGNOSIS — I69898 Other sequelae of other cerebrovascular disease: Secondary | ICD-10-CM | POA: Diagnosis not present

## 2019-11-26 DIAGNOSIS — G9341 Metabolic encephalopathy: Secondary | ICD-10-CM | POA: Diagnosis not present

## 2019-11-27 DIAGNOSIS — I5030 Unspecified diastolic (congestive) heart failure: Secondary | ICD-10-CM | POA: Diagnosis not present

## 2019-11-27 DIAGNOSIS — G9341 Metabolic encephalopathy: Secondary | ICD-10-CM | POA: Diagnosis not present

## 2019-11-27 DIAGNOSIS — R5381 Other malaise: Secondary | ICD-10-CM | POA: Diagnosis not present

## 2019-11-27 DIAGNOSIS — I69851 Hemiplegia and hemiparesis following other cerebrovascular disease affecting right dominant side: Secondary | ICD-10-CM | POA: Diagnosis not present

## 2019-11-27 DIAGNOSIS — I69898 Other sequelae of other cerebrovascular disease: Secondary | ICD-10-CM | POA: Diagnosis not present

## 2019-11-27 DIAGNOSIS — R293 Abnormal posture: Secondary | ICD-10-CM | POA: Diagnosis not present

## 2019-11-27 DIAGNOSIS — M6281 Muscle weakness (generalized): Secondary | ICD-10-CM | POA: Diagnosis not present

## 2019-11-28 DIAGNOSIS — G9341 Metabolic encephalopathy: Secondary | ICD-10-CM | POA: Diagnosis not present

## 2019-11-28 DIAGNOSIS — L89624 Pressure ulcer of left heel, stage 4: Secondary | ICD-10-CM | POA: Diagnosis not present

## 2019-11-28 DIAGNOSIS — L89614 Pressure ulcer of right heel, stage 4: Secondary | ICD-10-CM | POA: Diagnosis not present

## 2019-11-28 DIAGNOSIS — R293 Abnormal posture: Secondary | ICD-10-CM | POA: Diagnosis not present

## 2019-11-28 DIAGNOSIS — Z872 Personal history of diseases of the skin and subcutaneous tissue: Secondary | ICD-10-CM | POA: Diagnosis not present

## 2019-11-28 DIAGNOSIS — I69898 Other sequelae of other cerebrovascular disease: Secondary | ICD-10-CM | POA: Diagnosis not present

## 2019-11-28 DIAGNOSIS — I5030 Unspecified diastolic (congestive) heart failure: Secondary | ICD-10-CM | POA: Diagnosis not present

## 2019-11-28 DIAGNOSIS — R5381 Other malaise: Secondary | ICD-10-CM | POA: Diagnosis not present

## 2019-11-28 DIAGNOSIS — I69851 Hemiplegia and hemiparesis following other cerebrovascular disease affecting right dominant side: Secondary | ICD-10-CM | POA: Diagnosis not present

## 2019-11-28 DIAGNOSIS — M6281 Muscle weakness (generalized): Secondary | ICD-10-CM | POA: Diagnosis not present

## 2019-11-29 DIAGNOSIS — R293 Abnormal posture: Secondary | ICD-10-CM | POA: Diagnosis not present

## 2019-11-29 DIAGNOSIS — I69851 Hemiplegia and hemiparesis following other cerebrovascular disease affecting right dominant side: Secondary | ICD-10-CM | POA: Diagnosis not present

## 2019-11-29 DIAGNOSIS — I69898 Other sequelae of other cerebrovascular disease: Secondary | ICD-10-CM | POA: Diagnosis not present

## 2019-11-29 DIAGNOSIS — M6281 Muscle weakness (generalized): Secondary | ICD-10-CM | POA: Diagnosis not present

## 2019-11-29 DIAGNOSIS — I5030 Unspecified diastolic (congestive) heart failure: Secondary | ICD-10-CM | POA: Diagnosis not present

## 2019-11-29 DIAGNOSIS — G9341 Metabolic encephalopathy: Secondary | ICD-10-CM | POA: Diagnosis not present

## 2019-11-29 DIAGNOSIS — R5381 Other malaise: Secondary | ICD-10-CM | POA: Diagnosis not present

## 2019-11-30 DIAGNOSIS — G9341 Metabolic encephalopathy: Secondary | ICD-10-CM | POA: Diagnosis not present

## 2019-11-30 DIAGNOSIS — I5030 Unspecified diastolic (congestive) heart failure: Secondary | ICD-10-CM | POA: Diagnosis not present

## 2019-11-30 DIAGNOSIS — E039 Hypothyroidism, unspecified: Secondary | ICD-10-CM | POA: Diagnosis not present

## 2019-11-30 DIAGNOSIS — I10 Essential (primary) hypertension: Secondary | ICD-10-CM | POA: Diagnosis not present

## 2019-11-30 DIAGNOSIS — R293 Abnormal posture: Secondary | ICD-10-CM | POA: Diagnosis not present

## 2019-11-30 DIAGNOSIS — I69851 Hemiplegia and hemiparesis following other cerebrovascular disease affecting right dominant side: Secondary | ICD-10-CM | POA: Diagnosis not present

## 2019-11-30 DIAGNOSIS — R791 Abnormal coagulation profile: Secondary | ICD-10-CM | POA: Diagnosis not present

## 2019-11-30 DIAGNOSIS — R5381 Other malaise: Secondary | ICD-10-CM | POA: Diagnosis not present

## 2019-11-30 DIAGNOSIS — M6281 Muscle weakness (generalized): Secondary | ICD-10-CM | POA: Diagnosis not present

## 2019-11-30 DIAGNOSIS — E785 Hyperlipidemia, unspecified: Secondary | ICD-10-CM | POA: Diagnosis not present

## 2019-11-30 DIAGNOSIS — I69898 Other sequelae of other cerebrovascular disease: Secondary | ICD-10-CM | POA: Diagnosis not present

## 2019-11-30 DIAGNOSIS — E46 Unspecified protein-calorie malnutrition: Secondary | ICD-10-CM | POA: Diagnosis not present

## 2019-11-30 DIAGNOSIS — Z7901 Long term (current) use of anticoagulants: Secondary | ICD-10-CM | POA: Diagnosis not present

## 2019-12-03 DIAGNOSIS — M6281 Muscle weakness (generalized): Secondary | ICD-10-CM | POA: Diagnosis not present

## 2019-12-03 DIAGNOSIS — R5381 Other malaise: Secondary | ICD-10-CM | POA: Diagnosis not present

## 2019-12-03 DIAGNOSIS — I5030 Unspecified diastolic (congestive) heart failure: Secondary | ICD-10-CM | POA: Diagnosis not present

## 2019-12-03 DIAGNOSIS — I69898 Other sequelae of other cerebrovascular disease: Secondary | ICD-10-CM | POA: Diagnosis not present

## 2019-12-03 DIAGNOSIS — R293 Abnormal posture: Secondary | ICD-10-CM | POA: Diagnosis not present

## 2019-12-03 DIAGNOSIS — I69851 Hemiplegia and hemiparesis following other cerebrovascular disease affecting right dominant side: Secondary | ICD-10-CM | POA: Diagnosis not present

## 2019-12-03 DIAGNOSIS — G9341 Metabolic encephalopathy: Secondary | ICD-10-CM | POA: Diagnosis not present

## 2019-12-04 DIAGNOSIS — I872 Venous insufficiency (chronic) (peripheral): Secondary | ICD-10-CM | POA: Diagnosis not present

## 2019-12-04 DIAGNOSIS — M6281 Muscle weakness (generalized): Secondary | ICD-10-CM | POA: Diagnosis not present

## 2019-12-04 DIAGNOSIS — G9341 Metabolic encephalopathy: Secondary | ICD-10-CM | POA: Diagnosis not present

## 2019-12-04 DIAGNOSIS — R5381 Other malaise: Secondary | ICD-10-CM | POA: Diagnosis not present

## 2019-12-04 DIAGNOSIS — I69898 Other sequelae of other cerebrovascular disease: Secondary | ICD-10-CM | POA: Diagnosis not present

## 2019-12-04 DIAGNOSIS — I5030 Unspecified diastolic (congestive) heart failure: Secondary | ICD-10-CM | POA: Diagnosis not present

## 2019-12-04 DIAGNOSIS — I7090 Unspecified atherosclerosis: Secondary | ICD-10-CM | POA: Diagnosis not present

## 2019-12-04 DIAGNOSIS — R293 Abnormal posture: Secondary | ICD-10-CM | POA: Diagnosis not present

## 2019-12-04 DIAGNOSIS — I7091 Generalized atherosclerosis: Secondary | ICD-10-CM | POA: Diagnosis not present

## 2019-12-04 DIAGNOSIS — B351 Tinea unguium: Secondary | ICD-10-CM | POA: Diagnosis not present

## 2019-12-04 DIAGNOSIS — I69851 Hemiplegia and hemiparesis following other cerebrovascular disease affecting right dominant side: Secondary | ICD-10-CM | POA: Diagnosis not present

## 2019-12-05 DIAGNOSIS — I69898 Other sequelae of other cerebrovascular disease: Secondary | ICD-10-CM | POA: Diagnosis not present

## 2019-12-05 DIAGNOSIS — I69851 Hemiplegia and hemiparesis following other cerebrovascular disease affecting right dominant side: Secondary | ICD-10-CM | POA: Diagnosis not present

## 2019-12-05 DIAGNOSIS — R5381 Other malaise: Secondary | ICD-10-CM | POA: Diagnosis not present

## 2019-12-05 DIAGNOSIS — G9341 Metabolic encephalopathy: Secondary | ICD-10-CM | POA: Diagnosis not present

## 2019-12-05 DIAGNOSIS — I5030 Unspecified diastolic (congestive) heart failure: Secondary | ICD-10-CM | POA: Diagnosis not present

## 2019-12-05 DIAGNOSIS — L89624 Pressure ulcer of left heel, stage 4: Secondary | ICD-10-CM | POA: Diagnosis not present

## 2019-12-05 DIAGNOSIS — M6281 Muscle weakness (generalized): Secondary | ICD-10-CM | POA: Diagnosis not present

## 2019-12-05 DIAGNOSIS — R293 Abnormal posture: Secondary | ICD-10-CM | POA: Diagnosis not present

## 2019-12-06 DIAGNOSIS — I69898 Other sequelae of other cerebrovascular disease: Secondary | ICD-10-CM | POA: Diagnosis not present

## 2019-12-06 DIAGNOSIS — M6281 Muscle weakness (generalized): Secondary | ICD-10-CM | POA: Diagnosis not present

## 2019-12-06 DIAGNOSIS — I5030 Unspecified diastolic (congestive) heart failure: Secondary | ICD-10-CM | POA: Diagnosis not present

## 2019-12-06 DIAGNOSIS — I69851 Hemiplegia and hemiparesis following other cerebrovascular disease affecting right dominant side: Secondary | ICD-10-CM | POA: Diagnosis not present

## 2019-12-06 DIAGNOSIS — R293 Abnormal posture: Secondary | ICD-10-CM | POA: Diagnosis not present

## 2019-12-06 DIAGNOSIS — G9341 Metabolic encephalopathy: Secondary | ICD-10-CM | POA: Diagnosis not present

## 2019-12-06 DIAGNOSIS — R5381 Other malaise: Secondary | ICD-10-CM | POA: Diagnosis not present

## 2019-12-07 DIAGNOSIS — R293 Abnormal posture: Secondary | ICD-10-CM | POA: Diagnosis not present

## 2019-12-07 DIAGNOSIS — G9341 Metabolic encephalopathy: Secondary | ICD-10-CM | POA: Diagnosis not present

## 2019-12-07 DIAGNOSIS — E119 Type 2 diabetes mellitus without complications: Secondary | ICD-10-CM | POA: Diagnosis not present

## 2019-12-07 DIAGNOSIS — I69851 Hemiplegia and hemiparesis following other cerebrovascular disease affecting right dominant side: Secondary | ICD-10-CM | POA: Diagnosis not present

## 2019-12-07 DIAGNOSIS — M6281 Muscle weakness (generalized): Secondary | ICD-10-CM | POA: Diagnosis not present

## 2019-12-07 DIAGNOSIS — I69898 Other sequelae of other cerebrovascular disease: Secondary | ICD-10-CM | POA: Diagnosis not present

## 2019-12-07 DIAGNOSIS — Z7901 Long term (current) use of anticoagulants: Secondary | ICD-10-CM | POA: Diagnosis not present

## 2019-12-07 DIAGNOSIS — I5030 Unspecified diastolic (congestive) heart failure: Secondary | ICD-10-CM | POA: Diagnosis not present

## 2019-12-07 DIAGNOSIS — R5381 Other malaise: Secondary | ICD-10-CM | POA: Diagnosis not present

## 2019-12-07 DIAGNOSIS — E039 Hypothyroidism, unspecified: Secondary | ICD-10-CM | POA: Diagnosis not present

## 2019-12-10 DIAGNOSIS — R5381 Other malaise: Secondary | ICD-10-CM | POA: Diagnosis not present

## 2019-12-10 DIAGNOSIS — G9341 Metabolic encephalopathy: Secondary | ICD-10-CM | POA: Diagnosis not present

## 2019-12-10 DIAGNOSIS — I69851 Hemiplegia and hemiparesis following other cerebrovascular disease affecting right dominant side: Secondary | ICD-10-CM | POA: Diagnosis not present

## 2019-12-10 DIAGNOSIS — I69898 Other sequelae of other cerebrovascular disease: Secondary | ICD-10-CM | POA: Diagnosis not present

## 2019-12-10 DIAGNOSIS — M6281 Muscle weakness (generalized): Secondary | ICD-10-CM | POA: Diagnosis not present

## 2019-12-10 DIAGNOSIS — I5030 Unspecified diastolic (congestive) heart failure: Secondary | ICD-10-CM | POA: Diagnosis not present

## 2019-12-10 DIAGNOSIS — R293 Abnormal posture: Secondary | ICD-10-CM | POA: Diagnosis not present

## 2019-12-11 DIAGNOSIS — I69898 Other sequelae of other cerebrovascular disease: Secondary | ICD-10-CM | POA: Diagnosis not present

## 2019-12-11 DIAGNOSIS — I5030 Unspecified diastolic (congestive) heart failure: Secondary | ICD-10-CM | POA: Diagnosis not present

## 2019-12-11 DIAGNOSIS — M6281 Muscle weakness (generalized): Secondary | ICD-10-CM | POA: Diagnosis not present

## 2019-12-11 DIAGNOSIS — R5381 Other malaise: Secondary | ICD-10-CM | POA: Diagnosis not present

## 2019-12-11 DIAGNOSIS — H903 Sensorineural hearing loss, bilateral: Secondary | ICD-10-CM | POA: Diagnosis not present

## 2019-12-11 DIAGNOSIS — I69851 Hemiplegia and hemiparesis following other cerebrovascular disease affecting right dominant side: Secondary | ICD-10-CM | POA: Diagnosis not present

## 2019-12-11 DIAGNOSIS — G9341 Metabolic encephalopathy: Secondary | ICD-10-CM | POA: Diagnosis not present

## 2019-12-11 DIAGNOSIS — R293 Abnormal posture: Secondary | ICD-10-CM | POA: Diagnosis not present

## 2019-12-11 DIAGNOSIS — H9313 Tinnitus, bilateral: Secondary | ICD-10-CM | POA: Diagnosis not present

## 2019-12-12 DIAGNOSIS — G9341 Metabolic encephalopathy: Secondary | ICD-10-CM | POA: Diagnosis not present

## 2019-12-12 DIAGNOSIS — L089 Local infection of the skin and subcutaneous tissue, unspecified: Secondary | ICD-10-CM | POA: Diagnosis not present

## 2019-12-12 DIAGNOSIS — L89624 Pressure ulcer of left heel, stage 4: Secondary | ICD-10-CM | POA: Diagnosis not present

## 2019-12-12 DIAGNOSIS — R293 Abnormal posture: Secondary | ICD-10-CM | POA: Diagnosis not present

## 2019-12-12 DIAGNOSIS — I5030 Unspecified diastolic (congestive) heart failure: Secondary | ICD-10-CM | POA: Diagnosis not present

## 2019-12-12 DIAGNOSIS — I69898 Other sequelae of other cerebrovascular disease: Secondary | ICD-10-CM | POA: Diagnosis not present

## 2019-12-12 DIAGNOSIS — I69851 Hemiplegia and hemiparesis following other cerebrovascular disease affecting right dominant side: Secondary | ICD-10-CM | POA: Diagnosis not present

## 2019-12-12 DIAGNOSIS — M6281 Muscle weakness (generalized): Secondary | ICD-10-CM | POA: Diagnosis not present

## 2019-12-12 DIAGNOSIS — R5381 Other malaise: Secondary | ICD-10-CM | POA: Diagnosis not present

## 2019-12-13 DIAGNOSIS — I69898 Other sequelae of other cerebrovascular disease: Secondary | ICD-10-CM | POA: Diagnosis not present

## 2019-12-13 DIAGNOSIS — I5030 Unspecified diastolic (congestive) heart failure: Secondary | ICD-10-CM | POA: Diagnosis not present

## 2019-12-13 DIAGNOSIS — R5381 Other malaise: Secondary | ICD-10-CM | POA: Diagnosis not present

## 2019-12-13 DIAGNOSIS — M6281 Muscle weakness (generalized): Secondary | ICD-10-CM | POA: Diagnosis not present

## 2019-12-13 DIAGNOSIS — R293 Abnormal posture: Secondary | ICD-10-CM | POA: Diagnosis not present

## 2019-12-13 DIAGNOSIS — I69851 Hemiplegia and hemiparesis following other cerebrovascular disease affecting right dominant side: Secondary | ICD-10-CM | POA: Diagnosis not present

## 2019-12-13 DIAGNOSIS — G9341 Metabolic encephalopathy: Secondary | ICD-10-CM | POA: Diagnosis not present

## 2019-12-14 DIAGNOSIS — I69898 Other sequelae of other cerebrovascular disease: Secondary | ICD-10-CM | POA: Diagnosis not present

## 2019-12-14 DIAGNOSIS — R791 Abnormal coagulation profile: Secondary | ICD-10-CM | POA: Diagnosis not present

## 2019-12-14 DIAGNOSIS — D649 Anemia, unspecified: Secondary | ICD-10-CM | POA: Diagnosis not present

## 2019-12-14 DIAGNOSIS — G9341 Metabolic encephalopathy: Secondary | ICD-10-CM | POA: Diagnosis not present

## 2019-12-14 DIAGNOSIS — Z5181 Encounter for therapeutic drug level monitoring: Secondary | ICD-10-CM | POA: Diagnosis not present

## 2019-12-14 DIAGNOSIS — E7212 Methylenetetrahydrofolate reductase deficiency: Secondary | ICD-10-CM | POA: Diagnosis not present

## 2019-12-14 DIAGNOSIS — M6281 Muscle weakness (generalized): Secondary | ICD-10-CM | POA: Diagnosis not present

## 2019-12-14 DIAGNOSIS — I69851 Hemiplegia and hemiparesis following other cerebrovascular disease affecting right dominant side: Secondary | ICD-10-CM | POA: Diagnosis not present

## 2019-12-14 DIAGNOSIS — Z7901 Long term (current) use of anticoagulants: Secondary | ICD-10-CM | POA: Diagnosis not present

## 2019-12-14 DIAGNOSIS — E119 Type 2 diabetes mellitus without complications: Secondary | ICD-10-CM | POA: Diagnosis not present

## 2019-12-14 DIAGNOSIS — R293 Abnormal posture: Secondary | ICD-10-CM | POA: Diagnosis not present

## 2019-12-14 DIAGNOSIS — I5030 Unspecified diastolic (congestive) heart failure: Secondary | ICD-10-CM | POA: Diagnosis not present

## 2019-12-14 DIAGNOSIS — R5381 Other malaise: Secondary | ICD-10-CM | POA: Diagnosis not present

## 2019-12-14 DIAGNOSIS — L089 Local infection of the skin and subcutaneous tissue, unspecified: Secondary | ICD-10-CM | POA: Diagnosis not present

## 2019-12-16 DIAGNOSIS — M6281 Muscle weakness (generalized): Secondary | ICD-10-CM | POA: Diagnosis not present

## 2019-12-16 DIAGNOSIS — R5381 Other malaise: Secondary | ICD-10-CM | POA: Diagnosis not present

## 2019-12-16 DIAGNOSIS — I5030 Unspecified diastolic (congestive) heart failure: Secondary | ICD-10-CM | POA: Diagnosis not present

## 2019-12-16 DIAGNOSIS — R293 Abnormal posture: Secondary | ICD-10-CM | POA: Diagnosis not present

## 2019-12-16 DIAGNOSIS — G9341 Metabolic encephalopathy: Secondary | ICD-10-CM | POA: Diagnosis not present

## 2019-12-16 DIAGNOSIS — I69898 Other sequelae of other cerebrovascular disease: Secondary | ICD-10-CM | POA: Diagnosis not present

## 2019-12-16 DIAGNOSIS — I69851 Hemiplegia and hemiparesis following other cerebrovascular disease affecting right dominant side: Secondary | ICD-10-CM | POA: Diagnosis not present

## 2019-12-17 DIAGNOSIS — I69851 Hemiplegia and hemiparesis following other cerebrovascular disease affecting right dominant side: Secondary | ICD-10-CM | POA: Diagnosis not present

## 2019-12-17 DIAGNOSIS — R609 Edema, unspecified: Secondary | ICD-10-CM | POA: Diagnosis not present

## 2019-12-17 DIAGNOSIS — R293 Abnormal posture: Secondary | ICD-10-CM | POA: Diagnosis not present

## 2019-12-17 DIAGNOSIS — B029 Zoster without complications: Secondary | ICD-10-CM | POA: Diagnosis not present

## 2019-12-17 DIAGNOSIS — M6281 Muscle weakness (generalized): Secondary | ICD-10-CM | POA: Diagnosis not present

## 2019-12-17 DIAGNOSIS — R5381 Other malaise: Secondary | ICD-10-CM | POA: Diagnosis not present

## 2019-12-17 DIAGNOSIS — I69898 Other sequelae of other cerebrovascular disease: Secondary | ICD-10-CM | POA: Diagnosis not present

## 2019-12-17 DIAGNOSIS — G9341 Metabolic encephalopathy: Secondary | ICD-10-CM | POA: Diagnosis not present

## 2019-12-17 DIAGNOSIS — I5030 Unspecified diastolic (congestive) heart failure: Secondary | ICD-10-CM | POA: Diagnosis not present

## 2019-12-18 DIAGNOSIS — I69898 Other sequelae of other cerebrovascular disease: Secondary | ICD-10-CM | POA: Diagnosis not present

## 2019-12-18 DIAGNOSIS — R293 Abnormal posture: Secondary | ICD-10-CM | POA: Diagnosis not present

## 2019-12-18 DIAGNOSIS — G9341 Metabolic encephalopathy: Secondary | ICD-10-CM | POA: Diagnosis not present

## 2019-12-18 DIAGNOSIS — I69851 Hemiplegia and hemiparesis following other cerebrovascular disease affecting right dominant side: Secondary | ICD-10-CM | POA: Diagnosis not present

## 2019-12-18 DIAGNOSIS — Z7901 Long term (current) use of anticoagulants: Secondary | ICD-10-CM | POA: Diagnosis not present

## 2019-12-18 DIAGNOSIS — M6281 Muscle weakness (generalized): Secondary | ICD-10-CM | POA: Diagnosis not present

## 2019-12-18 DIAGNOSIS — R5381 Other malaise: Secondary | ICD-10-CM | POA: Diagnosis not present

## 2019-12-18 DIAGNOSIS — I5030 Unspecified diastolic (congestive) heart failure: Secondary | ICD-10-CM | POA: Diagnosis not present

## 2019-12-19 DIAGNOSIS — I69851 Hemiplegia and hemiparesis following other cerebrovascular disease affecting right dominant side: Secondary | ICD-10-CM | POA: Diagnosis not present

## 2019-12-19 DIAGNOSIS — I69898 Other sequelae of other cerebrovascular disease: Secondary | ICD-10-CM | POA: Diagnosis not present

## 2019-12-19 DIAGNOSIS — R5381 Other malaise: Secondary | ICD-10-CM | POA: Diagnosis not present

## 2019-12-19 DIAGNOSIS — M6281 Muscle weakness (generalized): Secondary | ICD-10-CM | POA: Diagnosis not present

## 2019-12-19 DIAGNOSIS — L89153 Pressure ulcer of sacral region, stage 3: Secondary | ICD-10-CM | POA: Diagnosis not present

## 2019-12-19 DIAGNOSIS — I5030 Unspecified diastolic (congestive) heart failure: Secondary | ICD-10-CM | POA: Diagnosis not present

## 2019-12-19 DIAGNOSIS — G9341 Metabolic encephalopathy: Secondary | ICD-10-CM | POA: Diagnosis not present

## 2019-12-19 DIAGNOSIS — L89614 Pressure ulcer of right heel, stage 4: Secondary | ICD-10-CM | POA: Diagnosis not present

## 2019-12-19 DIAGNOSIS — R293 Abnormal posture: Secondary | ICD-10-CM | POA: Diagnosis not present

## 2019-12-20 DIAGNOSIS — E785 Hyperlipidemia, unspecified: Secondary | ICD-10-CM | POA: Diagnosis not present

## 2019-12-20 DIAGNOSIS — I5030 Unspecified diastolic (congestive) heart failure: Secondary | ICD-10-CM | POA: Diagnosis not present

## 2019-12-20 DIAGNOSIS — I4819 Other persistent atrial fibrillation: Secondary | ICD-10-CM | POA: Diagnosis not present

## 2019-12-20 DIAGNOSIS — I10 Essential (primary) hypertension: Secondary | ICD-10-CM | POA: Diagnosis not present

## 2019-12-21 DIAGNOSIS — R609 Edema, unspecified: Secondary | ICD-10-CM | POA: Diagnosis not present

## 2019-12-25 DIAGNOSIS — R791 Abnormal coagulation profile: Secondary | ICD-10-CM | POA: Diagnosis not present

## 2019-12-25 DIAGNOSIS — Z7901 Long term (current) use of anticoagulants: Secondary | ICD-10-CM | POA: Diagnosis not present

## 2019-12-25 DIAGNOSIS — E119 Type 2 diabetes mellitus without complications: Secondary | ICD-10-CM | POA: Diagnosis not present

## 2019-12-25 DIAGNOSIS — D649 Anemia, unspecified: Secondary | ICD-10-CM | POA: Diagnosis not present

## 2019-12-26 DIAGNOSIS — L89624 Pressure ulcer of left heel, stage 4: Secondary | ICD-10-CM | POA: Diagnosis not present

## 2019-12-26 DIAGNOSIS — L89614 Pressure ulcer of right heel, stage 4: Secondary | ICD-10-CM | POA: Diagnosis not present

## 2019-12-28 DIAGNOSIS — D649 Anemia, unspecified: Secondary | ICD-10-CM | POA: Diagnosis not present

## 2019-12-28 DIAGNOSIS — E119 Type 2 diabetes mellitus without complications: Secondary | ICD-10-CM | POA: Diagnosis not present

## 2019-12-28 DIAGNOSIS — Z7901 Long term (current) use of anticoagulants: Secondary | ICD-10-CM | POA: Diagnosis not present

## 2019-12-28 DIAGNOSIS — L089 Local infection of the skin and subcutaneous tissue, unspecified: Secondary | ICD-10-CM | POA: Diagnosis not present

## 2019-12-28 DIAGNOSIS — E7212 Methylenetetrahydrofolate reductase deficiency: Secondary | ICD-10-CM | POA: Diagnosis not present

## 2019-12-28 DIAGNOSIS — R791 Abnormal coagulation profile: Secondary | ICD-10-CM | POA: Diagnosis not present

## 2019-12-29 DIAGNOSIS — D649 Anemia, unspecified: Secondary | ICD-10-CM | POA: Diagnosis not present

## 2019-12-29 DIAGNOSIS — R790 Abnormal level of blood mineral: Secondary | ICD-10-CM | POA: Diagnosis not present

## 2019-12-29 DIAGNOSIS — E119 Type 2 diabetes mellitus without complications: Secondary | ICD-10-CM | POA: Diagnosis not present

## 2019-12-29 DIAGNOSIS — R791 Abnormal coagulation profile: Secondary | ICD-10-CM | POA: Diagnosis not present

## 2019-12-31 DIAGNOSIS — Z7901 Long term (current) use of anticoagulants: Secondary | ICD-10-CM | POA: Diagnosis not present

## 2019-12-31 DIAGNOSIS — R790 Abnormal level of blood mineral: Secondary | ICD-10-CM | POA: Diagnosis not present

## 2019-12-31 DIAGNOSIS — D649 Anemia, unspecified: Secondary | ICD-10-CM | POA: Diagnosis not present

## 2019-12-31 DIAGNOSIS — Z5181 Encounter for therapeutic drug level monitoring: Secondary | ICD-10-CM | POA: Diagnosis not present

## 2019-12-31 DIAGNOSIS — E119 Type 2 diabetes mellitus without complications: Secondary | ICD-10-CM | POA: Diagnosis not present

## 2019-12-31 DIAGNOSIS — R791 Abnormal coagulation profile: Secondary | ICD-10-CM | POA: Diagnosis not present

## 2019-12-31 DIAGNOSIS — R770 Abnormality of albumin: Secondary | ICD-10-CM | POA: Diagnosis not present

## 2020-01-02 ENCOUNTER — Encounter (HOSPITAL_COMMUNITY): Payer: Self-pay | Admitting: *Deleted

## 2020-01-02 ENCOUNTER — Emergency Department (HOSPITAL_COMMUNITY)
Admission: EM | Admit: 2020-01-02 | Discharge: 2020-01-02 | Disposition: A | Discharge status: Home / Self Care | Payer: Medicare HMO | Attending: Emergency Medicine | Admitting: Emergency Medicine

## 2020-01-02 ENCOUNTER — Other Ambulatory Visit: Payer: Self-pay

## 2020-01-02 DIAGNOSIS — R5381 Other malaise: Secondary | ICD-10-CM | POA: Diagnosis not present

## 2020-01-02 DIAGNOSIS — Z79890 Hormone replacement therapy (postmenopausal): Secondary | ICD-10-CM | POA: Diagnosis not present

## 2020-01-02 DIAGNOSIS — R7889 Finding of other specified substances, not normally found in blood: Secondary | ICD-10-CM | POA: Diagnosis not present

## 2020-01-02 DIAGNOSIS — I10 Essential (primary) hypertension: Secondary | ICD-10-CM | POA: Diagnosis not present

## 2020-01-02 DIAGNOSIS — R29898 Other symptoms and signs involving the musculoskeletal system: Secondary | ICD-10-CM | POA: Diagnosis not present

## 2020-01-02 DIAGNOSIS — E039 Hypothyroidism, unspecified: Secondary | ICD-10-CM | POA: Insufficient documentation

## 2020-01-02 DIAGNOSIS — D500 Iron deficiency anemia secondary to blood loss (chronic): Secondary | ICD-10-CM | POA: Diagnosis not present

## 2020-01-02 DIAGNOSIS — I959 Hypotension, unspecified: Secondary | ICD-10-CM | POA: Diagnosis not present

## 2020-01-02 DIAGNOSIS — Z7401 Bed confinement status: Secondary | ICD-10-CM | POA: Diagnosis not present

## 2020-01-02 DIAGNOSIS — R404 Transient alteration of awareness: Secondary | ICD-10-CM | POA: Diagnosis not present

## 2020-01-02 DIAGNOSIS — Z79899 Other long term (current) drug therapy: Secondary | ICD-10-CM | POA: Diagnosis not present

## 2020-01-02 DIAGNOSIS — D509 Iron deficiency anemia, unspecified: Secondary | ICD-10-CM | POA: Diagnosis not present

## 2020-01-02 DIAGNOSIS — R531 Weakness: Secondary | ICD-10-CM | POA: Diagnosis present

## 2020-01-02 LAB — BASIC METABOLIC PANEL
Anion gap: 5 (ref 5–15)
BUN: 13 mg/dL (ref 6–20)
CO2: 30 mmol/L (ref 22–32)
Calcium: 8.7 mg/dL — ABNORMAL LOW (ref 8.9–10.3)
Chloride: 102 mmol/L (ref 98–111)
Creatinine, Ser: 0.59 mg/dL — ABNORMAL LOW (ref 0.61–1.24)
GFR calc Af Amer: >60 mL/min (ref >60)
GFR calc non Af Amer: >60 mL/min (ref >60)
Glucose, Bld: 106 mg/dL — ABNORMAL HIGH (ref 70–99)
Potassium: 3.5 mmol/L (ref 3.5–5.1)
Sodium: 137 mmol/L (ref 135–145)

## 2020-01-02 LAB — PROTIME-INR
INR: 3.8 — ABNORMAL HIGH (ref 0.8–1.2)
Prothrombin Time: 36.5 seconds — ABNORMAL HIGH (ref 11.4–15.2)

## 2020-01-02 LAB — CBC WITH DIFFERENTIAL/PLATELET
Abs Immature Granulocytes: 0.03 10*3/uL (ref 0.00–0.07)
Basophils Absolute: 0 10*3/uL (ref 0.0–0.1)
Basophils Relative: 1 %
Eosinophils Absolute: 0.1 10*3/uL (ref 0.0–0.5)
Eosinophils Relative: 3 %
HCT: 30.6 % — ABNORMAL LOW (ref 39.0–52.0)
Hemoglobin: 9.6 g/dL — ABNORMAL LOW (ref 13.0–17.0)
Immature Granulocytes: 1 %
Lymphocytes Relative: 41 %
Lymphs Abs: 1.2 10*3/uL (ref 0.7–4.0)
MCH: 31.6 pg (ref 26.0–34.0)
MCHC: 31.4 g/dL (ref 30.0–36.0)
MCV: 100.7 fL — ABNORMAL HIGH (ref 80.0–100.0)
Monocytes Absolute: 0.3 10*3/uL (ref 0.1–1.0)
Monocytes Relative: 11 %
Neutro Abs: 1.3 10*3/uL — ABNORMAL LOW (ref 1.7–7.7)
Neutrophils Relative %: 43 %
Platelets: 85 10*3/uL — ABNORMAL LOW (ref 150–400)
RBC: 3.04 MIL/uL — ABNORMAL LOW (ref 4.22–5.81)
RDW: 17.2 % — ABNORMAL HIGH (ref 11.5–15.5)
WBC: 3 10*3/uL — ABNORMAL LOW (ref 4.0–10.5)
nRBC: 0 % (ref 0.0–0.2)

## 2020-01-02 LAB — POC OCCULT BLOOD, ED: Fecal Occult Bld: NEGATIVE

## 2020-01-02 MED ORDER — SODIUM CHLORIDE 0.9 % IV BOLUS
1000.0000 mL | Freq: Once | INTRAVENOUS | Status: AC
  Administered 2020-01-02: 1000 mL via INTRAVENOUS

## 2020-01-02 NOTE — ED Provider Notes (Signed)
[facility] EMERGENCY DEPARTMENT Provider Note   CSN: [id number] Arrival date & time: 01/02/20  1058     History Chief Complaint  Patient presents with  . Abnormal Lab    Bryce Hill is a 59 y.o. male.  Patient was sent to the emergency department from the nursing home because his INR was elevated.  No history of any bleeding  The history is provided by the nursing home and medical records. No language interpreter was used.  Weakness Severity:  Mild Onset quality:  Unable to specify Timing:  Constant Progression:  Waxing and waning Chronicity:  New Context: not alcohol use   Relieved by:  Nothing Worsened by:  Nothing Ineffective treatments:  None tried      Past Medical History:  Diagnosis Date  . Atrial fibrillation ([place])   . CVA (cerebral vascular accident) ([place])   . Hypertension   . Hypothyroidism   . Pancytopenia ([place]) 01/11/2012   Stable  . Thrombocytopenia [facility])     Patient Active Problem List   Diagnosis Date Noted  . Hypertension 06/27/2019  . Colon cancer screening   . Diverticulosis of colon without hemorrhage   . Encounter for screening colonoscopy 03/25/2015  . Left-sided low back pain without sciatica 01/07/2014  . Pancytopenia ([place]) 01/11/2012    Past Surgical History:  Procedure Laterality Date  . COLONOSCOPY N/A 04/22/2015   Procedure: COLONOSCOPY;  Surgeon: Daneil Dolin, MD;  Location: [facility];  Service: Endoscopy;  Laterality: N/A;  1245  . HERNIA REPAIR     as child       Family History  Problem Relation Age of Onset  . Cancer Mother   . Colon cancer Other        unknown but THINKS his mom may have had    Social History   Tobacco Use  . Smoking status: Never Smoker  . Smokeless tobacco: Never Used  Substance Use Topics  . Alcohol use: No    Alcohol/week: 0.0 standard drinks  . Drug use: No    Home Medications Prior to Admission medications   Medication Sig Start Date End Date Taking? Authorizing Provider    acetaminophen (TYLENOL) 325 MG tablet Take 650 mg by mouth every 6 (six) hours as needed.    [provider]  carvedilol (COREG) 12.5 MG tablet Take 12.5 mg by mouth 2 (two) times daily with a meal.      [provider]  furosemide (LASIX) 20 MG tablet  06/20/14   [provider]  levothyroxine (SYNTHROID, LEVOTHROID) 125 MCG tablet  12/16/15   [provider]  lisinopril (ZESTRIL) 5 MG tablet Take 5 mg by mouth daily. 11/14/18   [provider]  loratadine (CLARITIN) 10 MG tablet Take 10 mg by mouth daily.    [provider]  polyethylene glycol-electrolytes (NULYTELY/GOLYTELY) 420 G solution Take 4,000 mLs by mouth once. 04/02/15   Annitta Needs, NP  potassium chloride SA (K-DUR,KLOR-CON) 20 MEQ tablet Take 20 mEq by mouth 2 (two) times daily.      [provider]  VENTOLIN HFA 108 [id number] Base) MCG/ACT inhaler  12/30/17   [provider]  warfarin (COUMADIN) 5 MG tablet Take 5 mg by mouth daily. 5 mg MWF; 7.5 mg Tuesday, Thurs, Sat, Sun    [provider]    Allergies    Patient has no known allergies.  Review of Systems   Review of Systems  Unable to perform ROS: Mental status change  Physical Exam Updated Vital Signs BP (!) 95/56 (BP Location: Left Arm)   Pulse 98   Temp 98 F (36.7 C) (Oral)   Resp 16   SpO2 97%   Physical Exam Vitals and nursing note reviewed.  Constitutional:      Appearance: He is well-developed.  HENT:     Head: Normocephalic.     Nose: Nose normal.  Eyes:     General: No scleral icterus.    Conjunctiva/sclera: Conjunctivae normal.  Neck:     Thyroid: No thyromegaly.  Cardiovascular:     Rate and Rhythm: Normal rate and regular rhythm.     Heart sounds: No murmur heard.  No friction rub. No gallop.   Pulmonary:     Breath sounds: No stridor. No wheezing or rales.  Chest:     Chest wall: No tenderness.  Abdominal:     General: There is no distension.     Tenderness:  There is no abdominal tenderness. There is no rebound.  Genitourinary:    Comments: Rectal exam normal with heme-negative Musculoskeletal:     Cervical back: Neck supple.  Lymphadenopathy:     Cervical: No cervical adenopathy.  Skin:    Findings: No erythema or rash.  Neurological:     Mental Status: He is alert.     Motor: No abnormal muscle tone.     Coordination: Coordination normal.     Comments: Oriented to person place  Psychiatric:        Behavior: Behavior normal.     ED Results / Procedures / Treatments   Labs (all labs ordered are listed, but only abnormal results are displayed) Labs Reviewed  CBC WITH DIFFERENTIAL/PLATELET - Abnormal; Notable for the following components:      Result Value   WBC 3.0 (*)    RBC 3.04 (*)    Hemoglobin 9.6 (*)    HCT 30.6 (*)    MCV 100.7 (*)    RDW 17.2 (*)    Platelets 85 (*)    Neutro Abs 1.3 (*)    All other components within normal limits  BASIC METABOLIC PANEL - Abnormal; Notable for the following components:   Glucose, Bld 106 (*)    Creatinine, Ser 0.59 (*)    Calcium 8.7 (*)    All other components within normal limits  PROTIME-INR - Abnormal; Notable for the following components:   Prothrombin Time 36.5 (*)    INR 3.8 (*)    All other components within normal limits  OCCULT BLOOD X 1 CARD TO LAB, STOOL  PATHOLOGIST SMEAR REVIEW  POC OCCULT BLOOD, ED    EKG None  Radiology No results found.  Procedures Procedures (including critical care time)  Medications Ordered in ED Medications  sodium chloride 0.9 % bolus 1,000 mL (1,000 mLs Intravenous New Bag/Given 01/02/20 1150)    ED Course  I have reviewed the triage vital signs and the nursing notes.  Pertinent labs & imaging results that were available during my care of the patient were reviewed by me and considered in my medical decision making (see chart for details).    MDM Rules/Calculators/A&P                          Patient with elevated INR.   Patient had mild anemia but heme-negative stool.  Patient was hydrated with some fluids and sent back to nursing home and told to hold his Coumadin for couple days and ask  his physician about starting the Coumadin      This patient presents to the ED for concern of elevated BP, this involves an extensive number of treatment options, and is a complaint that carries with it a high risk of complications and morbidity.  The differential diagnosis includes to much Coumadin   Lab Tests:   I Ordered, reviewed, and interpreted labs, which included CBC chemistries that showed hemoglobin low at 9.6.  Chemistries unremarkable and INR 3.8 elevated  Medicines ordered:   I ordered medication normal saline for dehydration  Imaging Studies ordered:  I Additional history obtained:   Additional history obtained from nursing home records  Previous records obtained and reviewed.  Consultations Obtained:    Reevaluation:  After the interventions stated above, I reevaluated the patient and found improved  Critical Interventions:  .   Final Clinical Impression(s) / ED Diagnoses Final diagnoses:  Iron deficiency anemia due to chronic blood loss    Rx / DC Orders ED Discharge Orders    None       Milton Ferguson, MD 01/02/20 1732

## 2020-01-02 NOTE — ED Notes (Signed)
Called for EMS transfer of Pt back to [facility]

## 2020-01-02 NOTE — ED Triage Notes (Signed)
Pt sent here for abnormal labs INR 3.5 and PT 36.4 from [facility] of [place].

## 2020-01-02 NOTE — Discharge Instructions (Addendum)
Do not take your Coumadin today or tomorrow.  Contact your physician in the next couple days to find out when he wants you to start back on your Coumadin

## 2020-01-03 DIAGNOSIS — D61818 Other pancytopenia: Secondary | ICD-10-CM | POA: Diagnosis not present

## 2020-01-03 DIAGNOSIS — R791 Abnormal coagulation profile: Secondary | ICD-10-CM | POA: Diagnosis not present

## 2020-01-03 LAB — PATHOLOGIST SMEAR REVIEW

## 2020-01-05 DIAGNOSIS — E119 Type 2 diabetes mellitus without complications: Secondary | ICD-10-CM | POA: Diagnosis not present

## 2020-01-05 DIAGNOSIS — R790 Abnormal level of blood mineral: Secondary | ICD-10-CM | POA: Diagnosis not present

## 2020-01-05 DIAGNOSIS — I499 Cardiac arrhythmia, unspecified: Secondary | ICD-10-CM | POA: Diagnosis not present

## 2020-01-05 DIAGNOSIS — Z792 Long term (current) use of antibiotics: Secondary | ICD-10-CM | POA: Diagnosis not present

## 2020-01-05 DIAGNOSIS — L089 Local infection of the skin and subcutaneous tissue, unspecified: Secondary | ICD-10-CM | POA: Diagnosis not present

## 2020-01-05 DIAGNOSIS — Z7901 Long term (current) use of anticoagulants: Secondary | ICD-10-CM | POA: Diagnosis not present

## 2020-01-05 DIAGNOSIS — I10 Essential (primary) hypertension: Secondary | ICD-10-CM | POA: Diagnosis not present

## 2020-01-07 DIAGNOSIS — Z7901 Long term (current) use of anticoagulants: Secondary | ICD-10-CM | POA: Diagnosis not present

## 2020-01-09 DIAGNOSIS — L89624 Pressure ulcer of left heel, stage 4: Secondary | ICD-10-CM | POA: Diagnosis not present

## 2020-01-10 DIAGNOSIS — E039 Hypothyroidism, unspecified: Secondary | ICD-10-CM | POA: Diagnosis not present

## 2020-01-10 DIAGNOSIS — E785 Hyperlipidemia, unspecified: Secondary | ICD-10-CM | POA: Diagnosis not present

## 2020-01-10 DIAGNOSIS — R5381 Other malaise: Secondary | ICD-10-CM | POA: Diagnosis not present

## 2020-01-10 DIAGNOSIS — I10 Essential (primary) hypertension: Secondary | ICD-10-CM | POA: Diagnosis not present

## 2020-01-11 DIAGNOSIS — L89624 Pressure ulcer of left heel, stage 4: Secondary | ICD-10-CM | POA: Diagnosis not present

## 2020-01-11 DIAGNOSIS — L89614 Pressure ulcer of right heel, stage 4: Secondary | ICD-10-CM | POA: Diagnosis not present

## 2020-01-14 DIAGNOSIS — I499 Cardiac arrhythmia, unspecified: Secondary | ICD-10-CM | POA: Diagnosis not present

## 2020-01-14 DIAGNOSIS — Z7901 Long term (current) use of anticoagulants: Secondary | ICD-10-CM | POA: Diagnosis not present

## 2020-01-14 DIAGNOSIS — R791 Abnormal coagulation profile: Secondary | ICD-10-CM | POA: Diagnosis not present

## 2020-01-14 DIAGNOSIS — Z5181 Encounter for therapeutic drug level monitoring: Secondary | ICD-10-CM | POA: Diagnosis not present

## 2020-01-16 DIAGNOSIS — I499 Cardiac arrhythmia, unspecified: Secondary | ICD-10-CM | POA: Diagnosis not present

## 2020-01-16 DIAGNOSIS — I10 Essential (primary) hypertension: Secondary | ICD-10-CM | POA: Diagnosis not present

## 2020-01-16 DIAGNOSIS — L89624 Pressure ulcer of left heel, stage 4: Secondary | ICD-10-CM | POA: Diagnosis not present

## 2020-01-16 DIAGNOSIS — L089 Local infection of the skin and subcutaneous tissue, unspecified: Secondary | ICD-10-CM | POA: Diagnosis not present

## 2020-01-16 DIAGNOSIS — L89614 Pressure ulcer of right heel, stage 4: Secondary | ICD-10-CM | POA: Diagnosis not present

## 2020-01-16 DIAGNOSIS — R790 Abnormal level of blood mineral: Secondary | ICD-10-CM | POA: Diagnosis not present

## 2020-01-23 ENCOUNTER — Other Ambulatory Visit (HOSPITAL_COMMUNITY): Payer: Self-pay

## 2020-01-23 DIAGNOSIS — D61818 Other pancytopenia: Secondary | ICD-10-CM

## 2020-01-24 ENCOUNTER — Other Ambulatory Visit: Payer: Self-pay

## 2020-01-24 ENCOUNTER — Inpatient Hospital Stay (HOSPITAL_BASED_OUTPATIENT_CLINIC_OR_DEPARTMENT_OTHER): Payer: Medicare HMO | Admitting: Oncology

## 2020-01-24 ENCOUNTER — Inpatient Hospital Stay (HOSPITAL_COMMUNITY): Payer: Medicare HMO | Attending: Hematology

## 2020-01-24 VITALS — BP 94/63 | HR 77 | Temp 96.8°F | Resp 16

## 2020-01-24 DIAGNOSIS — R918 Other nonspecific abnormal finding of lung field: Secondary | ICD-10-CM | POA: Insufficient documentation

## 2020-01-24 DIAGNOSIS — D61818 Other pancytopenia: Secondary | ICD-10-CM | POA: Diagnosis not present

## 2020-01-24 DIAGNOSIS — R911 Solitary pulmonary nodule: Secondary | ICD-10-CM

## 2020-01-24 DIAGNOSIS — D696 Thrombocytopenia, unspecified: Secondary | ICD-10-CM | POA: Insufficient documentation

## 2020-01-24 DIAGNOSIS — M7989 Other specified soft tissue disorders: Secondary | ICD-10-CM | POA: Insufficient documentation

## 2020-01-24 LAB — CBC WITH DIFFERENTIAL/PLATELET
Abs Immature Granulocytes: 0.01 10*3/uL (ref 0.00–0.07)
Basophils Absolute: 0 10*3/uL (ref 0.0–0.1)
Basophils Relative: 1 %
Eosinophils Absolute: 0.2 10*3/uL (ref 0.0–0.5)
Eosinophils Relative: 6 %
HCT: 32 % — ABNORMAL LOW (ref 39.0–52.0)
Hemoglobin: 10.1 g/dL — ABNORMAL LOW (ref 13.0–17.0)
Immature Granulocytes: 0 %
Lymphocytes Relative: 29 %
Lymphs Abs: 0.9 10*3/uL (ref 0.7–4.0)
MCH: 31.6 pg (ref 26.0–34.0)
MCHC: 31.6 g/dL (ref 30.0–36.0)
MCV: 100 fL (ref 80.0–100.0)
Monocytes Absolute: 0.3 10*3/uL (ref 0.1–1.0)
Monocytes Relative: 11 %
Neutro Abs: 1.6 10*3/uL — ABNORMAL LOW (ref 1.7–7.7)
Neutrophils Relative %: 53 %
Platelets: 115 10*3/uL — ABNORMAL LOW (ref 150–400)
RBC: 3.2 MIL/uL — ABNORMAL LOW (ref 4.22–5.81)
RDW: 18.3 % — ABNORMAL HIGH (ref 11.5–15.5)
WBC: 3 10*3/uL — ABNORMAL LOW (ref 4.0–10.5)
nRBC: 0 % (ref 0.0–0.2)

## 2020-01-24 LAB — COMPREHENSIVE METABOLIC PANEL
ALT: 14 U/L (ref 0–44)
AST: 27 U/L (ref 15–41)
Albumin: 2.7 g/dL — ABNORMAL LOW (ref 3.5–5.0)
Alkaline Phosphatase: 37 U/L — ABNORMAL LOW (ref 38–126)
Anion gap: 7 (ref 5–15)
BUN: 12 mg/dL (ref 6–20)
CO2: 29 mmol/L (ref 22–32)
Calcium: 8.4 mg/dL — ABNORMAL LOW (ref 8.9–10.3)
Chloride: 99 mmol/L (ref 98–111)
Creatinine, Ser: 0.51 mg/dL — ABNORMAL LOW (ref 0.61–1.24)
GFR calc non Af Amer: >60 mL/min (ref >60)
Glucose, Bld: 97 mg/dL (ref 70–99)
Potassium: 3.3 mmol/L — ABNORMAL LOW (ref 3.5–5.1)
Sodium: 135 mmol/L (ref 135–145)
Total Bilirubin: 1.3 mg/dL — ABNORMAL HIGH (ref 0.3–1.2)
Total Protein: 8.2 g/dL — ABNORMAL HIGH (ref 6.5–8.1)

## 2020-01-24 LAB — LACTATE DEHYDROGENASE: LDH: 215 U/L — ABNORMAL HIGH (ref 98–192)

## 2020-01-24 LAB — VITAMIN D 25 HYDROXY (VIT D DEFICIENCY, FRACTURES): Vit D, 25-Hydroxy: 28.96 ng/mL — ABNORMAL LOW (ref 30–100)

## 2020-01-24 LAB — IRON AND TIBC
Iron: 36 ug/dL — ABNORMAL LOW (ref 45–182)
Saturation Ratios: 20 % (ref 17.9–39.5)
TIBC: 181 ug/dL — ABNORMAL LOW (ref 250–450)
UIBC: 145 ug/dL

## 2020-01-24 LAB — VITAMIN B12: Vitamin B-12: 299 pg/mL (ref 180–914)

## 2020-01-24 LAB — FERRITIN: Ferritin: 232 ng/mL (ref 24–336)

## 2020-01-24 LAB — FOLATE: Folate: 9.6 ng/mL (ref >5.9)

## 2020-01-24 NOTE — Progress Notes (Signed)
[place], [place] [address]   CLINIC:  Medical Oncology/Hematology  PCP:  [name], MD [place] [address] [phone]   REASON FOR VISIT: Follow-up for pancytopenia  CURRENT THERAPY: Observation   INTERVAL HISTORY:  Bryce Hill 59 y.o. male returns for routine follow-up for pancytopenia.  Patient is accompanied by a nurse from his nursing facility.  He reports that he has been feeling well and denies any complaints.  He denies any unusual bleeding, nausea, vomiting or diarrhea.  He denies any new pains.  He is followed by his PCP for bilateral lower extremity swelling.  He is unable to bear weight on his legs.  They use a left at his facility to move him around.  He is able to feed himself.  His appetite is good.  He is incontinent of both stool and urine.  REVIEW OF SYSTEMS:  Review of Systems  Constitutional: Negative.  Negative for appetite change, chills, fatigue and fever.  HENT:  Negative.  Negative for hearing loss, lump/mass, mouth sores and nosebleeds.   Eyes: Negative.  Negative for eye problems.  Respiratory: Negative for cough, hemoptysis and shortness of breath.   Cardiovascular: Positive for leg swelling. Negative for chest pain.  Gastrointestinal: Negative.  Negative for abdominal pain, blood in stool, constipation, diarrhea, nausea and vomiting.  Endocrine: Negative.  Negative for hot flashes.  Genitourinary: Negative.  Negative for bladder incontinence, difficulty urinating, dysuria, frequency and hematuria.   Musculoskeletal: Negative.  Negative for back pain, flank pain, gait problem and myalgias.  Skin: Negative.  Negative for itching and rash.  Neurological: Negative.  Negative for dizziness, gait problem, headaches, light-headedness and numbness.  Hematological: Negative.  Negative for adenopathy.  Psychiatric/Behavioral: Positive for confusion. The patient is not nervous/anxious.   All  other systems reviewed and are negative.    PAST MEDICAL/SURGICAL HISTORY:  Past Medical History:  Diagnosis Date   Atrial fibrillation [facility])    CVA (cerebral vascular accident) ([place])    Hypertension    Hypothyroidism    Pancytopenia ([place]) 01/11/2012   Stable   Thrombocytopenia ([place])    Past Surgical History:  Procedure Laterality Date   COLONOSCOPY N/A 04/22/2015   Procedure: COLONOSCOPY;  Surgeon: Daneil Dolin, MD;  Location[facility];  Service: Endoscopy;  Laterality: N/A;  [place]     as child     SOCIAL HISTORY:  Social History   Socioeconomic History   Marital status: Single    Spouse name: Not on file   Number of children: Not on file   Years of education: Not on file   Highest education level: Not on file  Occupational History   Not on file  Tobacco Use   Smoking status: Never Smoker   Smokeless tobacco: Never Used  Substance and Sexual Activity   Alcohol use: No    Alcohol/week: 0.0 standard drinks   Drug use: No   Sexual activity: Not on file  Other Topics Concern   Not on file  Social History Narrative   Not on file   Social Determinants of Health   Financial Resource Strain:    Difficulty of Paying Living Expenses: Not on file  Food Insecurity:    Worried About [place] in the Last Year: Not on file   Ran Out of Food in the Last Year: Not on file  Transportation Needs:    Lack of Transportation (Medical):  Not on file   Lack of Transportation (Non-Medical): Not on file  Physical Activity:    Days of Exercise per Week: Not on file   Minutes of Exercise per Session: Not on file  Stress:    Feeling of Stress : Not on file  Social Connections:    Frequency of Communication with Friends and Family: Not on file   Frequency of Social Gatherings with Friends and Family: Not on file   Attends Religious Services: Not on file   Active Member of Clubs or Organizations: Not on file   Attends  Archivist Meetings: Not on file   Marital Status: Not on file  Intimate Partner Violence:    Fear of Current or Ex-Partner: Not on file   Emotionally Abused: Not on file   Physically Abused: Not on file   Sexually Abused: Not on file    FAMILY HISTORY:  Family History  Problem Relation Age of Onset   Cancer Mother    Colon cancer Other        unknown but THINKS his mom may have had    CURRENT MEDICATIONS:  Outpatient Encounter Medications as of 01/24/2020  Medication Sig Note   acetaminophen (TYLENOL) 325 MG tablet Take 650 mg by mouth every 6 (six) hours as needed.    ascorbic acid (VITAMIN C) 500 MG tablet Take 500 mg by mouth daily.    carvedilol (COREG) 12.5 MG tablet Take 12.5 mg by mouth 2 (two) times daily with a meal.      furosemide (LASIX) 20 MG tablet 40 mg.     levothyroxine (SYNTHROID) 137 MCG tablet Take 137 mcg by mouth daily.    lidocaine (LMX) 4 % cream Apply 1 application topically as needed.    lisinopril (ZESTRIL) 5 MG tablet Take 5 mg by mouth daily.    loratadine (CLARITIN) 10 MG tablet Take 10 mg by mouth daily.    polyethylene glycol-electrolytes (NULYTELY/GOLYTELY) 420 G solution Take 4,000 mLs by mouth once.    potassium chloride SA (K-DUR,KLOR-CON) 20 MEQ tablet Take 40 mEq by mouth 2 (two) times daily.     rosuvastatin (CRESTOR) 20 MG tablet Take 20 mg by mouth at bedtime.    VENTOLIN HFA 108 (90 Base) MCG/ACT inhaler     warfarin (COUMADIN) 5 MG tablet Take 5 mg by mouth daily. 5 mg MWF; 7.5 mg Tuesday, Thurs, Sat, Sun    [DISCONTINUED] levothyroxine (SYNTHROID, LEVOTHROID) 125 MCG tablet  01/08/2016: Received from: External Pharmacy   No facility-administered encounter medications on file as of 01/24/2020.    ALLERGIES:  No Known Allergies  PHYSICAL EXAM:  ECOG Performance status: 1  Vitals:   01/24/20 1125  BP: 94/63  Pulse: 77  Resp: 16  Temp: (!) 96.8 F (36 C)  SpO2: 100%   There were no vitals filed  for this visit.  Physical Exam Constitutional:      Appearance: Normal appearance. He is normal weight.  Cardiovascular:     Rate and Rhythm: Normal rate and regular rhythm.     Heart sounds: Normal heart sounds.  Pulmonary:     Effort: Pulmonary effort is normal.     Breath sounds: Normal breath sounds.  Abdominal:     General: Bowel sounds are normal.     Palpations: Abdomen is soft.  Musculoskeletal:        General: Normal range of motion.  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  Skin:  General: Skin is warm and dry.  Neurological:     Mental Status: He is alert and oriented to person, place, and time. Mental status is at baseline.  Psychiatric:        Mood and Affect: Mood normal.        Behavior: Behavior normal.        Thought Content: Thought content normal.        Judgment: Judgment normal.    LABORATORY DATA:  I have reviewed the labs as listed.  CBC    Component Value Date/Time   WBC 3.0 (L) 01/24/2020 1056   RBC 3.20 (L) 01/24/2020 1056   HGB 10.1 (L) 01/24/2020 1056   HGB 13.1 02/18/2012 1619   HCT 32.0 (L) 01/24/2020 1056   HCT 38.7 02/18/2012 1619   PLT 115 (L) 01/24/2020 1056   PLT 64 (L) 02/18/2012 1619   MCV 100.0 01/24/2020 1056   MCV 96.4 02/18/2012 1619   MCH 31.6 01/24/2020 1056   MCHC 31.6 01/24/2020 1056   RDW 18.3 (H) 01/24/2020 1056   RDW 15.1 (H) 02/18/2012 1619   LYMPHSABS 0.9 01/24/2020 1056   LYMPHSABS 0.8 (L) 02/18/2012 1619   MONOABS 0.3 01/24/2020 1056   MONOABS 0.4 02/18/2012 1619   EOSABS 0.2 01/24/2020 1056   EOSABS 0.1 02/18/2012 1619   BASOSABS 0.0 01/24/2020 1056   BASOSABS 0.0 02/18/2012 1619   CMP Latest Ref Rng & Units 01/24/2020 01/02/2020 11/22/2018  Glucose 70 - 99 mg/dL 97 106(H) 89  BUN 6 - 20 mg/dL 12 13 13   Creatinine 0.61 - 1.24 mg/dL 0.51(L) 0.59(L) 0.92  Sodium 135 - 145 mmol/L 135 137 137  Potassium 3.5 - 5.1 mmol/L 3.3(L) 3.5 4.0  Chloride 98 - 111 mmol/L 99 102 102    CO2 22 - 32 mmol/L 29 30 27   Calcium 8.9 - 10.3 mg/dL 8.4(L) 8.7(L) 8.8(L)  Total Protein 6.5 - 8.1 g/dL 8.2(H) - 8.2(H)  Total Bilirubin 0.3 - 1.2 mg/dL 1.3(H) - 1.8(H)  Alkaline Phos 38 - 126 U/L 37(L) - 38  AST 15 - 41 U/L 27 - 20  ALT 0 - 44 U/L 14 - 14   I personally performed a face-to-face visit.  All questions were answered to patient's stated satisfaction. Encouraged patient to call with any new concerns or questions before his next visit to the cancer center and we can certain see him sooner, if needed.    ASSESSMENT & PLAN:   Pancytopenia ([place]) 1.  Thrombocytopenia: - He was previously followed by Dr. Whitney Muse for pancytopenia.  She ordered a bone marrow aspiration and biopsy on 11/25/2008 which was negative for any cytogenetic abnormalities.  - Patient had leukopenia and thrombocytopenia in 2009.  It appears he started having anemia in 2010. -Labs labs are as follows: 06/23/2018 hemoglobin 12.2, platelets 78,000 07/11/2018 hemoglobin 13.2, platelets 57,000 11/22/2018 hemoglobin 11.4, platelets 73,000, ferritin 264 01/02/2020 hemoglobin 10.1, platelets-115,000, ferritin 232 -Return to clinic in 6 months for lab work and MD assessment  2.  Multiple pulmonary nodules: -Patient had CT CAP on 12/15/2017 which showed multiple nodules involving the left lung, the largest measuring 7 mm.  There are no nodules identified on the right lung. - It is recommended he have a noncontrast chest CT at 3 to 6 months.  - CT chest was repeated 05/24/2018.  Showed scattered solid pulmonary nodules in the left lung all stable since the last scan on 12/15/2017.  Results were reviewed with him today. -It is recommended that we  will repeat a CT scan in 18 to 24 months from the 05/24/2018 scan. -Orders placed for CT scan without contrast within the next month.  3.  Health maintenance: - Last colonoscopy was done on 04/22/2015 it was negative other than diverticulosis. - He will follow-up with GI as  recommended.     No problem-specific Assessment & Plan notes found for this encounter.      Orders placed this encounter:  Orders Placed This Encounter  Procedures   CT Chest Wo Contrast   Faythe Casa, NP 01/24/2020 1:24 PM  [place] [phone]

## 2020-02-25 ENCOUNTER — Ambulatory Visit (HOSPITAL_COMMUNITY): Payer: Medicare HMO

## 2020-05-23 ENCOUNTER — Other Ambulatory Visit (HOSPITAL_COMMUNITY): Payer: Self-pay

## 2020-05-23 DIAGNOSIS — D61818 Other pancytopenia: Secondary | ICD-10-CM

## 2020-05-23 DIAGNOSIS — R911 Solitary pulmonary nodule: Secondary | ICD-10-CM

## 2020-05-26 ENCOUNTER — Inpatient Hospital Stay (HOSPITAL_COMMUNITY): Payer: Medicare HMO | Attending: Hematology

## 2020-05-26 ENCOUNTER — Inpatient Hospital Stay (HOSPITAL_COMMUNITY): Payer: Medicare HMO | Admitting: Hematology

## 2020-12-03 DIAGNOSIS — M6281 Muscle weakness (generalized): Secondary | ICD-10-CM | POA: Diagnosis not present

## 2020-12-03 DIAGNOSIS — R5381 Other malaise: Secondary | ICD-10-CM | POA: Diagnosis not present

## 2020-12-04 DIAGNOSIS — M6281 Muscle weakness (generalized): Secondary | ICD-10-CM | POA: Diagnosis not present

## 2020-12-04 DIAGNOSIS — R5381 Other malaise: Secondary | ICD-10-CM | POA: Diagnosis not present

## 2020-12-05 DIAGNOSIS — R5381 Other malaise: Secondary | ICD-10-CM | POA: Diagnosis not present

## 2020-12-05 DIAGNOSIS — M6281 Muscle weakness (generalized): Secondary | ICD-10-CM | POA: Diagnosis not present

## 2020-12-08 DIAGNOSIS — M6281 Muscle weakness (generalized): Secondary | ICD-10-CM | POA: Diagnosis not present

## 2020-12-08 DIAGNOSIS — R5381 Other malaise: Secondary | ICD-10-CM | POA: Diagnosis not present

## 2020-12-09 DIAGNOSIS — M6281 Muscle weakness (generalized): Secondary | ICD-10-CM | POA: Diagnosis not present

## 2020-12-09 DIAGNOSIS — R5381 Other malaise: Secondary | ICD-10-CM | POA: Diagnosis not present

## 2020-12-10 DIAGNOSIS — R5381 Other malaise: Secondary | ICD-10-CM | POA: Diagnosis not present

## 2020-12-10 DIAGNOSIS — M6281 Muscle weakness (generalized): Secondary | ICD-10-CM | POA: Diagnosis not present

## 2020-12-12 DIAGNOSIS — R5381 Other malaise: Secondary | ICD-10-CM | POA: Diagnosis not present

## 2020-12-12 DIAGNOSIS — M6281 Muscle weakness (generalized): Secondary | ICD-10-CM | POA: Diagnosis not present

## 2020-12-13 DIAGNOSIS — M6281 Muscle weakness (generalized): Secondary | ICD-10-CM | POA: Diagnosis not present

## 2020-12-13 DIAGNOSIS — R5381 Other malaise: Secondary | ICD-10-CM | POA: Diagnosis not present

## 2020-12-15 DIAGNOSIS — M6281 Muscle weakness (generalized): Secondary | ICD-10-CM | POA: Diagnosis not present

## 2020-12-15 DIAGNOSIS — R5381 Other malaise: Secondary | ICD-10-CM | POA: Diagnosis not present

## 2020-12-16 DIAGNOSIS — M6281 Muscle weakness (generalized): Secondary | ICD-10-CM | POA: Diagnosis not present

## 2020-12-16 DIAGNOSIS — R5381 Other malaise: Secondary | ICD-10-CM | POA: Diagnosis not present

## 2020-12-17 DIAGNOSIS — M6281 Muscle weakness (generalized): Secondary | ICD-10-CM | POA: Diagnosis not present

## 2020-12-17 DIAGNOSIS — R5381 Other malaise: Secondary | ICD-10-CM | POA: Diagnosis not present

## 2020-12-18 DIAGNOSIS — R5381 Other malaise: Secondary | ICD-10-CM | POA: Diagnosis not present

## 2020-12-18 DIAGNOSIS — M6281 Muscle weakness (generalized): Secondary | ICD-10-CM | POA: Diagnosis not present

## 2020-12-22 DIAGNOSIS — R5381 Other malaise: Secondary | ICD-10-CM | POA: Diagnosis not present

## 2020-12-22 DIAGNOSIS — M6281 Muscle weakness (generalized): Secondary | ICD-10-CM | POA: Diagnosis not present

## 2020-12-23 DIAGNOSIS — M6281 Muscle weakness (generalized): Secondary | ICD-10-CM | POA: Diagnosis not present

## 2020-12-23 DIAGNOSIS — R5381 Other malaise: Secondary | ICD-10-CM | POA: Diagnosis not present

## 2020-12-24 DIAGNOSIS — M6281 Muscle weakness (generalized): Secondary | ICD-10-CM | POA: Diagnosis not present

## 2020-12-24 DIAGNOSIS — R5381 Other malaise: Secondary | ICD-10-CM | POA: Diagnosis not present

## 2020-12-25 DIAGNOSIS — M6281 Muscle weakness (generalized): Secondary | ICD-10-CM | POA: Diagnosis not present

## 2020-12-25 DIAGNOSIS — R5381 Other malaise: Secondary | ICD-10-CM | POA: Diagnosis not present

## 2020-12-26 DIAGNOSIS — M6281 Muscle weakness (generalized): Secondary | ICD-10-CM | POA: Diagnosis not present

## 2020-12-26 DIAGNOSIS — R5381 Other malaise: Secondary | ICD-10-CM | POA: Diagnosis not present

## 2020-12-29 DIAGNOSIS — R5381 Other malaise: Secondary | ICD-10-CM | POA: Diagnosis not present

## 2020-12-29 DIAGNOSIS — M6281 Muscle weakness (generalized): Secondary | ICD-10-CM | POA: Diagnosis not present

## 2020-12-31 DIAGNOSIS — R5381 Other malaise: Secondary | ICD-10-CM | POA: Diagnosis not present

## 2020-12-31 DIAGNOSIS — M6281 Muscle weakness (generalized): Secondary | ICD-10-CM | POA: Diagnosis not present

## 2021-01-02 DIAGNOSIS — R5381 Other malaise: Secondary | ICD-10-CM | POA: Diagnosis not present

## 2021-01-02 DIAGNOSIS — M6281 Muscle weakness (generalized): Secondary | ICD-10-CM | POA: Diagnosis not present

## 2022-12-23 ENCOUNTER — Encounter (HOSPITAL_COMMUNITY): Payer: Self-pay

## 2022-12-23 ENCOUNTER — Other Ambulatory Visit: Payer: Self-pay

## 2022-12-23 ENCOUNTER — Emergency Department (HOSPITAL_COMMUNITY)
Admission: EM | Admit: 2022-12-23 | Discharge: 2022-12-23 | Disposition: A | Discharge status: Home / Self Care | Payer: Medicare Other | Attending: Emergency Medicine | Admitting: Emergency Medicine

## 2022-12-23 DIAGNOSIS — K0889 Other specified disorders of teeth and supporting structures: Secondary | ICD-10-CM | POA: Insufficient documentation

## 2022-12-23 DIAGNOSIS — K08409 Partial loss of teeth, unspecified cause, unspecified class: Secondary | ICD-10-CM

## 2022-12-23 DIAGNOSIS — R58 Hemorrhage, not elsewhere classified: Secondary | ICD-10-CM

## 2022-12-23 DIAGNOSIS — Z79899 Other long term (current) drug therapy: Secondary | ICD-10-CM | POA: Diagnosis not present

## 2022-12-23 DIAGNOSIS — Z7901 Long term (current) use of anticoagulants: Secondary | ICD-10-CM | POA: Diagnosis not present

## 2022-12-23 DIAGNOSIS — E039 Hypothyroidism, unspecified: Secondary | ICD-10-CM | POA: Insufficient documentation

## 2022-12-23 DIAGNOSIS — I10 Essential (primary) hypertension: Secondary | ICD-10-CM | POA: Insufficient documentation

## 2022-12-23 LAB — BASIC METABOLIC PANEL
Anion gap: 15 (ref 5–15)
BUN: 10 mg/dL (ref 8–23)
CO2: 31 mmol/L (ref 22–32)
Calcium: 8.2 mg/dL — ABNORMAL LOW (ref 8.9–10.3)
Chloride: 105 mmol/L (ref 98–111)
Creatinine, Ser: 0.71 mg/dL (ref 0.61–1.24)
GFR, Estimated: >60 mL/min (ref >60)
Glucose, Bld: 85 mg/dL (ref 70–99)
Potassium: 2.8 mmol/L — ABNORMAL LOW (ref 3.5–5.1)
Sodium: 151 mmol/L — ABNORMAL HIGH (ref 135–145)

## 2022-12-23 LAB — CBC
HCT: 34.7 % — ABNORMAL LOW (ref 39.0–52.0)
Hemoglobin: 10.4 g/dL — ABNORMAL LOW (ref 13.0–17.0)
MCH: 31 pg (ref 26.0–34.0)
MCHC: 30 g/dL (ref 30.0–36.0)
MCV: 103.3 fL — ABNORMAL HIGH (ref 80.0–100.0)
Platelets: 67 10*3/uL — ABNORMAL LOW (ref 150–400)
RBC: 3.36 MIL/uL — ABNORMAL LOW (ref 4.22–5.81)
RDW: 18.2 % — ABNORMAL HIGH (ref 11.5–15.5)
WBC: 2.4 10*3/uL — ABNORMAL LOW (ref 4.0–10.5)
nRBC: 0 % (ref 0.0–0.2)

## 2022-12-23 LAB — PROTIME-INR
INR: 2.7 — ABNORMAL HIGH (ref 0.8–1.2)
Prothrombin Time: 29.2 s — ABNORMAL HIGH (ref 11.4–15.2)

## 2022-12-23 MED ORDER — SODIUM CHLORIDE 0.9 % IV SOLN: INTRAVENOUS | Status: DC

## 2022-12-23 NOTE — ED Notes (Signed)
Pt contracted with lean to the right. Was repositioned onto back by placing two pillows across his right side.

## 2022-12-23 NOTE — Discharge Instructions (Addendum)
Please stop taking the Pradaxa for 3 days, then restart it,  Your blood levels are actually higher than they usually are, there has been no significant bleeding here, this will gradually get better and stop, return to the ER for severe or worsening symptoms  You should see your dentist if there is ongoing bleeding  Dentist on Monday for follow up.

## 2022-12-23 NOTE — ED Notes (Signed)
Assuming care from previous provider. Pt is currently in the hallway awaiting transport back to SNF. Per previous provider report given to [facility]. New oxygen tank provided for pt. VS updated.

## 2022-12-23 NOTE — ED Notes (Signed)
Report given to [place] at [facility]. AVS summary returning with pt. Informed pt has code status DNR. There is no DNR form at bedside with pt, therefore there is no form to send back with pt. Caregiver denies any additional questions

## 2022-12-23 NOTE — ED Provider Notes (Signed)
[facility] Provider Note   CSN: [id number] Arrival date & time: 12/23/22  1337     History  Chief Complaint  Patient presents with   Dental Problem    Jonatan Abila is a 62 y.o. male.  HPI   This patient is a 62 year old male, history of chronic contractures, bedbound and nonverbal, lives in a nursing facility.  Was sent here because of possibly having some bleeding from his mouth.  The patient cannot give any additional history, the report was that the patient had had some teeth extracted recently.  Into the medical record the patient is on Pradaxa for history of atrial fibrillation.  He had previously been on warfarin.  According to the medical record the patient had been seen in the past for both chronic anemia as well as pancytopenia, as well as a lung nodule.  He has a history of a prior stroke, A-fib, hypertension, hypothyroidism.  Home Medications Prior to Admission medications   Medication Sig Start Date End Date Taking? Authorizing Provider  acetaminophen (TYLENOL) 325 MG tablet Take 650 mg by mouth every 6 (six) hours as needed.    [provider]  ascorbic acid (VITAMIN C) 500 MG tablet Take 500 mg by mouth daily.    [provider]  carvedilol (COREG) 12.5 MG tablet Take 12.5 mg by mouth 2 (two) times daily with a meal.      [provider]  furosemide (LASIX) 20 MG tablet 40 mg.  06/20/14   [provider]  levothyroxine (SYNTHROID) 137 MCG tablet Take 137 mcg by mouth daily. 12/15/19   [provider]  lidocaine (LMX) 4 % cream Apply 1 application topically as needed.    [provider]  lisinopril (ZESTRIL) 5 MG tablet Take 5 mg by mouth daily. 11/14/18   [provider]  loratadine (CLARITIN) 10 MG tablet Take 10 mg by mouth daily.    [provider]  polyethylene glycol-electrolytes (NULYTELY/GOLYTELY) 420 G solution Take 4,000 mLs by mouth once.  04/02/15   Gelene Mink, NP  potassium chloride SA (K-DUR,KLOR-CON) 20 MEQ tablet Take 40 mEq by mouth 2 (two) times daily.     [provider]  rosuvastatin (CRESTOR) 20 MG tablet Take 20 mg by mouth at bedtime. 12/11/19   [provider]  VENTOLIN HFA 108 (90 Base) MCG/ACT inhaler  12/30/17   [provider]  warfarin (COUMADIN) 5 MG tablet Take 5 mg by mouth daily. 5 mg MWF; 7.5 mg Tuesday, Thurs, Sat, Sun    [provider]      Allergies    Patient has no known allergies.    Review of Systems   Review of Systems  Unable to perform ROS: Acuity of condition    Physical Exam Updated Vital Signs BP 92/61   Pulse (!) 106   Ht 1.753 m (5\' 9" )   Wt 114.8 kg   SpO2 98%   BMI 37.37 kg/m  Physical Exam Vitals and nursing note reviewed.  Constitutional:      General: He is not in acute distress.    Appearance: He is well-developed.  HENT:     Head: Normocephalic and atraumatic.     Mouth/Throat:     Pharynx: No oropharyngeal exudate.     Comments: Some dentition still present, left lower anterolateral teeth missing with small amount of blood on the gums, no active heavy bleeding Eyes:     General: No scleral icterus.  Right eye: No discharge.        Left eye: No discharge.     Conjunctiva/sclera: Conjunctivae normal.     Pupils: Pupils are equal, round, and reactive to light.  Neck:     Thyroid: No thyromegaly.     Vascular: No JVD.  Cardiovascular:     Rate and Rhythm: Normal rate and regular rhythm.     Heart sounds: Normal heart sounds. No murmur heard.    No friction rub. No gallop.  Pulmonary:     Effort: Pulmonary effort is normal. No respiratory distress.     Breath sounds: Normal breath sounds. No wheezing or rales.  Abdominal:     General: Bowel sounds are normal. There is no distension.     Palpations: Abdomen is soft. There is no mass.     Tenderness: There is no abdominal tenderness.  Musculoskeletal:        General:  No tenderness. Normal range of motion.     Cervical back: Normal range of motion and neck supple.     Right lower leg: No edema.     Left lower leg: No edema.     Comments: Contractures bilateral upper extremities  Lymphadenopathy:     Cervical: No cervical adenopathy.  Skin:    General: Skin is warm and dry.     Findings: No erythema or rash.  Neurological:     Mental Status: He is alert.     Coordination: Coordination normal.  Psychiatric:        Behavior: Behavior normal.     ED Results / Procedures / Treatments   Labs (all labs ordered are listed, but only abnormal results are displayed) Labs Reviewed  CBC - Abnormal; Notable for the following components:      Result Value   WBC 2.4 (*)    RBC 3.36 (*)    Hemoglobin 10.4 (*)    HCT 34.7 (*)    MCV 103.3 (*)    RDW 18.2 (*)    Platelets 67 (*)    All other components within normal limits  BASIC METABOLIC PANEL - Abnormal; Notable for the following components:   Sodium 151 (*)    Potassium 2.8 (*)    Calcium 8.2 (*)    All other components within normal limits  PROTIME-INR - Abnormal; Notable for the following components:   Prothrombin Time 29.2 (*)    INR 2.7 (*)    All other components within normal limits    EKG None  Radiology No results found.  Procedures Procedures    Medications Ordered in ED Medications  0.9 %  sodium chloride infusion (has no administration in time range)    ED Course/ Medical Decision Making/ A&P                                 Medical Decision Making Amount and/or Complexity of Data Reviewed Labs: ordered.  Risk Prescription drug management.   This patient is in a prior stroke, he is severely debilitated secondary to chronic medical conditions.  He presents with a small amount of bleeding but is anticoagulated, he used to be on warfarin, will check an INR, check a CBC, given a little bit of fluid, his blood pressure tends to run a little bit low and he is currently in  the 90s but not tachycardic.  There has been no further abnormal findings, his heart rate is right around  100, he has not had any symptoms since arrival.  He is not febrile or hypoxic, the bleeding in his mouth and has not continued, there is a minimal amount of small blood on the gums, this is not anything that needs to have an intervention or a blood transfusion or stabilizing care.  We will have him stop the Pradaxa for several days and then restart.  He will need to be seen by his dentist on Monday, this will be communicated to the nursing facility        Final Clinical Impression(s) / ED Diagnoses Final diagnoses:  S/P tooth extraction  Bleeding    Rx / DC Orders ED Discharge Orders     None         Eber Hong, MD 12/23/22 1517

## 2022-12-23 NOTE — ED Triage Notes (Signed)
Pt brought in by [facility] for bleeding from recent dental surgery.

## 2023-01-18 DEATH — deceased
